# Patient Record
Sex: Female | Born: 1950 | Race: White | Hispanic: No
Health system: Midwestern US, Community
[De-identification: ages and names within clinical notes are randomized; demographics above are authoritative.]

## PROBLEM LIST (undated history)

## (undated) DIAGNOSIS — M479 Spondylosis, unspecified: Secondary | ICD-10-CM

## (undated) DIAGNOSIS — E7800 Pure hypercholesterolemia, unspecified: Secondary | ICD-10-CM

## (undated) DIAGNOSIS — I6529 Occlusion and stenosis of unspecified carotid artery: Secondary | ICD-10-CM

## (undated) DIAGNOSIS — F329 Major depressive disorder, single episode, unspecified: Secondary | ICD-10-CM

## (undated) DIAGNOSIS — I10 Essential (primary) hypertension: Secondary | ICD-10-CM

## (undated) DIAGNOSIS — R011 Cardiac murmur, unspecified: Secondary | ICD-10-CM

## (undated) DIAGNOSIS — F32A Depression, unspecified: Secondary | ICD-10-CM

## (undated) DIAGNOSIS — E079 Disorder of thyroid, unspecified: Secondary | ICD-10-CM

## (undated) DIAGNOSIS — K219 Gastro-esophageal reflux disease without esophagitis: Secondary | ICD-10-CM

## (undated) DIAGNOSIS — E785 Hyperlipidemia, unspecified: Secondary | ICD-10-CM

## (undated) HISTORY — DX: Occlusion and stenosis of unspecified carotid artery: I65.29

## (undated) HISTORY — DX: Hyperlipidemia, unspecified: E78.5

## (undated) HISTORY — DX: Pure hypercholesterolemia, unspecified: E78.00

## (undated) HISTORY — DX: Depression, unspecified: F32.A

## (undated) HISTORY — PX: OTHER SURGICAL HISTORY: SHX169

## (undated) HISTORY — PX: CHOLECYSTECTOMY: SHX55

## (undated) HISTORY — DX: Cardiac murmur, unspecified: R01.1

## (undated) HISTORY — DX: Major depressive disorder, single episode, unspecified: F32.9

## (undated) HISTORY — DX: Spondylosis, unspecified: M47.9

## (undated) HISTORY — PX: FRACTURE SURGERY: SHX138

## (undated) HISTORY — DX: Essential (primary) hypertension: I10

## (undated) HISTORY — PX: TONSILLECTOMY: SUR1361

## (undated) HISTORY — DX: Gastro-esophageal reflux disease without esophagitis: K21.9

## (undated) HISTORY — PX: APPENDECTOMY: SHX54

## (undated) HISTORY — DX: Disorder of thyroid, unspecified: E07.9

## (undated) HISTORY — PX: ADENOIDECTOMY: SUR15

---

## 1983-03-24 HISTORY — PX: ABDOMINAL HYSTERECTOMY: SHX81

## 2011-10-23 ENCOUNTER — Ambulatory Visit: Payer: Self-pay

## 2012-06-10 ENCOUNTER — Emergency Department: Payer: Self-pay | Admitting: Emergency Medicine

## 2012-06-10 LAB — BASIC METABOLIC PANEL
Chloride: 100 mmol/L (ref 98–107)
Co2: 29 mmol/L (ref 21–32)
EGFR (African American): >60
EGFR (Non-African Amer.): >60
Osmolality: 279 (ref 275–301)
Sodium: 137 mmol/L (ref 136–145)

## 2012-10-31 ENCOUNTER — Encounter: Payer: Self-pay | Admitting: Internal Medicine

## 2012-10-31 ENCOUNTER — Ambulatory Visit (INDEPENDENT_AMBULATORY_CARE_PROVIDER_SITE_OTHER): Payer: Self-pay | Admitting: Internal Medicine

## 2012-10-31 VITALS — BP 146/106 | HR 81 | Temp 98.3°F | Ht 59.0 in | Wt 161.0 lb

## 2012-10-31 DIAGNOSIS — M79659 Pain in unspecified thigh: Secondary | ICD-10-CM | POA: Insufficient documentation

## 2012-10-31 DIAGNOSIS — I10 Essential (primary) hypertension: Secondary | ICD-10-CM | POA: Insufficient documentation

## 2012-10-31 DIAGNOSIS — M79609 Pain in unspecified limb: Secondary | ICD-10-CM

## 2012-10-31 DIAGNOSIS — M542 Cervicalgia: Secondary | ICD-10-CM

## 2012-10-31 MED ORDER — AMLODIPINE BESYLATE 5 MG PO TABS: 5.0000 mg | ORAL_TABLET | Freq: Every day | ORAL | Status: DC

## 2012-10-31 MED ORDER — GABAPENTIN 100 MG PO CAPS: 100.0000 mg | ORAL_CAPSULE | Freq: Three times a day (TID) | ORAL | Status: DC

## 2012-10-31 NOTE — Assessment & Plan Note (Signed)
BP elevated. Pt has been off medications, unsure what she took in the past. Will start Amlodipine 5mg  daily. Will have her monitor BP at home. Discussed that it would be best to check renal function and urine microalbumin, however pt unable to afford labs. She will apply for [facility] for coverage for labs.

## 2012-10-31 NOTE — Assessment & Plan Note (Signed)
Bilateral thigh pain and paresthesia. History of degenerative disease of the spine. Will request previous records. Will start neurontin 100mg  po tid. Discussed additional testing including labs with B12, TSH, urine for heavy metals, however will hold off until pt has insurance coverage, as she cannot afford labs at this time.

## 2012-10-31 NOTE — Assessment & Plan Note (Signed)
Right sided neck pain and fullness over the last 3 years. Will try to obtain previous imaging. Recommended repeat CT neck to evaluate fullness and pain. Pt would prefer to hold off because of cost of imaging.

## 2012-10-31 NOTE — Progress Notes (Signed)
Subjective:    Patient ID: Hannah [name], female    DOB: September 29, 1950, 62 y.o.   MRN: [id number]  HPI 62 year old female with history of hypertension, chronic pain in her thighs presents to establish care. She has not seen a physician in several years. She has been without health insurance. She was previously on both blood pressure and cholesterol medication. She has not recently been taken any medicine. She denies any headache, chest pain, palpitations.  She reports a chronic history of back pain secondary to degenerative arthritis with bulging disc. She was previously followed by neurosurgery. She has not been having burning pain and numbness across her anterior thighs bilaterally. She has some weakness in her upper legs. She has been taking Naprosyn without improvement.  She also notes a history of fullness and swelling in her right neck over the last 3 years. This was previously evaluated with imaging however it has been several years since her evaluation. She is unsure what the cause of the swelling was. The pain sometimes radiates up into her right ear. She does not have difficulty swallowing. Swelling has not changed over last 3 years.  Outpatient Encounter Prescriptions as of 10/31/2012  Medication Sig Dispense Refill  . naproxen sodium (ALEVE) 220 MG tablet Take 220 mg by mouth 2 (two) times daily with a meal.      . omeprazole (PRILOSEC OTC) 20 MG tablet Take 20 mg by mouth daily.       No facility-administered encounter medications on file as of 10/31/2012.   BP 146/106  Pulse 81  Temp(Src) 98.3 F (36.8 C) (Oral)  Ht 4\' 11"  (1.499 m)  Wt 161 lb (73.029 kg)  BMI 32.5 kg/m2  SpO2 95%  Review of Systems  Constitutional: Negative for fever, chills, appetite change, fatigue and unexpected weight change.  HENT: Negative for ear pain, congestion, sore throat, trouble swallowing, neck pain, voice change and sinus pressure.   Eyes: Negative for visual disturbance.  Respiratory: Negative  for cough, shortness of breath, wheezing and stridor.   Cardiovascular: Negative for chest pain, palpitations and leg swelling.  Gastrointestinal: Negative for nausea, vomiting, abdominal pain, diarrhea, constipation, blood in stool, abdominal distention and anal bleeding.  Genitourinary: Negative for dysuria and flank pain.  Musculoskeletal: Positive for myalgias and arthralgias. Negative for gait problem.  Skin: Negative for color change and rash.  Neurological: Negative for dizziness and headaches.  Hematological: Negative for adenopathy. Does not bruise/bleed easily.  Psychiatric/Behavioral: Negative for suicidal ideas, sleep disturbance and dysphoric mood. The patient is not nervous/anxious.        Objective:   Physical Exam  Constitutional: [name], place, and time. She appears well-developed and well-nourished. No distress.  HENT:  Head: Normocephalic and atraumatic.  Right Ear: External ear normal.  Left Ear: External ear normal.  Nose: Nose normal.  Mouth/Throat: Oropharynx is clear and moist. No oropharyngeal exudate.  Eyes: Conjunctivae are normal. Pupils are equal, round, and reactive to light. Right eye exhibits no discharge. Left eye exhibits no discharge. No scleral icterus.  Neck: Normal range of motion. Neck supple. No tracheal tenderness present. Carotid bruit is not present. No tracheal deviation present. No mass and no thyromegaly present.    Cardiovascular: Normal rate, regular rhythm, normal heart sounds and intact distal pulses.  Exam reveals no gallop and no friction rub.   No murmur heard. Pulmonary/Chest: Effort normal and breath sounds normal. No accessory muscle usage. Not tachypneic. No respiratory distress. She has no decreased breath  sounds. She has no wheezes. She has no rhonchi. She has no rales. She exhibits no tenderness.  Abdominal: Soft. Bowel sounds are normal. She exhibits no distension. There is no tenderness.  Musculoskeletal:  Normal range of motion. She exhibits no edema and no tenderness.       Right hip: She exhibits normal range of motion and normal strength.       Left hip: She exhibits normal range of motion and normal strength.  Lymphadenopathy:    She has no cervical adenopathy.  Neurological: She is alert and oriented to person, place, and time. She has normal strength. She displays no atrophy and no tremor. No cranial nerve deficit. She exhibits normal muscle tone. Coordination and gait normal.  Skin: Skin is warm and dry. No rash noted. She is not diaphoretic. No erythema. No pallor.  Psychiatric: She has a normal mood and affect. Her behavior is normal. Judgment and thought content normal.          Assessment & Plan:

## 2012-12-05 ENCOUNTER — Ambulatory Visit: Payer: Self-pay | Admitting: Internal Medicine

## 2013-04-05 ENCOUNTER — Other Ambulatory Visit: Payer: Self-pay

## 2013-04-10 ENCOUNTER — Other Ambulatory Visit (INDEPENDENT_AMBULATORY_CARE_PROVIDER_SITE_OTHER): Payer: BC Managed Care – PPO

## 2013-04-10 ENCOUNTER — Ambulatory Visit: Payer: Self-pay | Admitting: Internal Medicine

## 2013-04-10 DIAGNOSIS — M79609 Pain in unspecified limb: Secondary | ICD-10-CM

## 2013-04-10 DIAGNOSIS — I10 Essential (primary) hypertension: Secondary | ICD-10-CM

## 2013-04-10 LAB — COMPREHENSIVE METABOLIC PANEL
ALT: 28 U/L (ref 0–35)
AST: 21 U/L (ref 0–37)
Albumin: 4.4 g/dL (ref 3.5–5.2)
Alkaline Phosphatase: 105 U/L (ref 39–117)
BUN: 20 mg/dL (ref 6–23)
CALCIUM: 9.5 mg/dL (ref 8.4–10.5)
CHLORIDE: 102 meq/L (ref 96–112)
CO2: 30 meq/L (ref 19–32)
CREATININE: 1.1 mg/dL (ref 0.4–1.2)
GFR: 51.99 mL/min — AB (ref >60.00)
GLUCOSE: 89 mg/dL (ref 70–99)
Potassium: 4.6 mEq/L (ref 3.5–5.1)
Sodium: 141 mEq/L (ref 135–145)
Total Bilirubin: 0.7 mg/dL (ref 0.3–1.2)
Total Protein: 7.1 g/dL (ref 6.0–8.3)

## 2013-04-10 LAB — LIPID PANEL
Cholesterol: 218 mg/dL — ABNORMAL HIGH (ref 0–200)
HDL: 62.3 mg/dL (ref >39.00)
Total CHOL/HDL Ratio: 3
Triglycerides: 75 mg/dL (ref 0.0–149.0)
VLDL: 15 mg/dL (ref 0.0–40.0)

## 2013-04-10 LAB — CBC WITH DIFFERENTIAL/PLATELET
Basophils Absolute: 0 10*3/uL (ref 0.0–0.1)
Basophils Relative: 0.6 % (ref 0.0–3.0)
EOS PCT: 3.2 % (ref 0.0–5.0)
Eosinophils Absolute: 0.2 10*3/uL (ref 0.0–0.7)
HEMATOCRIT: 39.2 % (ref 36.0–46.0)
Hemoglobin: 13.3 g/dL (ref 12.0–15.0)
Lymphocytes Relative: 32 % (ref 12.0–46.0)
Lymphs Abs: 2.3 10*3/uL (ref 0.7–4.0)
MCHC: 34 g/dL (ref 30.0–36.0)
MCV: 89.8 fl (ref 78.0–100.0)
MONOS PCT: 7.1 % (ref 3.0–12.0)
Monocytes Absolute: 0.5 10*3/uL (ref 0.1–1.0)
NEUTROS PCT: 57.1 % (ref 43.0–77.0)
Neutro Abs: 4.1 10*3/uL (ref 1.4–7.7)
Platelets: 321 10*3/uL (ref 150.0–400.0)
RBC: 4.36 Mil/uL (ref 3.87–5.11)
RDW: 14 % (ref 11.5–14.6)
WBC: 7.1 10*3/uL (ref 4.5–10.5)

## 2013-04-10 LAB — LDL CHOLESTEROL, DIRECT: LDL DIRECT: 137.9 mg/dL

## 2013-04-10 LAB — MICROALBUMIN / CREATININE URINE RATIO
Microalb Creat Ratio: 0.5 mg/g (ref 0.0–30.0)
Microalb, Ur: 3.2 mg/dL — ABNORMAL HIGH (ref 0.0–1.9)

## 2013-04-10 LAB — VITAMIN B12: VITAMIN B 12: 358 pg/mL (ref 211–911)

## 2013-04-10 LAB — C-REACTIVE PROTEIN: CRP: <0.5 mg/dL (ref 0.5–20.0)

## 2013-04-10 LAB — SEDIMENTATION RATE: SED RATE: 19 mm/h (ref 0–22)

## 2013-04-10 LAB — TSH: TSH: 1.16 u[IU]/mL (ref 0.35–5.50)

## 2013-04-11 LAB — ANTI-NUCLEAR AB-TITER (ANA TITER): ANA TITER 1: NEGATIVE (ref <1:40)

## 2013-04-11 LAB — RHEUMATOID FACTOR

## 2013-04-11 LAB — ANA: ANA: POSITIVE — AB

## 2013-04-14 ENCOUNTER — Encounter: Payer: Self-pay | Admitting: Internal Medicine

## 2013-04-14 ENCOUNTER — Encounter (INDEPENDENT_AMBULATORY_CARE_PROVIDER_SITE_OTHER): Payer: Self-pay

## 2013-04-14 ENCOUNTER — Ambulatory Visit (INDEPENDENT_AMBULATORY_CARE_PROVIDER_SITE_OTHER): Payer: BC Managed Care – PPO | Admitting: Internal Medicine

## 2013-04-14 VITALS — BP 120/78 | HR 75 | Temp 98.1°F | Wt 160.0 lb

## 2013-04-14 DIAGNOSIS — F411 Generalized anxiety disorder: Secondary | ICD-10-CM

## 2013-04-14 DIAGNOSIS — M79609 Pain in unspecified limb: Secondary | ICD-10-CM

## 2013-04-14 DIAGNOSIS — R41840 Attention and concentration deficit: Secondary | ICD-10-CM

## 2013-04-14 DIAGNOSIS — I10 Essential (primary) hypertension: Secondary | ICD-10-CM

## 2013-04-14 DIAGNOSIS — M79659 Pain in unspecified thigh: Secondary | ICD-10-CM

## 2013-04-14 MED ORDER — ZOSTER VACCINE LIVE 19400 UNT/0.65ML ~~LOC~~ SOLR: 0.6500 mL | Freq: Once | SUBCUTANEOUS | Status: DC

## 2013-04-14 MED ORDER — AMLODIPINE BESYLATE 5 MG PO TABS: 5.0000 mg | ORAL_TABLET | Freq: Every day | ORAL | Status: DC

## 2013-04-14 MED ORDER — GABAPENTIN 100 MG PO CAPS: 100.0000 mg | ORAL_CAPSULE | Freq: Three times a day (TID) | ORAL | Status: DC

## 2013-04-14 NOTE — Progress Notes (Signed)
Pre-visit discussion using our clinic review tool. No additional management support is needed unless otherwise documented below in the visit note.  

## 2013-04-15 DIAGNOSIS — F411 Generalized anxiety disorder: Secondary | ICD-10-CM | POA: Insufficient documentation

## 2013-04-15 DIAGNOSIS — R41840 Attention and concentration deficit: Secondary | ICD-10-CM | POA: Insufficient documentation

## 2013-04-15 NOTE — Assessment & Plan Note (Signed)
Chronic pain and paresthesias of right thigh. Lab evaluation including CBC, CMP, B12, TSH were normal. Will again request the report on MRI of lumbar spine that was performed by her previous physician. She has not been taking Neurontin. Encouraged her to restart Neurontin 100 mg 3 times daily. Will set up urology evaluation. Question if the EMG testing might be helpful to determine etiology of pain symptoms.

## 2013-04-15 NOTE — Assessment & Plan Note (Signed)
Patient with history of anxiety, previously treated with Valium. Given her age we discussed potential risk of benzodiazepines. She reports no improvement with SSRIs in the past. Will set up psychiatry evaluation to consider alternative medication which might be helpful in treating anxiety symptoms.

## 2013-04-15 NOTE — Assessment & Plan Note (Signed)
Patient notes symptoms of difficulty concentrating. Suspect this is related to severe anxiety. Will set up a psychiatric evaluation with possible testing for ADD.

## 2013-04-15 NOTE — Progress Notes (Signed)
Subjective:    Patient ID: Hannah [name], female    DOB: [birth year], 62 y.o.   MRN: [id number]  HPI 63 year old female with history of hypertension, anxiety, and chronic right thigh pain presents for followup. She recently had labs performed which showed normal CBC, CMP, B12, thyroid function. She did have MRI of lumbar spine in the past to evaluate cause of right eye pain however results of that study are not available. She reports continued aching pain down her right thigh with occasional tingling or paresthesias over her right thigh. She denies weakness in her legs, loss of control of bowel or bladder. These symptoms of right thigh pain have been ongoing for a couple of years. She was evaluated by sports medicine in the past but did not find this helpful.  She is also concerned about symptoms of anxiety. She reports in the past she was taking Valium several times per day with some improvement. She has not recently been on any medication. She also used SSRIs in the past however had no improvement with this. She reports symptoms of anxious mood and difficulty concentrating or focusing.  Outpatient Encounter Prescriptions as of 04/14/2013  Medication Sig  . amLODipine (NORVASC) 5 MG tablet Take 1 tablet (5 mg total) by mouth daily.  . naproxen sodium (ALEVE) 220 MG tablet Take 220 mg by mouth 2 (two) times daily with a meal.  . ranitidine (ZANTAC) 150 MG capsule Take 150 mg by mouth 4 (four) times daily.  . [DISCONTINUED] amLODipine (NORVASC) 5 MG tablet Take 1 tablet (5 mg total) by mouth daily.  Marland Kitchen gabapentin (NEURONTIN) 100 MG capsule Take 1 capsule (100 mg total) by mouth 3 (three) times daily.  Marland Kitchen omeprazole (PRILOSEC OTC) 20 MG tablet Take 20 mg by mouth daily.  Marland Kitchen zoster vaccine live, PF, (ZOSTAVAX) 18299 UNT/0.65ML injection Inject 19,400 Units into the skin once.  . [DISCONTINUED] gabapentin (NEURONTIN) 100 MG capsule Take 1 capsule (100 mg total) by mouth 3 (three) times daily.   BP  120/78  Pulse 75  Temp(Src) 98.1 F (36.7 C) (Oral)  Wt 160 lb (72.576 kg)  SpO2 96%  Review of Systems  Constitutional: Negative for fever, chills, appetite change, fatigue and unexpected weight change.  HENT: Negative for congestion, ear pain, sinus pressure, sore throat, trouble swallowing and voice change.   Eyes: Negative for visual disturbance.  Respiratory: Negative for cough, shortness of breath, wheezing and stridor.   Cardiovascular: Negative for chest pain, palpitations and leg swelling.  Gastrointestinal: Negative for nausea, vomiting, abdominal pain, diarrhea, constipation, blood in stool, abdominal distention and anal bleeding.  Genitourinary: Negative for dysuria and flank pain.  Musculoskeletal: Positive for myalgias. Negative for arthralgias, gait problem and neck pain.  Skin: Negative for color change and rash.  Neurological: Negative for dizziness, tremors, weakness and headaches.  Hematological: Negative for adenopathy. Does not bruise/bleed easily.  Psychiatric/Behavioral: Positive for behavioral problems and decreased concentration. Negative for suicidal ideas, sleep disturbance and dysphoric mood. The patient is nervous/anxious.        Objective:   Physical Exam  Constitutional: [name], place, and time. She appears well-developed and well-nourished. No distress.  HENT:  Head: Normocephalic and atraumatic.  Right Ear: External ear normal.  Left Ear: External ear normal.  Nose: Nose normal.  Mouth/Throat: Oropharynx is clear and moist. No oropharyngeal exudate.  Eyes: Conjunctivae are normal. Pupils are equal, round, and reactive to light. Right eye exhibits no discharge. Left eye exhibits no discharge.  No scleral icterus.  Neck: Normal range of motion. Neck supple. No tracheal deviation present. No thyromegaly present.  Cardiovascular: Normal rate, regular rhythm, normal heart sounds and intact distal pulses.  Exam reveals no gallop and no  friction rub.   No murmur heard. Pulmonary/Chest: Effort normal and breath sounds normal. No accessory muscle usage. Not tachypneic. No respiratory distress. She has no decreased breath sounds. She has no wheezes. She has no rhonchi. She has no rales. She exhibits no tenderness.  Musculoskeletal: Normal range of motion. She exhibits no edema and no tenderness.       Right upper leg: She exhibits no tenderness, no bony tenderness, no swelling and no deformity.       Legs: Lymphadenopathy:    She has no cervical adenopathy.  Neurological: She is alert and oriented to person, place, and time. No cranial nerve deficit. She exhibits normal muscle tone. Coordination normal.  Skin: Skin is warm and dry. No rash noted. She is not diaphoretic. No erythema. No pallor.  Psychiatric: Her speech is normal and behavior is normal. Judgment and thought content normal. Her mood appears anxious. Cognition and memory are normal.          Assessment & Plan:

## 2013-04-15 NOTE — Assessment & Plan Note (Signed)
BP Readings from Last 3 Encounters:  04/14/13 120/78  10/31/12 146/106   BP well controlled on amlodipine. Will continue.

## 2013-04-26 ENCOUNTER — Telehealth: Payer: Self-pay | Admitting: Internal Medicine

## 2013-04-26 NOTE — Telephone Encounter (Signed)
Relevant patient education assigned to patient using Emmi. ° °

## 2013-05-12 ENCOUNTER — Ambulatory Visit: Payer: BC Managed Care – PPO | Admitting: Internal Medicine

## 2013-05-15 ENCOUNTER — Telehealth: Payer: Self-pay | Admitting: Internal Medicine

## 2013-05-15 NOTE — Telephone Encounter (Signed)
Spoke with patient informed to call Dr. Lannie Fields office since he originally prescribed it to her. Patient verbalized understanding. While on the phone patient inquired about seeing another provider in the office for her follow up since her sister is coming in on 3/10 and Dr. Gilford Rile is not in the office that day. Informed patient other providers only does that for acute visits not follow ups.

## 2013-05-15 NOTE — Telephone Encounter (Signed)
Pt called  She was referred to dr Melrose Nakayama.  Dr Melrose Nakayama prescribe her diclofen pat  50mg   It states Do not take aleive or ibprofen  . Pt stopped taken diclofen she stated it was doing no good she started taken aleive again. Pt stated she has not called dr Weber Cooks about this  She wanted to know if she needed to call dr Weber Cooks office

## 2013-06-15 ENCOUNTER — Telehealth: Payer: Self-pay | Admitting: *Deleted

## 2013-06-15 NOTE — Telephone Encounter (Signed)
I reviewed notes from neurology. Did she follow up again with them after testing? I would recommend a follow up with them. If no improvement, we can also consider referral to pain management.

## 2013-06-15 NOTE — Telephone Encounter (Signed)
Patient left voicemail message stating she needs a follow up appointment because she is still in a lot of pain. The meds is not working and the doctor you referred her to did not find anything, so what is the next step?

## 2013-06-16 NOTE — Telephone Encounter (Signed)
Left message to call back  

## 2013-06-19 NOTE — Telephone Encounter (Signed)
Called and spoke with patient, she stated she just spoken to the neurologist office. They put her on Gabapentin 300 mg she takes 1 in the morning, 1 in the afternoon and 2 at night. She is going to see how well this works for her.

## 2013-06-21 ENCOUNTER — Telehealth: Payer: Self-pay | Admitting: Internal Medicine

## 2013-06-21 NOTE — Telephone Encounter (Signed)
Patient left voicemail requesting something called in for ringing in the ears.

## 2013-06-22 NOTE — Telephone Encounter (Signed)
Called and spoken with patient, she state she has a ringing in her ears and this is something she has always had. But over the last few weeks it has gotten worse and hard to ignore. She will come in for an appointment if nothing can be called in.

## 2013-06-22 NOTE — Telephone Encounter (Signed)
Spoke with pt, does not feel this is urgent or overly bothersome currently. Has had this for years and is tolerable until she can get an appt with Dr. Gilford Rile. Appt scheduled 07/14/13. Advised pt to call office with worsening or change in symptoms prior to this visit, verbalized understanding.

## 2013-06-22 NOTE — Telephone Encounter (Signed)
Yes, she needs to be evaluated in a visit. May need to come to Saturday clinic.

## 2013-06-30 ENCOUNTER — Telehealth: Payer: Self-pay | Admitting: Internal Medicine

## 2013-06-30 NOTE — Telephone Encounter (Signed)
FYI

## 2013-06-30 NOTE — Telephone Encounter (Signed)
Patient Information:  Caller Name: Glory  Phone: [phone]  Patient: Hannah [name], Hannah [name]  Gender: Female  DOB: 03/27/1950  Age: 63 Years  PCP: Ronette Deter (Adults only)  Office Follow Up:  Does the office need to follow up with this patient?: No  Instructions For The Office: N/A   Symptoms  Reason For Call & Symptoms: Patient calling, she had diarrhea starting on 4/5 and then flu like sx 4/9 and now today has diarrhea again.  She has had 3 or 4 diarrhea stools today.  No vomiting.  Temp 99.3.  She also has body aches, headaches and a sore throat.  States that the cough is worse and she had chest pain earlier with the coughing to the point "that I thought that I was going to pass out".  The pain is now gone.  The cough is nonproductive.  Reviewed Health History In EMR: Yes  Reviewed Medications In EMR: Yes  Reviewed Allergies In EMR: Yes  Reviewed Surgeries / Procedures: Yes  Date of Onset of Symptoms: 06/29/2013  Guideline(s) Used:  Influenza - Seasonal  Disposition Per Guideline:   Go to ED Now (or to Office with PCP Approval)  Reason For Disposition Reached:   Patient sounds very sick or weak to the triager  Advice Given:  N/A  Patient Will Follow Care Advice:  YES  Going to ED

## 2013-07-01 NOTE — Telephone Encounter (Signed)
Per note, pt going to ED.  Just FYI.

## 2013-07-14 ENCOUNTER — Encounter: Payer: Self-pay | Admitting: Internal Medicine

## 2013-07-14 ENCOUNTER — Ambulatory Visit (INDEPENDENT_AMBULATORY_CARE_PROVIDER_SITE_OTHER): Payer: BC Managed Care – PPO | Admitting: Internal Medicine

## 2013-07-14 VITALS — BP 110/74 | HR 76 | Temp 98.4°F | Wt 153.0 lb

## 2013-07-14 DIAGNOSIS — M79659 Pain in unspecified thigh: Secondary | ICD-10-CM

## 2013-07-14 DIAGNOSIS — H9319 Tinnitus, unspecified ear: Secondary | ICD-10-CM

## 2013-07-14 DIAGNOSIS — R269 Unspecified abnormalities of gait and mobility: Secondary | ICD-10-CM

## 2013-07-14 DIAGNOSIS — I10 Essential (primary) hypertension: Secondary | ICD-10-CM

## 2013-07-14 DIAGNOSIS — M79609 Pain in unspecified limb: Secondary | ICD-10-CM

## 2013-07-14 DIAGNOSIS — R2681 Unsteadiness on feet: Secondary | ICD-10-CM

## 2013-07-14 DIAGNOSIS — H9313 Tinnitus, bilateral: Secondary | ICD-10-CM

## 2013-07-14 LAB — COMPREHENSIVE METABOLIC PANEL
ALK PHOS: 85 U/L (ref 39–117)
ALT: 16 U/L (ref 0–35)
AST: 15 U/L (ref 0–37)
Albumin: 4.1 g/dL (ref 3.5–5.2)
BUN: 19 mg/dL (ref 6–23)
CO2: 27 mEq/L (ref 19–32)
Calcium: 9.8 mg/dL (ref 8.4–10.5)
Chloride: 100 mEq/L (ref 96–112)
Creatinine, Ser: 1.1 mg/dL (ref 0.4–1.2)
GFR: 53.59 mL/min — ABNORMAL LOW (ref >60.00)
GLUCOSE: 78 mg/dL (ref 70–99)
Potassium: 5.1 mEq/L (ref 3.5–5.1)
SODIUM: 136 meq/L (ref 135–145)
TOTAL PROTEIN: 7.2 g/dL (ref 6.0–8.3)
Total Bilirubin: 0.7 mg/dL (ref 0.3–1.2)

## 2013-07-14 LAB — CBC WITH DIFFERENTIAL/PLATELET
BASOS ABS: 0.1 10*3/uL (ref 0.0–0.1)
Basophils Relative: 0.7 % (ref 0.0–3.0)
Eosinophils Absolute: 0.2 10*3/uL (ref 0.0–0.7)
Eosinophils Relative: 2.7 % (ref 0.0–5.0)
HEMATOCRIT: 38 % (ref 36.0–46.0)
Hemoglobin: 12.6 g/dL (ref 12.0–15.0)
LYMPHS ABS: 2.5 10*3/uL (ref 0.7–4.0)
LYMPHS PCT: 27.9 % (ref 12.0–46.0)
MCHC: 33.2 g/dL (ref 30.0–36.0)
MCV: 91.8 fl (ref 78.0–100.0)
Monocytes Absolute: 0.9 10*3/uL (ref 0.1–1.0)
Monocytes Relative: 9.7 % (ref 3.0–12.0)
NEUTROS PCT: 59 % (ref 43.0–77.0)
Neutro Abs: 5.2 10*3/uL (ref 1.4–7.7)
PLATELETS: 433 10*3/uL — AB (ref 150.0–400.0)
RBC: 4.14 Mil/uL (ref 3.87–5.11)
RDW: 14 % (ref 11.5–14.6)
WBC: 8.8 10*3/uL (ref 4.5–10.5)

## 2013-07-14 LAB — VITAMIN B12: VITAMIN B 12: 572 pg/mL (ref 211–911)

## 2013-07-14 MED ORDER — AMLODIPINE BESYLATE 5 MG PO TABS: 5.0000 mg | ORAL_TABLET | Freq: Every day | ORAL | Status: DC

## 2013-07-14 MED ORDER — ZOSTER VACCINE LIVE 19400 UNT/0.65ML ~~LOC~~ SOLR: 0.6500 mL | Freq: Once | SUBCUTANEOUS | Status: DC

## 2013-07-14 NOTE — Progress Notes (Signed)
Subjective:    Patient ID: Hannah [name] [name], female    DOB: May 27, 1950, 63 y.o.   MRN: [id number]  HPI 63YO female presents for acute visit.  Sick around [place] with diarrhea and malaise, weakness. Also had congestion, cough and left chest wall pain. Cough productive of colored sputum initially and then cleared. Had fever initially which improved.  Tinnitis - has always had this, but at times more severe. Sounds like static. High and low pitch. Years ago had hearing testing, more than 20 years ago. No hearing aids.  Having trouble walking because of thigh pain. Continues to have burning pain in right thigh. Taking Neurontin with no improvement. Reviewed notes from Dr. Melrose Nakayama who recommended EMG testing. This has not been scheduled.   Review of Systems  Constitutional: Positive for fatigue. Negative for fever, chills, appetite change and unexpected weight change.  HENT: Positive for tinnitus. Negative for congestion, ear pain, hearing loss, sinus pressure, sore throat, trouble swallowing and voice change.   Eyes: Negative for visual disturbance.  Respiratory: Negative for cough, shortness of breath, wheezing and stridor.   Cardiovascular: Negative for chest pain, palpitations and leg swelling.  Gastrointestinal: Negative for nausea, vomiting, abdominal pain, diarrhea, constipation, blood in stool, abdominal distention and anal bleeding.  Genitourinary: Negative for dysuria and flank pain.  Musculoskeletal: Positive for arthralgias and myalgias. Negative for gait problem and neck pain.  Skin: Negative for color change and rash.  Neurological: Positive for weakness. Negative for dizziness, numbness and headaches.  Hematological: Negative for adenopathy. Does not bruise/bleed easily.  Psychiatric/Behavioral: Negative for suicidal ideas, sleep disturbance and dysphoric mood. The patient is not nervous/anxious.        Objective:    BP 110/74  Pulse 76  Temp(Src) 98.4 F (36.9 C) (Oral)   Wt 153 lb (69.4 kg)  SpO2 94% Physical Exam  Constitutional: [name], place, and time. She appears well-developed and well-nourished. No distress.  HENT:  Head: Normocephalic and atraumatic.  Right Ear: Tympanic membrane, external ear and ear canal normal.  Left Ear: Tympanic membrane, external ear and ear canal normal.  Nose: Nose normal.  Mouth/Throat: Oropharynx is clear and moist. No oropharyngeal exudate.  Eyes: Conjunctivae are normal. Pupils are equal, round, and reactive to light. Right eye exhibits no discharge. Left eye exhibits no discharge. No scleral icterus.  Neck: Normal range of motion. Neck supple. No tracheal deviation present. No thyromegaly present.  Cardiovascular: Normal rate, regular rhythm, normal heart sounds and intact distal pulses.  Exam reveals no gallop and no friction rub.   No murmur heard. Pulmonary/Chest: Effort normal and breath sounds normal. No accessory muscle usage. Not tachypneic. No respiratory distress. She has no decreased breath sounds. She has no wheezes. She has no rhonchi. She has no rales. She exhibits no tenderness.  Musculoskeletal: Normal range of motion. She exhibits no edema.       Right hip: She exhibits tenderness. She exhibits normal range of motion and normal strength.       Legs: Lymphadenopathy:    She has no cervical adenopathy.  Neurological: She is alert and oriented to person, place, and time. No cranial nerve deficit. She exhibits normal muscle tone. Coordination normal.  Skin: Skin is warm and dry. No rash noted. She is not diaphoretic. No erythema. No pallor.  Psychiatric: She has a normal mood and affect. Her behavior is normal. Judgment and thought content normal.          Assessment & Plan:  Problem List Items Addressed This Visit   Essential hypertension, benign      BP Readings from Last 3 Encounters:  07/14/13 110/74  04/14/13 120/78  10/31/12 146/106   BP well controlled on Amlodipine.  Will continue.    Relevant Medications      amLODIpine (NORVASC) tablet   Gait instability     Gait instability noted with severe pain in right thigh. Rx written for [place] Maiorino and cane for falls prevention. Follow up with neurology as scheduled.    Thigh pain     Persistent right>left thigh pain described as burning. Reviewed notes from Neurology, who recommended EMG testing. Will have her schedule follow up with them for this. Continue Neurontin.    Relevant Orders      Heavy metals screen, urine      CBC with Differential      Methylmalonic Acid      B12      Comp Met (CMET)   Tinnitus of both ears - Primary     Unclear etiology ringing in both ears. Exam normal. Will set up ENT/audiology evaluation.    Relevant Orders      Ambulatory referral to ENT       Return in about 4 weeks (around 08/11/2013).

## 2013-07-14 NOTE — Patient Instructions (Signed)
Call Dr. Melrose Nakayama to schedule follow up for possible EMG testing to determine cause of right leg pain.  We will set up evaluation with ENT.  Follow up in 4 weeks.

## 2013-07-14 NOTE — Assessment & Plan Note (Signed)
Unclear etiology ringing in both ears. Exam normal. Will set up ENT/audiology evaluation.

## 2013-07-14 NOTE — Progress Notes (Signed)
Pre visit review using our clinic review tool, if applicable. No additional management support is needed unless otherwise documented below in the visit note. 

## 2013-07-14 NOTE — Assessment & Plan Note (Signed)
Gait instability noted with severe pain in right thigh. Rx written for walker and cane for falls prevention. Follow up with neurology as scheduled.

## 2013-07-14 NOTE — Assessment & Plan Note (Signed)
Persistent right>left thigh pain described as burning. Reviewed notes from Neurology, who recommended EMG testing. Will have her schedule follow up with them for this. Continue Neurontin.

## 2013-07-14 NOTE — Assessment & Plan Note (Signed)
BP Readings from Last 3 Encounters:  07/14/13 110/74  04/14/13 120/78  10/31/12 146/106   BP well controlled on Amlodipine. Will continue.

## 2013-07-17 ENCOUNTER — Encounter: Payer: Self-pay | Admitting: *Deleted

## 2013-07-18 ENCOUNTER — Encounter: Payer: Self-pay | Admitting: *Deleted

## 2013-07-18 LAB — METHYLMALONIC ACID, SERUM: Methylmalonic Acid, Quant: 263 nmol/L (ref 87–318)

## 2013-08-04 ENCOUNTER — Telehealth: Payer: Self-pay | Admitting: *Deleted

## 2013-08-04 NOTE — Telephone Encounter (Signed)
No. She should have 24hr urine.

## 2013-08-04 NOTE — Telephone Encounter (Signed)
Pt brought in a two urine samples, they only order i see if for the 24 hours urine? Are these samples for something different?

## 2013-08-08 DIAGNOSIS — R200 Anesthesia of skin: Secondary | ICD-10-CM | POA: Insufficient documentation

## 2013-08-08 DIAGNOSIS — R202 Paresthesia of skin: Secondary | ICD-10-CM | POA: Insufficient documentation

## 2013-08-08 DIAGNOSIS — G723 Periodic paralysis: Secondary | ICD-10-CM | POA: Insufficient documentation

## 2013-08-08 DIAGNOSIS — R531 Weakness: Secondary | ICD-10-CM | POA: Insufficient documentation

## 2013-08-08 DIAGNOSIS — M79609 Pain in unspecified limb: Secondary | ICD-10-CM | POA: Insufficient documentation

## 2013-08-08 DIAGNOSIS — R262 Difficulty in walking, not elsewhere classified: Secondary | ICD-10-CM | POA: Insufficient documentation

## 2013-08-11 DIAGNOSIS — M549 Dorsalgia, unspecified: Secondary | ICD-10-CM | POA: Insufficient documentation

## 2013-08-30 ENCOUNTER — Telehealth: Payer: Self-pay | Admitting: *Deleted

## 2013-08-30 NOTE — Telephone Encounter (Signed)
Pt in office today, asking about results from heavy metal urine test. Results not in system, spoke to [place], wrong urine container was given to patient so she needs to return to the office to pick up a 24 hour urine container so that the test can be performed. Left message, notifying pt to return to pick up correct container.

## 2013-09-08 ENCOUNTER — Encounter: Payer: BC Managed Care – PPO | Admitting: Internal Medicine

## 2013-12-05 ENCOUNTER — Ambulatory Visit (INDEPENDENT_AMBULATORY_CARE_PROVIDER_SITE_OTHER): Payer: Self-pay

## 2013-12-05 DIAGNOSIS — Z23 Encounter for immunization: Secondary | ICD-10-CM

## 2013-12-07 ENCOUNTER — Ambulatory Visit: Payer: Self-pay

## 2014-02-08 ENCOUNTER — Encounter: Payer: Self-pay | Admitting: Internal Medicine

## 2014-02-08 ENCOUNTER — Ambulatory Visit (INDEPENDENT_AMBULATORY_CARE_PROVIDER_SITE_OTHER): Payer: Self-pay | Admitting: Internal Medicine

## 2014-02-08 VITALS — BP 146/83 | Temp 98.6°F | Resp 83 | Ht 59.0 in | Wt 157.2 lb

## 2014-02-08 DIAGNOSIS — M79604 Pain in right leg: Secondary | ICD-10-CM

## 2014-02-08 DIAGNOSIS — Z1239 Encounter for other screening for malignant neoplasm of breast: Secondary | ICD-10-CM

## 2014-02-08 MED ORDER — GABAPENTIN 300 MG PO CAPS: 300.0000 mg | ORAL_CAPSULE | Freq: Three times a day (TID) | ORAL | Status: DC

## 2014-02-08 NOTE — Progress Notes (Signed)
Pre visit review using our clinic review tool, if applicable. No additional management support is needed unless otherwise documented below in the visit note. 

## 2014-02-08 NOTE — Progress Notes (Signed)
Subjective:    Patient ID: Hannah [name], female    DOB: 10/04/1950, 63 y.o.   MRN: [id number]  HPI 63YO female presents for follow up.  Having severe right leg pain. Described as burning pain. Sometimes so bad it makes her feel sick.Feels weak at times. Also having pain in right upper arm at times. Taking Aleve and Neurontin with no improvement. Tried to take Tramadol, but could not tolerate it. No loss of control of bowel or bladder.  Recently had biopsy of thyroid nodule which was benign.  Followed by Dr. Nicolasa Ducking. Depression symptoms well controlled.  Review of Systems  Constitutional: Negative for fever, chills, appetite change, fatigue and unexpected weight change.  Eyes: Negative for visual disturbance.  Respiratory: Negative for shortness of breath.   Cardiovascular: Negative for chest pain and leg swelling.  Gastrointestinal: Negative for abdominal pain.  Musculoskeletal: Positive for myalgias and arthralgias. Negative for back pain.  Skin: Negative for color change and rash.  Neurological: Positive for weakness and numbness.  Hematological: Negative for adenopathy. Does not bruise/bleed easily.  Psychiatric/Behavioral: Negative for dysphoric mood. The patient is not nervous/anxious.        Objective:    BP 146/83 mmHg  Temp(Src) 98.6 F (37 C) (Oral)  Resp 83  Ht 4\' 11"  (1.499 m)  Wt 157 lb 4 oz (71.328 kg)  BMI 31.74 kg/m2  SpO2 96% Physical Exam  Constitutional: [name], place, and time. She appears well-developed and well-nourished. No distress.  HENT:  Head: Normocephalic and atraumatic.  Right Ear: External ear normal.  Left Ear: External ear normal.  Nose: Nose normal.  Mouth/Throat: Oropharynx is clear and moist. No oropharyngeal exudate.  Eyes: Conjunctivae are normal. Pupils are equal, round, and reactive to light. Right eye exhibits no discharge. Left eye exhibits no discharge. No scleral icterus.  Neck: Normal range of motion.  Neck supple. No tracheal deviation present. No thyromegaly present.  Cardiovascular: Normal rate, regular rhythm, normal heart sounds and intact distal pulses.  Exam reveals no gallop and no friction rub.   No murmur heard. Pulmonary/Chest: Effort normal and breath sounds normal. No accessory muscle usage. No tachypnea. No respiratory distress. She has no decreased breath sounds. She has no wheezes. She has no rhonchi. She has no rales. She exhibits no tenderness.  Musculoskeletal: Normal range of motion. She exhibits no edema or tenderness.       Right hip: She exhibits normal range of motion, normal strength and no tenderness.  Knee flex/ext 4/5  Lymphadenopathy:    She has no cervical adenopathy.  Neurological: She is alert and oriented to person, place, and time. No cranial nerve deficit. She exhibits normal muscle tone. Coordination normal.  Skin: Skin is warm and dry. No rash noted. She is not diaphoretic. No erythema. No pallor.  Psychiatric: She has a normal mood and affect. Her behavior is normal. Judgment and thought content normal.          Assessment & Plan:   Problem List Items Addressed This Visit      Unprioritized   Right leg pain - Primary    Severe right leg pain likely secondary to bulging disc L5 (reported by Dr. Melrose Nakayama review of MRI lumbar spine). Will request copy of MRI. Will increase Neurontin to 300mg  tid. Will set up evaluation with pain management, as pt does not want to consider surgical intervention. Follow up here in 3 months and prn.    Relevant Orders  Ambulatory referral to Pain Clinic    Other Visit Diagnoses    Screening for breast cancer        Relevant Orders       MM Digital Screening        Return in about 3 months (around 05/11/2014).

## 2014-02-08 NOTE — Assessment & Plan Note (Signed)
Severe right leg pain likely secondary to bulging disc L5 (reported by Dr. Melrose Nakayama review of MRI lumbar spine). Will request copy of MRI. Will increase Neurontin to 300mg  tid. Will set up evaluation with pain management, as pt does not want to consider surgical intervention. Follow up here in 3 months and prn.

## 2014-02-08 NOTE — Patient Instructions (Addendum)
We will set up an evaluation with pain management.  Increase Neurontin to 300mg  three times daily.  Follow up in 3 months.

## 2014-03-27 ENCOUNTER — Ambulatory Visit: Payer: Self-pay | Admitting: Pain Medicine

## 2014-03-28 ENCOUNTER — Encounter: Payer: Self-pay | Admitting: *Deleted

## 2014-03-28 ENCOUNTER — Ambulatory Visit: Payer: Self-pay | Admitting: Internal Medicine

## 2014-03-28 LAB — HM MAMMOGRAPHY: HM Mammogram: NEGATIVE

## 2014-04-04 ENCOUNTER — Ambulatory Visit: Payer: Self-pay | Admitting: Pain Medicine

## 2014-04-25 ENCOUNTER — Encounter: Payer: Self-pay | Admitting: Internal Medicine

## 2014-05-03 ENCOUNTER — Ambulatory Visit: Payer: Self-pay | Admitting: Pain Medicine

## 2014-05-14 ENCOUNTER — Encounter (INDEPENDENT_AMBULATORY_CARE_PROVIDER_SITE_OTHER): Payer: Self-pay

## 2014-05-14 ENCOUNTER — Encounter: Payer: Self-pay | Admitting: Internal Medicine

## 2014-05-14 ENCOUNTER — Ambulatory Visit (INDEPENDENT_AMBULATORY_CARE_PROVIDER_SITE_OTHER): Payer: 59 | Admitting: Internal Medicine

## 2014-05-14 VITALS — BP 107/68 | HR 82 | Temp 98.4°F | Ht 59.0 in | Wt 159.1 lb

## 2014-05-14 DIAGNOSIS — M79604 Pain in right leg: Secondary | ICD-10-CM

## 2014-05-14 DIAGNOSIS — I10 Essential (primary) hypertension: Secondary | ICD-10-CM

## 2014-05-14 LAB — COMPREHENSIVE METABOLIC PANEL
ALT: 12 U/L (ref 0–35)
AST: 15 U/L (ref 0–37)
Albumin: 4.1 g/dL (ref 3.5–5.2)
Alkaline Phosphatase: 90 U/L (ref 39–117)
BUN: 20 mg/dL (ref 6–23)
CO2: 31 mEq/L (ref 19–32)
CREATININE: 1.19 mg/dL (ref 0.40–1.20)
Calcium: 9.5 mg/dL (ref 8.4–10.5)
Chloride: 104 mEq/L (ref 96–112)
GFR: 48.81 mL/min — ABNORMAL LOW (ref >60.00)
Glucose, Bld: 85 mg/dL (ref 70–99)
POTASSIUM: 4.9 meq/L (ref 3.5–5.1)
SODIUM: 140 meq/L (ref 135–145)
Total Bilirubin: 0.4 mg/dL (ref 0.2–1.2)
Total Protein: 6.5 g/dL (ref 6.0–8.3)

## 2014-05-14 LAB — HM PAP SMEAR

## 2014-05-14 MED ORDER — GABAPENTIN 300 MG PO CAPS: 600.0000 mg | ORAL_CAPSULE | Freq: Three times a day (TID) | ORAL | Status: DC

## 2014-05-14 MED ORDER — AMLODIPINE BESYLATE 5 MG PO TABS: 5.0000 mg | ORAL_TABLET | Freq: Every day | ORAL | Status: DC

## 2014-05-14 NOTE — Patient Instructions (Addendum)
Labs today.  Increase Neurontin to 600mg  three times daily. As you increase, please note that you may feel more drowsy.

## 2014-05-14 NOTE — Assessment & Plan Note (Signed)
BP Readings from Last 3 Encounters:  05/14/14 107/68  02/08/14 146/83  07/14/13 110/74   BP well controlled. Renal function with labs. Continue Amlodipine.

## 2014-05-14 NOTE — Progress Notes (Signed)
Subjective:    Patient ID: Hannah [name], female    DOB: 24-May-1950, 64 y.o.   MRN: [id number]  HPI  64YO female presents for follow up.  Last seen 01/2014 for right leg pain. We increased Neurontin to 300mg .  Seen by pain management. Had ESI with minimal improvement. Planning to have second injection this week with Dr. Primus Bravo. Continues to take Gabapentin, but no improvement in pain. Pain described as burning. Hurts in right groin and in knees. Leg feels like it will give out on her at times. No recent falls, however. Considering additional medication with Dr. Primus Bravo.  Past medical, surgical, family and social history per today's encounter.  Review of Systems  Constitutional: Negative for fever, chills, appetite change, fatigue and unexpected weight change.  Eyes: Negative for visual disturbance.  Respiratory: Negative for shortness of breath.   Cardiovascular: Negative for chest pain and leg swelling.  Gastrointestinal: Negative for abdominal pain.  Musculoskeletal: Positive for myalgias, back pain and arthralgias.  Skin: Negative for color change and rash.  Neurological: Positive for weakness. Negative for numbness.  Hematological: Negative for adenopathy. Does not bruise/bleed easily.  Psychiatric/Behavioral: Negative for dysphoric mood. The patient is not nervous/anxious.        Objective:    BP 107/68 mmHg  Pulse 82  Temp(Src) 98.4 F (36.9 C) (Oral)  Ht 4\' 11"  (1.499 m)  Wt 159 lb 2 oz (72.179 kg)  BMI 32.12 kg/m2  SpO2 95% Physical Exam  Constitutional: [name], place, and time. She appears well-developed and well-nourished. No distress.  HENT:  Head: Normocephalic and atraumatic.  Right Ear: External ear normal.  Left Ear: External ear normal.  Nose: Nose normal.  Mouth/Throat: Oropharynx is clear and moist. No oropharyngeal exudate.  Eyes: Conjunctivae are normal. Pupils are equal, round, and reactive to light. Right eye exhibits no  discharge. Left eye exhibits no discharge. No scleral icterus.  Neck: Normal range of motion. Neck supple. No tracheal deviation present. No thyromegaly present.  Cardiovascular: Normal rate, regular rhythm, normal heart sounds and intact distal pulses.  Exam reveals no gallop and no friction rub.   No murmur heard. Pulmonary/Chest: Effort normal and breath sounds normal. No respiratory distress. She has no wheezes. She has no rales. She exhibits no tenderness.  Musculoskeletal: Normal range of motion. She exhibits no edema or tenderness.       Right hip: She exhibits decreased strength (4/5). She exhibits normal range of motion.  Unsteady gait and weakness noted right leg, however exam limited by pain  Lymphadenopathy:    She has no cervical adenopathy.  Neurological: She is alert and oriented to person, place, and time. No cranial nerve deficit. She exhibits normal muscle tone. Coordination normal.  Skin: Skin is warm and dry. No rash noted. She is not diaphoretic. No erythema. No pallor.  Psychiatric: She has a normal mood and affect. Her behavior is normal. Judgment and thought content normal.          Assessment & Plan:   Problem List Items Addressed This Visit      Unprioritized   Essential hypertension, benign    BP Readings from Last 3 Encounters:  05/14/14 107/68  02/08/14 146/83  07/14/13 110/74   BP well controlled. Renal function with labs. Continue Amlodipine.      Relevant Medications   amLODIpine (NORVASC) tablet   Other Relevant Orders   Comprehensive metabolic panel   Right leg pain - Primary  Persistent right leg pain secondary to radiculopathy L5, based on previous MRI. S/p ESI x 1 with no improvement. Scheduled for second ESI this week. Will increase Neurontin to 600mg  po tid. Discussed risk of Neurontin including drowsiness. Follow up with Dr. Primus Bravo as scheduled.      Relevant Medications   gabapentin (NEURONTIN) capsule       Return in about 3  months (around 08/12/2014) for Physical.

## 2014-05-14 NOTE — Assessment & Plan Note (Addendum)
Persistent right leg pain secondary to radiculopathy L5, based on previous MRI. S/p ESI x 1 with no improvement. Scheduled for second ESI this week. Will increase Neurontin to 600mg  po tid. Discussed risk of Neurontin including drowsiness. Follow up with Dr. Primus Bravo as scheduled.

## 2014-05-14 NOTE — Progress Notes (Signed)
Pre visit review using our clinic review tool, if applicable. No additional management support is needed unless otherwise documented below in the visit note. 

## 2014-05-16 ENCOUNTER — Ambulatory Visit: Payer: Self-pay | Admitting: Pain Medicine

## 2014-06-14 ENCOUNTER — Ambulatory Visit: Payer: Self-pay | Admitting: Pain Medicine

## 2014-06-27 ENCOUNTER — Ambulatory Visit
Admit: 2014-06-27 | Disposition: A | Discharge status: Home / Self Care | Payer: Self-pay | Attending: Pain Medicine | Admitting: Pain Medicine

## 2014-07-05 ENCOUNTER — Ambulatory Visit (INDEPENDENT_AMBULATORY_CARE_PROVIDER_SITE_OTHER): Payer: 59 | Admitting: Nurse Practitioner

## 2014-07-05 ENCOUNTER — Encounter: Payer: Self-pay | Admitting: Nurse Practitioner

## 2014-07-05 VITALS — BP 140/84 | HR 69 | Temp 98.6°F | Resp 12 | Ht 59.0 in | Wt 154.8 lb

## 2014-07-05 DIAGNOSIS — R252 Cramp and spasm: Secondary | ICD-10-CM | POA: Diagnosis not present

## 2014-07-05 MED ORDER — OMEPRAZOLE 40 MG PO CPDR: 40.0000 mg | DELAYED_RELEASE_CAPSULE | Freq: Every day | ORAL | Status: DC

## 2014-07-05 NOTE — Progress Notes (Signed)
Pre visit review using our clinic review tool, if applicable. No additional management support is needed unless otherwise documented below in the visit note. 

## 2014-07-05 NOTE — Progress Notes (Signed)
Subjective:    Patient ID: Hannah [name], female    DOB: 1950/05/19, 64 y.o.   MRN: [id number]  HPI  Hannah [name] is a 64 yo female with a CC of cramps- all over.   1) Patient reports cramps in her waist, hips, calf, lateral lower leg Stopped risperdal over a month ago and stopped seeing Dr. Nicolasa Ducking Drinks only a cup of water a day, coke the rest of the day Zantac taking 3 pills multiple times a day for help with acid reflux Patient could not tell me how long this has been going on.    Review of Systems  Constitutional: Positive for chills and diaphoresis. Negative for fever and fatigue.  Eyes: Negative for visual disturbance.  Gastrointestinal: Positive for nausea and diarrhea. Negative for vomiting and blood in stool.  Endocrine: Positive for cold intolerance and heat intolerance.  Genitourinary: Negative for difficulty urinating.  Musculoskeletal: Positive for back pain and neck pain.  Skin: Negative for color change and rash.  Neurological: Positive for numbness. Negative for tremors and seizures.  Hematological: Bruises/bleeds easily.  Psychiatric/Behavioral: Positive for hallucinations and sleep disturbance. Negative for suicidal ideas. The patient is nervous/anxious and is hyperactive.    Past Medical History  Diagnosis Date  . GERD (gastroesophageal reflux disease)   . Degenerative joint disease of spine   . Hypertension   . Hyperlipidemia   . Depression     Followed in past by Dr. Randel Books    History   Social History  . Marital Status: Widowed    Spouse Name: N/A  . Number of Children: N/A  . Years of Education: N/A   Occupational History  . Not on file.   Social History Main Topics  . Smoking status: Never Smoker   . Smokeless tobacco: Never Used  . Alcohol Use: No  . Drug Use: No  . Sexual Activity: Not on file   Other Topics Concern  . Not on file   Social History Narrative   Lives with nephew in [place]. Helps with care of 2  grand-nephews.   Husband deceased.      Work - previously as Geophysicist/field seismologist    Past Surgical History  Procedure Laterality Date  . Abdominal hysterectomy    . Cholecystectomy    . Appendectomy    . Tonsilectomy/adenoidectomy with myringotomy    . Vaginal delivery      Family History  Problem Relation Age of Onset  . Hypertension Mother   . Hyperlipidemia Mother   . Heart disease Mother   . Cancer Sister     breast    No Known Allergies  Current Outpatient Prescriptions on File Prior to Visit  Medication Sig Dispense Refill  . amLODipine (NORVASC) 5 MG tablet Take 1 tablet (5 mg total) by mouth daily. 90 tablet 3  . clonazePAM (KLONOPIN) 0.5 MG tablet Take 0.5 mg by mouth daily. Take once a day as needed    . gabapentin (NEURONTIN) 300 MG capsule Take 2 capsules (600 mg total) by mouth 3 (three) times daily. 180 capsule 3  . naproxen sodium (ALEVE) 220 MG tablet Take 220 mg by mouth 2 (two) times daily with a meal.     No current facility-administered medications on file prior to visit.      Objective:   Physical Exam  Constitutional: [name], place, and time. She appears well-developed and well-nourished. No distress.  BP 140/84 mmHg  Pulse 69  Temp(Src) 98.6  F (37 C) (Oral)  Resp 12  Ht 4\' 11"  (1.499 m)  Wt 154 lb 12.8 oz (70.217 kg)  BMI 31.25 kg/m2  SpO2 99%   HENT:  Head: Normocephalic and atraumatic.  Right Ear: External ear normal.  Left Ear: External ear normal.  Eyes: Right eye exhibits no discharge. Left eye exhibits no discharge. No scleral icterus.  Cardiovascular: Normal rate, regular rhythm, normal heart sounds and intact distal pulses.  Exam reveals no gallop and no friction rub.   No murmur heard. Pulmonary/Chest: Effort normal and breath sounds normal. No respiratory distress. She has no wheezes. She has no rales. She exhibits no tenderness.  Musculoskeletal: She exhibits no edema or tenderness.  Neurological: She is  alert and oriented to person, place, and time. No cranial nerve deficit. She exhibits normal muscle tone. Coordination normal.  Romberg negative, heel/toe/sequential walking intact, DTR's upper and lower extremities 2+, tone 5/5 upper and lower extremities (two muscle groups of each tested), sensation intact in multiple dermatomes  Skin: Skin is warm and dry. No rash noted. She is not diaphoretic.  Psychiatric: She has a normal mood and affect. Her behavior is normal. Judgment and thought content normal.      Assessment & Plan:

## 2014-07-05 NOTE — Patient Instructions (Addendum)
Try drinking more water throughout the day.  Watch caffeine (soda, coffee, chocolate, tea) since this dehydrates  Try the prilosec instead of the Zantac.   Stretch daily.   Try unisom for sleep. This is over the counter.   Follow up in 1 month.

## 2014-07-15 DIAGNOSIS — R252 Cramp and spasm: Secondary | ICD-10-CM | POA: Insufficient documentation

## 2014-07-15 NOTE — Assessment & Plan Note (Signed)
Uncontrolled. Pt does not want Neuro eval at this time. She is currently on gabapentin. Recently stopped her risperdal. Asked pt to try prilosec instead of Zantac and stay hydrated with non-caffeinated beverages. Will follow up next month.

## 2014-07-31 ENCOUNTER — Ambulatory Visit: Payer: 59 | Admitting: Pain Medicine

## 2014-08-09 ENCOUNTER — Ambulatory Visit: Payer: 59 | Admitting: Internal Medicine

## 2014-09-04 ENCOUNTER — Encounter: Payer: Self-pay | Admitting: Pain Medicine

## 2014-09-04 ENCOUNTER — Ambulatory Visit: Payer: 59 | Attending: Pain Medicine | Admitting: Pain Medicine

## 2014-09-04 VITALS — BP 147/77 | HR 92 | Temp 98.0°F | Resp 16 | Ht 60.0 in | Wt 162.0 lb

## 2014-09-04 DIAGNOSIS — M545 Low back pain: Secondary | ICD-10-CM | POA: Insufficient documentation

## 2014-09-04 DIAGNOSIS — M546 Pain in thoracic spine: Secondary | ICD-10-CM | POA: Diagnosis present

## 2014-09-04 DIAGNOSIS — M79659 Pain in unspecified thigh: Secondary | ICD-10-CM

## 2014-09-04 DIAGNOSIS — M5126 Other intervertebral disc displacement, lumbar region: Secondary | ICD-10-CM | POA: Insufficient documentation

## 2014-09-04 DIAGNOSIS — M4806 Spinal stenosis, lumbar region: Secondary | ICD-10-CM | POA: Diagnosis not present

## 2014-09-04 DIAGNOSIS — M79604 Pain in right leg: Secondary | ICD-10-CM

## 2014-09-04 DIAGNOSIS — M5136 Other intervertebral disc degeneration, lumbar region: Secondary | ICD-10-CM | POA: Diagnosis not present

## 2014-09-04 DIAGNOSIS — M533 Sacrococcygeal disorders, not elsewhere classified: Secondary | ICD-10-CM | POA: Diagnosis not present

## 2014-09-04 DIAGNOSIS — M4804 Spinal stenosis, thoracic region: Secondary | ICD-10-CM | POA: Insufficient documentation

## 2014-09-04 DIAGNOSIS — M48062 Spinal stenosis, lumbar region with neurogenic claudication: Secondary | ICD-10-CM

## 2014-09-04 DIAGNOSIS — R252 Cramp and spasm: Secondary | ICD-10-CM

## 2014-09-04 DIAGNOSIS — M47814 Spondylosis without myelopathy or radiculopathy, thoracic region: Secondary | ICD-10-CM | POA: Insufficient documentation

## 2014-09-04 DIAGNOSIS — M51369 Other intervertebral disc degeneration, lumbar region without mention of lumbar back pain or lower extremity pain: Secondary | ICD-10-CM

## 2014-09-04 DIAGNOSIS — M47816 Spondylosis without myelopathy or radiculopathy, lumbar region: Secondary | ICD-10-CM

## 2014-09-04 NOTE — Progress Notes (Signed)
   Subjective:    Patient ID: Hannah [name], female    DOB: March 16, 1951, 64 y.o.   MRN: [id number]  HPI  Patient is 64 year old female returns to [name] for further evaluation and treatment of pain involving the region of the mid and lower back and lower extremity regions. Patientreturn of her pain at this time which patient states is aggravated by standing walking twisting turning maneuvers. Patient denies trauma change in events of daily living the call significant change in symptomatology. Patient states the pain is aggravated by prolonged standing walking and the pain interferes with ability to obtain restful sleep as well. We discussed patient's condition and will consider patient for lumbar facet, medial branch nerve, blocks at time return appointment. Patient is understanding and agreed suggested treatment plan.     Review of Systems     Objective:   Physical Exam There was tenderness to palpation of the splenius capitis and occipitalis musculature region of mild degree and there was mild tenderness of the cervical facet cervical paraspinal musculature region. Palpation over the acromioclavicular glenohumeral joint region was with mild tends palpation and patient appeared to be with bilaterally equal grip strength with Tinel and Phalen's maneuver without increase of pain of significant degree. Palpation over the thoracic facet thoracic paraspinal musculature region was a tends to palpation of moderate degree in the lower thoracic region with no crepitus of the thoracic region haven't been noted. Palpation over the lumbar paraspinal musculature region lumbar facet region associated tends to palpation moderate moderately severe degree. Lateral bending and rotation and extension and palpation of the lumbar facets reproduce moderate to moderately severe discomfort. Straight leg raising was tolerates approximately 30 without increased pain with dorsiflexion noted. There was  moderate tends of the PSIS and PII S region as well as the gluteal and piriformis muscles regions. There was tenderness along the greater trochanteric region iliotibial band region of mild degree. No sensory deficit of dermatomal distribution detected. Abdomen nontender and no costovertebral angle tenderness was noted.      Assessment & Plan:  Degenerative disc disease lumbar spine Kyphotic curvature at the 1112 disc space, spondylosis and shallow disc bulge at this level resulting in mild canal and moderate bilateral foraminal stenosis and mild anterior and superior T12 upper endplate depression, lack of marrow edema indicative of chronic change with mild osseous and disc changes at the remaining levels with adrenal nodule. Disc desiccation central right with the central disc bulge resulting and right foraminal narrowing L3-4 level with moderate left and right facet arthrosis. L4-L5 disc desiccation. Shallow broad-based disc bulging and narrowing of the central canal and left foramen at L4-L5 level  Lumbar facet syndrome  Lumbar stenosis  Sacroiliac joint dysfunction  Impression   Plan  Continue present medications.  Lumbar facet, medial branch nerve, blocks to be performed at time return appointment  F/U PCP for evaliation of  BP and general medical  Condition.  Psych evaluation scheduled for evaluation of elements of depression  F/U surgical evaluation. Neurosurgical reevaluation as scheduled  F/U neurological evaluation.  May consider radiofrequency rhizolysis or intraspinal procedures pending response to present treatment and F/U evaluation.  Patient to call [facility] should patient have concerns prior to scheduled return appointment.

## 2014-09-04 NOTE — Progress Notes (Signed)
   Subjective:    Patient ID: Hannah [name], female    DOB: February 06, 1951, 64 y.o.   MRN: [id number]  HPI    Review of Systems     Objective:   Physical Exam        Assessment & Plan:

## 2014-09-04 NOTE — Progress Notes (Signed)
Safety precautions to be maintained throughout the outpatient stay will include: orient to surroundings, keep bed in low position, maintain call bell within reach at all times, provide assistance with transfer out of bed and ambulation.  Discharge patient home amublatory 1042 hrs Procedure instructions given ; teach back 3 done

## 2014-09-04 NOTE — Patient Instructions (Addendum)
Continue present medications.  Lumbar facet blocks 09/19/2014  F/U PCP for evaliation of  BP and general medical  condition.  F/U surgical evaluation please ask Caryl Pina the date of your neurosurgical evaluation  Please ask Caryl Pina the date of your psych evaluation  F/U neurological evaluation.  May consider radiofrequency rhizolysis or intraspinal procedures pending response to present treatment and F/U evaluation.  Patient to call [facility] should patient have concerns prior to scheduled return appointment. GENERAL RISKS AND COMPLICATIONS  What are the risk, side effects and possible complications? Generally speaking, most procedures are safe.  However, with any procedure there are risks, side effects, and the possibility of complications.  The risks and complications are dependent upon the sites that are lesioned, or the type of nerve block to be performed.  The closer the procedure is to the spine, the more serious the risks are.  Great care is taken when placing the radio frequency needles, block needles or lesioning probes, but sometimes complications can occur. 1. Infection: Any time there is an injection through the skin, there is a risk of infection.  This is why sterile conditions are used for these blocks.  There are four possible types of infection. 1. Localized skin infection. 2. Central Nervous System Infection-This can be in the form of Meningitis, which can be deadly. 3. Epidural Infections-This can be in the form of an epidural abscess, which can cause pressure inside of the spine, causing compression of the spinal cord with subsequent paralysis. This would require an emergency surgery to decompress, and there are no guarantees that the patient would recover from the paralysis. 4. Discitis-This is an infection of the intervertebral discs.  It occurs in about 1% of discography procedures.  It is difficult to treat and it may lead to surgery.        2. Pain: the  needles have to go through skin and soft tissues, will cause soreness.       3. Damage to internal structures:  The nerves to be lesioned may be near blood vessels or    other nerves which can be potentially damaged.       4. Bleeding: Bleeding is more common if the patient is taking blood thinners such as  aspirin, Coumadin, Ticiid, Plavix, etc., or if he/she have some genetic predisposition  such as hemophilia. Bleeding into the spinal canal can cause compression of the spinal  cord with subsequent paralysis.  This would require an emergency surgery to  decompress and there are no guarantees that the patient would recover from the  paralysis.       5. Pneumothorax:  Puncturing of a lung is a possibility, every time a needle is introduced in  the area of the chest or upper back.  Pneumothorax refers to free air around the  collapsed lung(s), inside of the thoracic cavity (chest cavity).  Another two possible  complications related to a similar event would include: Hemothorax and Chylothorax.   These are variations of the Pneumothorax, where instead of air around the collapsed  lung(s), you may have blood or chyle, respectively.       6. Spinal headaches: They may occur with any procedures in the area of the spine.       7. Persistent CSF (Cerebro-Spinal Fluid) leakage: This is a rare problem, but may occur  with prolonged intrathecal or epidural catheters either due to the formation of a fistulous  track or a dural tear.  8. Nerve damage: By working so close to the spinal cord, there is always a possibility of  nerve damage, which could be as serious as a permanent spinal cord injury with  paralysis.       9. Death:  Although rare, severe deadly allergic reactions known as "Anaphylactic  reaction" can occur to any of the medications used.      10. Worsening of the symptoms:  We can always make thing worse.  What are the chances of something like this happening? Chances of any of this occuring are  extremely low.  By statistics, you have more of a chance of getting killed in a motor vehicle accident: while driving to the hospital than any of the above occurring .  Nevertheless, you should be aware that they are possibilities.  In general, it is similar to taking a shower.  Everybody knows that you can slip, hit your head and get killed.  Does that mean that you should not shower again?  Nevertheless always keep in mind that statistics do not mean anything if you happen to be on the wrong side of them.  Even if a procedure has a 1 (one) in a 1,000,000 (million) chance of going wrong, it you happen to be that one..Also, keep in mind that by statistics, you have more of a chance of having something go wrong when taking medications.  Who should not have this procedure? If you are on a blood thinning medication (e.g. Coumadin, Plavix, see list of "Blood Thinners"), or if you have an active infection going on, you should not have the procedure.  If you are taking any blood thinners, please inform your physician.  How should I prepare for this procedure?  Do not eat or drink anything at least six hours prior to the procedure.  Bring a driver with you .  It cannot be a taxi.  Come accompanied by an adult that can drive you back, and that is strong enough to help you if your legs get weak or numb from the local anesthetic.  Take all of your medicines the morning of the procedure with just enough water to swallow them.  If you have diabetes, make sure that you are scheduled to have your procedure done first thing in the morning, whenever possible.  If you have diabetes, take only half of your insulin dose and notify our nurse that you have done so as soon as you arrive at the clinic.  If you are diabetic, but only take blood sugar pills (oral hypoglycemic), then do not take them on the morning of your procedure.  You may take them after you have had the procedure.  Do not take aspirin or any  aspirin-containing medications, at least eleven (11) days prior to the procedure.  They may prolong bleeding.  Wear loose fitting clothing that may be easy to take off and that you would not mind if it got stained with Betadine or blood.  Do not wear any jewelry or perfume  Remove any nail coloring.  It will interfere with some of our monitoring equipment.  NOTE: Remember that this is not meant to be interpreted as a complete list of all possible complications.  Unforeseen problems may occur.  BLOOD THINNERS The following drugs contain aspirin or other products, which can cause increased bleeding during surgery and should not be taken for 2 weeks prior to and 1 week after surgery.  If you should need take something for relief of minor  pain, you may take acetaminophen which is found in Tylenol,m Datril, Anacin-3 and Panadol. It is not blood thinner. The products listed below are.  Do not take any of the products listed below in addition to any listed on your instruction sheet.  A.P.C or A.P.C with Codeine Codeine Phosphate Capsules #3 Ibuprofen Ridaura  ABC compound Congesprin Imuran rimadil  Advil Cope Indocin Robaxisal  Alka-Seltzer Effervescent Pain Reliever and Antacid Coricidin or Coricidin-D  Indomethacin Rufen  Alka-Seltzer plus Cold Medicine Cosprin Ketoprofen S-A-C Tablets  Anacin Analgesic Tablets or Capsules Coumadin Korlgesic Salflex  Anacin Extra Strength Analgesic tablets or capsules CP-2 Tablets Lanoril Salicylate  Anaprox Cuprimine Capsules Levenox Salocol  Anexsia-D Dalteparin Magan Salsalate  Anodynos Darvon compound Magnesium Salicylate Sine-off  Ansaid Dasin Capsules Magsal Sodium Salicylate  Anturane Depen Capsules Marnal Soma  APF Arthritis pain formula Dewitt's Pills Measurin Stanback  Argesic Dia-Gesic Meclofenamic Sulfinpyrazone  Arthritis Bayer Timed Release Aspirin Diclofenac Meclomen Sulindac  Arthritis pain formula Anacin Dicumarol Medipren Supac  Analgesic  (Safety coated) Arthralgen Diffunasal Mefanamic Suprofen  Arthritis Strength Bufferin Dihydrocodeine Mepro Compound Suprol  Arthropan liquid Dopirydamole Methcarbomol with Aspirin Synalgos  ASA tablets/Enseals Disalcid Micrainin Tagament  Ascriptin Doan's Midol Talwin  Ascriptin A/D Dolene Mobidin Tanderil  Ascriptin Extra Strength Dolobid Moblgesic Ticlid  Ascriptin with Codeine Doloprin or Doloprin with Codeine Momentum Tolectin  Asperbuf Duoprin Mono-gesic Trendar  Aspergum Duradyne Motrin or Motrin IB Triminicin  Aspirin plain, buffered or enteric coated Durasal Myochrisine Trigesic  Aspirin Suppositories Easprin Nalfon Trillsate  Aspirin with Codeine Ecotrin Regular or Extra Strength Naprosyn Uracel  Atromid-S Efficin Naproxen Ursinus  Auranofin Capsules Elmiron Neocylate Vanquish  Axotal Emagrin Norgesic Verin  Azathioprine Empirin or Empirin with Codeine Normiflo Vitamin E  Azolid Emprazil Nuprin Voltaren  Bayer Aspirin plain, buffered or children's or timed BC Tablets or powders Encaprin Orgaran Warfarin Sodium  Buff-a-Comp Enoxaparin Orudis Zorpin  Buff-a-Comp with Codeine Equegesic Os-Cal-Gesic   Buffaprin Excedrin plain, buffered or Extra Strength Oxalid   Bufferin Arthritis Strength Feldene Oxphenbutazone   Bufferin plain or Extra Strength Feldene Capsules Oxycodone with Aspirin   Bufferin with Codeine Fenoprofen Fenoprofen Pabalate or Pabalate-SF   Buffets II Flogesic Panagesic   Buffinol plain or Extra Strength Florinal or Florinal with Codeine Panwarfarin   Buf-Tabs Flurbiprofen Penicillamine   Butalbital Compound Four-way cold tablets Penicillin   Butazolidin Fragmin Pepto-Bismol   Carbenicillin Geminisyn Percodan   Carna Arthritis Reliever Geopen Persantine   Carprofen Gold's salt Persistin   Chloramphenicol Goody's Phenylbutazone   Chloromycetin Haltrain Piroxlcam   Clmetidine heparin Plaquenil   Cllnoril Hyco-pap Ponstel   Clofibrate Hydroxy chloroquine  Propoxyphen         Before stopping any of these medications, be sure to consult the physician who ordered them.  Some, such as Coumadin (Warfarin) are ordered to prevent or treat serious conditions such as "deep thrombosis", "pumonary embolisms", and other heart problems.  The amount of time that you may need off of the medication may also vary with the medication and the reason for which you were taking it.  If you are taking any of these medications, please make sure you notify your pain physician before you undergo any procedures.         Facet Joint Block The facet joints connect the bones of the spine (vertebrae). They make it possible for you to bend, twist, and make other movements with your spine. They also prevent you from overbending, overtwisting, and making other excessive movements.  A facet  joint block is a procedure where a numbing medicine (anesthetic) is injected into a facet joint. Often, a type of anti-inflammatory medicine called a steroid is also injected. A facet joint block may be done for two reasons:  2. Diagnosis. A facet joint block may be done as a test to see whether neck or back pain is caused by a worn-down or infected facet joint. If the pain gets better after a facet joint block, it means the pain is probably coming from the facet joint. If the pain does not get better, it means the pain is probably not coming from the facet joint.  3. Therapy. A facet joint block may be done to relieve neck or back pain caused by a facet joint. A facet joint block is only done as a therapy if the pain does not improve with medicine, exercise programs, physical therapy, and other forms of pain management. LET [facility] CARE PROVIDER KNOW ABOUT:   Any allergies you have.   All medicines you are taking, including vitamins, herbs, eyedrops, and over-the-counter medicines and creams.   Previous problems you or members of your family have had with the use of anesthetics.    Any blood disorders you have had.   Other health problems you have. RISKS AND COMPLICATIONS Generally, having a facet joint block is safe. However, as with any procedure, complications can occur. Possible complications associated with having a facet joint block include:   Bleeding.   Injury to a nerve near the injection site.   Pain at the injection site.   Weakness or numbness in areas controlled by nerves near the injection site.   Infection.   Temporary fluid retention.   Allergic reaction to anesthetics or medicines used during the procedure. BEFORE THE PROCEDURE   Follow your health care provider's instructions if you are taking dietary supplements or medicines. You may need to stop taking them or reduce your dosage.   Do not take any new dietary supplements or medicines without asking your health care provider first.   Follow your health care provider's instructions about eating and drinking before the procedure. You may need to stop eating and drinking several hours before the procedure.   Arrange to have an adult drive you home after the procedure. PROCEDURE 12. You may need to remove your clothing and dress in an open-back gown so that your health care provider can access your spine.  13. The procedure will be done while you are lying on an X-ray table. Most of the time you will be asked to lie on your stomach, but you may be asked to lie in a different position if an injection will be made in your neck.  14. Special machines will be used to monitor your oxygen levels, heart rate, and blood pressure.  15. If an injection will be made in your neck, an intravenous (IV) tube will be inserted into one of your veins. Fluids and medicine will flow directly into your body through the IV tube.  16. The area over the facet joint where the injection will be made will be cleaned with an antiseptic soap. The surrounding skin will be covered with sterile drapes.   17. An anesthetic will be applied to your skin to make the injection area numb. You may feel a temporary stinging or burning sensation.  18. A video X-ray machine will be used to locate the joint. A contrast dye may be injected into the facet joint area to help with locating  the joint.  19. When the joint is located, an anesthetic medicine will be injected into the joint through the needle.  42. Your health care provider will ask you whether you feel pain relief. If you do feel relief, a steroid may be injected to provide pain relief for a longer period of time. If you do not feel relief or feel only partial relief, additional injections of an anesthetic may be made in other facet joints.  21. The needle will be removed, the skin will be cleansed, and bandages will be applied.  AFTER THE PROCEDURE   You will be observed for 15-30 minutes before being allowed to go home. Do not drive. Have an adult drive you or take a taxi or public transportation instead.   If you feel pain relief, the pain will return in several hours or days when the anesthetic wears off.   You may feel pain relief 2-14 days after the procedure. The amount of time this relief lasts varies from person to person.   It is normal to feel some tenderness over the injected area(s) for 2 days following the procedure.   If you have diabetes, you may have a temporary increase in blood sugar. Document Released: 07/29/2006 Document Revised: 07/24/2013 Document Reviewed: 12/28/2011 [facility] Patient Information 2015 [place], [place]. This information is not intended to replace advice given to you by your health care provider. Make sure you discuss any questions you have with your health care provider.

## 2014-09-18 ENCOUNTER — Other Ambulatory Visit: Payer: Self-pay | Admitting: Pain Medicine

## 2014-09-19 ENCOUNTER — Encounter: Payer: Self-pay | Admitting: Pain Medicine

## 2014-09-19 ENCOUNTER — Ambulatory Visit: Payer: 59 | Attending: Pain Medicine | Admitting: Pain Medicine

## 2014-09-19 VITALS — BP 114/50 | HR 71 | Temp 98.5°F | Resp 16 | Ht 60.0 in | Wt 162.0 lb

## 2014-09-19 DIAGNOSIS — M79605 Pain in left leg: Secondary | ICD-10-CM | POA: Diagnosis present

## 2014-09-19 DIAGNOSIS — M47816 Spondylosis without myelopathy or radiculopathy, lumbar region: Secondary | ICD-10-CM

## 2014-09-19 DIAGNOSIS — M545 Low back pain: Secondary | ICD-10-CM | POA: Diagnosis present

## 2014-09-19 DIAGNOSIS — M5126 Other intervertebral disc displacement, lumbar region: Secondary | ICD-10-CM | POA: Insufficient documentation

## 2014-09-19 DIAGNOSIS — M79604 Pain in right leg: Secondary | ICD-10-CM | POA: Diagnosis present

## 2014-09-19 DIAGNOSIS — M5136 Other intervertebral disc degeneration, lumbar region: Secondary | ICD-10-CM

## 2014-09-19 MED ORDER — MIDAZOLAM HCL 5 MG/5ML IJ SOLN
INTRAMUSCULAR | Status: AC
  Administered 2014-09-19: 3 mg via INTRAVENOUS
  Filled 2014-09-19: qty 5

## 2014-09-19 MED ORDER — BUPIVACAINE HCL (PF) 0.25 % IJ SOLN
INTRAMUSCULAR | Status: AC
  Administered 2014-09-19: 30 mL
  Filled 2014-09-19: qty 30

## 2014-09-19 MED ORDER — ORPHENADRINE CITRATE 30 MG/ML IJ SOLN
INTRAMUSCULAR | Status: AC
  Administered 2014-09-19: 60 mg
  Filled 2014-09-19: qty 2

## 2014-09-19 MED ORDER — TRIAMCINOLONE ACETONIDE 40 MG/ML IJ SUSP
INTRAMUSCULAR | Status: AC
  Administered 2014-09-19: 40 mg
  Filled 2014-09-19: qty 1

## 2014-09-19 MED ORDER — FENTANYL CITRATE (PF) 100 MCG/2ML IJ SOLN
INTRAMUSCULAR | Status: AC
  Administered 2014-09-19: 100 ug via INTRAVENOUS
  Filled 2014-09-19: qty 2

## 2014-09-19 NOTE — Progress Notes (Signed)
Subjective:    Patient ID: Hannah [name], female    DOB: [birth year], 64 y.o.   MRN: [id number]  HPI  PROCEDURE PERFORMED: Lumbar facet (medial branch block)   NOTE: The patient is a 64 y.o. female who returns to [place] for further evaluation and treatment of pain involving the lumbar and lower extremity region. 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 The risks, benefits, and expectations of the procedure have been discussed and explained to the patient who was understanding and in agreement with suggested treatment plan. We will proceed with interventional treatment as discussed and as explained to the patient who was understanding and wished to proceed with procedure as planned.   DESCRIPTION OF PROCEDURE: Lumbar facet (medial branch block) with IV Versed, IV fentanyl conscious sedation, EKG, blood pressure, pulse, and pulse oximetry monitoring. The procedure was performed with the patient in the prone position. Betadine prep of proposed entry site performed.   NEEDLE PLACEMENT AT: Left L 3 lumbar facet (medial branch block). Under fluoroscopic guidance with oblique orientation of 15 degrees, a 22-gauge needle was inserted at the L 3 vertebral body level with needle placed at the targeted area of Burton's Eye or Eye of the Scotty Dog with documentation of needle placement in the superior and lateral border of targeted area of Burton's Eye or Eye of the Scotty Dog with oblique orientation of 15 degrees. Following documentation of needle placement at the L 3 vertebral body level, needle  placement was then accomplished at the L 4 vertebral body level.   NEEDLE PLACEMENT AT L4 and L5 VERTEBRAL BODY LEVELS ON THE LEFT SIDE The procedure was performed at the L4 and L5 vertebral body levels exactly as was performed at the L 3 vertebral body level utilizing the same technique and under fluoroscopic guidance.  NEEDLE PLACEMENT AT THE SACRAL ALA with AP view of the lumbosacral spine. With the patient in the prone position, Betadine prep of proposed entry site accomplished, a 22 gauge needle was inserted in the region of the sacral ala (groove formed by the superior articulating process of S1 and the sacral wing). Following documentation of needle placement at the sacral ala,  needle placement was then accomplished at the S1 foramen level.   NEEDLE PLACEMENT AT THE S1 FORAMEN LEVEL under fluoroscopic guidance with AP view of the lumbosacral spine and cephalad orientation of the fluoroscope, a 22-gauge needle was placed at the superior and lateral border of the S1 foramen under fluoroscopic guidance. Following documentation of needle placement at the S1 foramen.   Needle placement was then verified at all levels on lateral view. Following documentation of needle placement at all levels on lateral view and following negative aspiration for heme and CSF, each level was injected with 1 mL of 0.25% bupivacaine with Kenalog.     LUMBAR FACET, MEDIAL BRANCH NERVE, BLOCKS PERFORMED ON THE RIGHT SIDE   The procedure was performed on the right side exactly as was performed on the left side at the same levels and utilizing the same technique under fluoroscopic guidance.     The patient tolerated the procedure well. A  total of 40 mg of Kenalog was utilized for the procedure.   PLAN:  1. Medications: The patient will continue presently prescribed medications. 2. May consider modification of treatment regimen at time of return appointment pending response to treatment rendered on today's  visit. 3. The patient is to follow-up with primary care physician for further evaluation of blood pressure and general medical condition status post steroid injection performed on today's visit. 4. Surgical follow-up evaluation. 5. Neurological follow-up evaluation. 6. Psych evaluation as discussed for evaluation of elements of depression 7. The patient may be candidate for radiofrequency procedures, implantation type procedures, and other treatment pending response to treatment and follow-up evaluation. 8. The patient has been advised to call the [facility] prior to scheduled return appointment should there be significant change in condition or should patient have other concerns regarding condition prior to scheduled return appointment.  The patient is understanding and in agreement with suggested treatment plan.     Review of Systems     Objective:   Physical Exam        Assessment & Plan:

## 2014-09-19 NOTE — Patient Instructions (Addendum)
Continue present medications  F/U PCP for evaliation of  BP and general medical  condition.  F/U surgical evaluation  F/U neurological evaluation  May consider radiofrequency rhizolysis or intraspinal procedures pending response to present treatment and F/U evaluation.  Patient to call [facility] should patient have concerns prior to scheduled return appointment. GENERAL RISKS AND COMPLICATIONS  What are the risk, side effects and possible complications? Generally speaking, most procedures are safe.  However, with any procedure there are risks, side effects, and the possibility of complications.  The risks and complications are dependent upon the sites that are lesioned, or the type of nerve block to be performed.  The closer the procedure is to the spine, the more serious the risks are.  Great care is taken when placing the radio frequency needles, block needles or lesioning probes, but sometimes complications can occur. 1. Infection: Any time there is an injection through the skin, there is a risk of infection.  This is why sterile conditions are used for these blocks.  There are four possible types of infection. 1. Localized skin infection. 2. Central Nervous System Infection-This can be in the form of Meningitis, which can be deadly. 3. Epidural Infections-This can be in the form of an epidural abscess, which can cause pressure inside of the spine, causing compression of the spinal cord with subsequent paralysis. This would require an emergency surgery to decompress, and there are no guarantees that the patient would recover from the paralysis. 4. Discitis-This is an infection of the intervertebral discs.  It occurs in about 1% of discography procedures.  It is difficult to treat and it may lead to surgery.        2. Pain: the needles have to go through skin and soft tissues, will cause soreness.       3. Damage to internal structures:  The nerves to be lesioned may be near blood  vessels or    other nerves which can be potentially damaged.       4. Bleeding: Bleeding is more common if the patient is taking blood thinners such as  aspirin, Coumadin, Ticiid, Plavix, etc., or if he/she have some genetic predisposition  such as hemophilia. Bleeding into the spinal canal can cause compression of the spinal  cord with subsequent paralysis.  This would require an emergency surgery to  decompress and there are no guarantees that the patient would recover from the  paralysis.       5. Pneumothorax:  Puncturing of a lung is a possibility, every time a needle is introduced in  the area of the chest or upper back.  Pneumothorax refers to free air around the  collapsed lung(s), inside of the thoracic cavity (chest cavity).  Another two possible  complications related to a similar event would include: Hemothorax and Chylothorax.   These are variations of the Pneumothorax, where instead of air around the collapsed  lung(s), you may have blood or chyle, respectively.       6. Spinal headaches: They may occur with any procedures in the area of the spine.       7. Persistent CSF (Cerebro-Spinal Fluid) leakage: This is a rare problem, but may occur  with prolonged intrathecal or epidural catheters either due to the formation of a fistulous  track or a dural tear.       8. Nerve damage: By working so close to the spinal cord, there is always a possibility of  nerve damage, which could be  as serious as a permanent spinal cord injury with  paralysis.       9. Death:  Although rare, severe deadly allergic reactions known as "Anaphylactic  reaction" can occur to any of the medications used.      10. Worsening of the symptoms:  We can always make thing worse.  What are the chances of something like this happening? Chances of any of this occuring are extremely low.  By statistics, you have more of a chance of getting killed in a motor vehicle accident: while driving to the hospital than any of the above  occurring .  Nevertheless, you should be aware that they are possibilities.  In general, it is similar to taking a shower.  Everybody knows that you can slip, hit your head and get killed.  Does that mean that you should not shower again?  Nevertheless always keep in mind that statistics do not mean anything if you happen to be on the wrong side of them.  Even if a procedure has a 1 (one) in a 1,000,000 (million) chance of going wrong, it you happen to be that one..Also, keep in mind that by statistics, you have more of a chance of having something go wrong when taking medications.  Who should not have this procedure? If you are on a blood thinning medication (e.g. Coumadin, Plavix, see list of "Blood Thinners"), or if you have an active infection going on, you should not have the procedure.  If you are taking any blood thinners, please inform your physician.  How should I prepare for this procedure?  Do not eat or drink anything at least six hours prior to the procedure.  Bring a driver with you .  It cannot be a taxi.  Come accompanied by an adult that can drive you back, and that is strong enough to help you if your legs get weak or numb from the local anesthetic.  Take all of your medicines the morning of the procedure with just enough water to swallow them.  If you have diabetes, make sure that you are scheduled to have your procedure done first thing in the morning, whenever possible.  If you have diabetes, take only half of your insulin dose and notify our nurse that you have done so as soon as you arrive at the clinic.  If you are diabetic, but only take blood sugar pills (oral hypoglycemic), then do not take them on the morning of your procedure.  You may take them after you have had the procedure.  Do not take aspirin or any aspirin-containing medications, at least eleven (11) days prior to the procedure.  They may prolong bleeding.  Wear loose fitting clothing that may be easy to  take off and that you would not mind if it got stained with Betadine or blood.  Do not wear any jewelry or perfume  Remove any nail coloring.  It will interfere with some of our monitoring equipment.  NOTE: Remember that this is not meant to be interpreted as a complete list of all possible complications.  Unforeseen problems may occur.  BLOOD THINNERS The following drugs contain aspirin or other products, which can cause increased bleeding during surgery and should not be taken for 2 weeks prior to and 1 week after surgery.  If you should need take something for relief of minor pain, you may take acetaminophen which is found in Tylenol,m Datril, Anacin-3 and Panadol. It is not blood thinner. The products listed below  are.  Do not take any of the products listed below in addition to any listed on your instruction sheet.  A.P.C or A.P.C with Codeine Codeine Phosphate Capsules #3 Ibuprofen Ridaura  ABC compound Congesprin Imuran rimadil  Advil Cope Indocin Robaxisal  Alka-Seltzer Effervescent Pain Reliever and Antacid Coricidin or Coricidin-D  Indomethacin Rufen  Alka-Seltzer plus Cold Medicine Cosprin Ketoprofen S-A-C Tablets  Anacin Analgesic Tablets or Capsules Coumadin Korlgesic Salflex  Anacin Extra Strength Analgesic tablets or capsules CP-2 Tablets Lanoril Salicylate  Anaprox Cuprimine Capsules Levenox Salocol  Anexsia-D Dalteparin Magan Salsalate  Anodynos Darvon compound Magnesium Salicylate Sine-off  Ansaid Dasin Capsules Magsal Sodium Salicylate  Anturane Depen Capsules Marnal Soma  APF Arthritis pain formula Dewitt's Pills Measurin Stanback  Argesic Dia-Gesic Meclofenamic Sulfinpyrazone  Arthritis Bayer Timed Release Aspirin Diclofenac Meclomen Sulindac  Arthritis pain formula Anacin Dicumarol Medipren Supac  Analgesic (Safety coated) Arthralgen Diffunasal Mefanamic Suprofen  Arthritis Strength Bufferin Dihydrocodeine Mepro Compound Suprol  Arthropan liquid Dopirydamole  Methcarbomol with Aspirin Synalgos  ASA tablets/Enseals Disalcid Micrainin Tagament  Ascriptin Doan's Midol Talwin  Ascriptin A/D Dolene Mobidin Tanderil  Ascriptin Extra Strength Dolobid Moblgesic Ticlid  Ascriptin with Codeine Doloprin or Doloprin with Codeine Momentum Tolectin  Asperbuf Duoprin Mono-gesic Trendar  Aspergum Duradyne Motrin or Motrin IB Triminicin  Aspirin plain, buffered or enteric coated Durasal Myochrisine Trigesic  Aspirin Suppositories Easprin Nalfon Trillsate  Aspirin with Codeine Ecotrin Regular or Extra Strength Naprosyn Uracel  Atromid-S Efficin Naproxen Ursinus  Auranofin Capsules Elmiron Neocylate Vanquish  Axotal Emagrin Norgesic Verin  Azathioprine Empirin or Empirin with Codeine Normiflo Vitamin E  Azolid Emprazil Nuprin Voltaren  Bayer Aspirin plain, buffered or children's or timed BC Tablets or powders Encaprin Orgaran Warfarin Sodium  Buff-a-Comp Enoxaparin Orudis Zorpin  Buff-a-Comp with Codeine Equegesic Os-Cal-Gesic   Buffaprin Excedrin plain, buffered or Extra Strength Oxalid   Bufferin Arthritis Strength Feldene Oxphenbutazone   Bufferin plain or Extra Strength Feldene Capsules Oxycodone with Aspirin   Bufferin with Codeine Fenoprofen Fenoprofen Pabalate or Pabalate-SF   Buffets II Flogesic Panagesic   Buffinol plain or Extra Strength Florinal or Florinal with Codeine Panwarfarin   Buf-Tabs Flurbiprofen Penicillamine   Butalbital Compound Four-way cold tablets Penicillin   Butazolidin Fragmin Pepto-Bismol   Carbenicillin Geminisyn Percodan   Carna Arthritis Reliever Geopen Persantine   Carprofen Gold's salt Persistin   Chloramphenicol Goody's Phenylbutazone   Chloromycetin Haltrain Piroxlcam   Clmetidine heparin Plaquenil   Cllnoril Hyco-pap Ponstel   Clofibrate Hydroxy chloroquine Propoxyphen         Before stopping any of these medications, be sure to consult the physician who ordered them.  Some, such as Coumadin (Warfarin) are ordered  to prevent or treat serious conditions such as "deep thrombosis", "pumonary embolisms", and other heart problems.  The amount of time that you may need off of the medication may also vary with the medication and the reason for which you were taking it.  If you are taking any of these medications, please make sure you notify your pain physician before you undergo any procedures.         Pain Management Discharge Instructions  General Discharge Instructions :  If you need to reach your doctor call: Monday-Friday 8:00 am - 4:00 pm at [phone] or toll free [phone].  After clinic hours [phone] to have operator reach doctor.  Bring all of your medication bottles to all your appointments in the pain clinic.  To cancel or reschedule your appointment with Pain Management please remember  to call 24 hours in advance to avoid a fee.  Refer to the educational materials which you have been given on: General Risks, I had my Procedure. Discharge Instructions, Post Sedation.  Post Procedure Instructions:  The drugs you were given will stay in your system until tomorrow, so for the next 24 hours you should not drive, make any legal decisions or drink any alcoholic beverages.  You may eat anything you prefer, but it is better to start with liquids then soups and crackers, and gradually work up to solid foods.  Please notify your doctor immediately if you have any unusual bleeding, trouble breathing or pain that is not related to your normal pain.  Depending on the type of procedure that was done, some parts of your body may feel week and/or numb.  This usually clears up by tonight or the next day.  Walk with the use of an assistive device or accompanied by an adult for the 24 hours.  You may use ice on the affected area for the first 24 hours.  Put ice in a Ziploc bag and cover with a towel and place against area 15 minutes on 15 minutes off.  You may switch to heat after 24 hours. 

## 2014-09-19 NOTE — Progress Notes (Signed)
Safety precautions to be maintained throughout the outpatient stay will include: orient to surroundings, keep bed in low position, maintain call bell within reach at all times, provide assistance with transfer out of bed and ambulation.  

## 2014-09-20 ENCOUNTER — Telehealth: Payer: Self-pay | Admitting: *Deleted

## 2014-09-20 NOTE — Telephone Encounter (Signed)
Patient denies complications post procedure. 

## 2014-10-16 ENCOUNTER — Encounter: Payer: Self-pay | Admitting: Pain Medicine

## 2014-10-16 ENCOUNTER — Ambulatory Visit: Payer: 59 | Attending: Pain Medicine | Admitting: Pain Medicine

## 2014-10-16 VITALS — BP 161/64 | HR 95 | Temp 98.6°F | Resp 16 | Ht 60.0 in | Wt 162.0 lb

## 2014-10-16 DIAGNOSIS — M48062 Spinal stenosis, lumbar region with neurogenic claudication: Secondary | ICD-10-CM

## 2014-10-16 DIAGNOSIS — M47816 Spondylosis without myelopathy or radiculopathy, lumbar region: Secondary | ICD-10-CM | POA: Diagnosis not present

## 2014-10-16 DIAGNOSIS — I10 Essential (primary) hypertension: Secondary | ICD-10-CM

## 2014-10-16 DIAGNOSIS — M79604 Pain in right leg: Secondary | ICD-10-CM

## 2014-10-16 DIAGNOSIS — M545 Low back pain: Secondary | ICD-10-CM | POA: Diagnosis present

## 2014-10-16 DIAGNOSIS — M5136 Other intervertebral disc degeneration, lumbar region: Secondary | ICD-10-CM

## 2014-10-16 DIAGNOSIS — M533 Sacrococcygeal disorders, not elsewhere classified: Secondary | ICD-10-CM | POA: Diagnosis not present

## 2014-10-16 DIAGNOSIS — R41840 Attention and concentration deficit: Secondary | ICD-10-CM

## 2014-10-16 DIAGNOSIS — M79659 Pain in unspecified thigh: Secondary | ICD-10-CM

## 2014-10-16 DIAGNOSIS — M51369 Other intervertebral disc degeneration, lumbar region without mention of lumbar back pain or lower extremity pain: Secondary | ICD-10-CM

## 2014-10-16 DIAGNOSIS — M4806 Spinal stenosis, lumbar region: Secondary | ICD-10-CM | POA: Diagnosis not present

## 2014-10-16 NOTE — Progress Notes (Signed)
Safety precautions to be maintained throughout the outpatient stay will include: orient to surroundings, keep bed in low position, maintain call bell within reach at all times, provide assistance with transfer out of bed and ambulation.  

## 2014-10-16 NOTE — Progress Notes (Signed)
Subjective:    Patient ID: Hannah [name], female    DOB: 12-07-1950, 64 y.o.   MRN: [id number]  HPI  Patient is 64 year old female returns to [place] for further evaluation and treatment of pain involving the lower back lower extremity region. Patient admits to pain associated with some weakness of the lower extremities with prolonged standing and walking. Twisting turning maneuvers is also aggravated patient's condition to significant degree. Patient denies any trauma change in events of daily living the call significant change in symptomatology. We will proceed with lumbar epidural steroid injection at time return appointment and will also proceed with scheduling patient for neurosurgical evaluation of lower back lower extremity pain and weakness. The patient was understanding and in agreement status treatment plan.    Review of Systems     Objective:   Physical Exam  There was mild tenderness of the spinous And occipitalis musculature regions. Mild tenderness of thoracic facet thoracic paraspinal musculature region as well as the cervical facet cervical paraspinal musculature region. There appeared to be unremarkable Spurling's maneuver. Patient was with bilaterally equal grip strength. Tinel and Phalen's maneuver were without increase of pain significant degree. Palpation over the lower thoracic paraspinal muscles region thoracic facet region was with evidence of muscle spasms of moderate degree with extension and palpation of the lumbar facets reproducing moderate moderately severe discomfort. There was tenderness over the PSIS and PII S region of mild degree to moderately mild degree. Mild tenderness of the greater trochanteric region iliotibial band region was noted. Straight leg raising was tolerates approximately 20 without a definite increased pain with dorsiflexion noted. There was negative clonus negative Homans. Patient had mild difficulty attempted to stand on  tiptoes and heels. There was negative clonus negative Homans abdomen was nontender and there was no costovertebral angle tenderness noted.      Assessment & Plan:   Progress Notes   RAYLENE CARMICKLE (MR# [id number])      Progress Notes Info    Author Note Status Last Update User Last Update Date/Time   Mohammed Kindle, MD Signed Mohammed Kindle, MD 09/04/2014 3:36 PM    Progress Notes    Expand All Collapse All     Subjective:    Patient ID: Hannah [name], female DOB: 05-12-1950, 64 y.o. MRN: [id number]  HPI  Patient is 64 year old female returns to [place] for further evaluation and treatment of pain involving the region of the mid and lower back and lower extremity regions. Patientreturn of her pain at this time which patient states is aggravated by standing walking twisting turning maneuvers. Patient denies trauma change in events of daily living the call significant change in symptomatology. Patient states the pain is aggravated by prolonged standing walking and the pain interferes with ability to obtain restful sleep as well. We discussed patient's condition and will consider patient for lumbar facet, medial branch nerve, blocks at time return appointment. Patient is understanding and agreed suggested treatment plan.     Review of Systems     Objective:   Physical Exam There was tenderness to palpation of the splenius capitis and occipitalis musculature region of mild degree and there was mild tenderness of the cervical facet cervical paraspinal musculature region. Palpation over the acromioclavicular glenohumeral joint region was with mild tends palpation and patient appeared to be with bilaterally equal grip strength with Tinel and Phalen's maneuver without increase of pain of significant degree. Palpation over the thoracic facet thoracic paraspinal musculature region  was a tends to palpation of moderate degree in the lower thoracic region with no  crepitus of the thoracic region haven't been noted. Palpation over the lumbar paraspinal musculature region lumbar facet region associated tends to palpation moderate moderately severe degree. Lateral bending and rotation and extension and palpation of the lumbar facets reproduce moderate to moderately severe discomfort. Straight leg raising was tolerates approximately 30 without increased pain with dorsiflexion noted. There was moderate tends of the PSIS and PII S region as well as the gluteal and piriformis muscles regions. There was tenderness along the greater trochanteric region iliotibial band region of mild degree. No sensory deficit of dermatomal distribution detected. Abdomen nontender and no costovertebral angle tenderness was noted.      Assessment & Plan:  Degenerative disc disease lumbar spine Kyphotic curvature at the 1112 disc space, spondylosis and shallow disc bulge at this level resulting in mild canal and moderate bilateral foraminal stenosis and mild anterior and superior T12 upper endplate depression, lack of marrow edema indicative of chronic change with mild osseous and disc changes at the remaining levels with adrenal nodule. Disc desiccation central right with the central disc bulge resulting and right foraminal narrowing L3-4 level with moderate left and right facet arthrosis. L4-L5 disc desiccation. Shallow broad-based disc bulging and narrowing of the central canal and left foramen at L4-L5 level  Lumbar facet syndrome  Lumbar stenosis with neurogenic claudication   Sacroiliac joint dysfunction     Plan  Continue present medication Neurontin  Lumbar epidural steroid injection to be performed at time return appointment  F/U PCP Dr. Lisette Grinder III for evaliation of BP and general medical Condition.  Psych evaluation scheduled for evaluation of elements of depression  F/U surgical evaluation. Neurosurgical reevaluation as scheduled to evaluate lower back  lower extremity pain and weakness  Psych evaluation as discussed  F/U neurological evaluation.  May consider radiofrequency rhizolysis or intraspinal procedures pending response to present treatment and F/U evaluation.  Patient to call Pain Management Center should patient have concerns prior to scheduled return appointment.

## 2014-10-16 NOTE — Patient Instructions (Addendum)
Continue present medication Neurontin  Lumbar epidural steroid injection to be performed Wednesday, 10/24/2014  F/U PCP Dr. Lisette Grinder III for evaliation of  BP and general medical  condition.  F/U surgical evaluation. We scheduled neurosurgical evaluation of your lower back and lower extremity pain and weakness. Ask Caryl Pina date of your appointment please  F/U neurological evaluation.  May consider radiofrequency rhizolysis or intraspinal procedures pending response to present treatment and F/U evaluation.  Patient to call [facility] should patient have concerns prior to scheduled return appointment. GENERAL RISKS AND COMPLICATIONS  What are the risk, side effects and possible complications? Generally speaking, most procedures are safe.  However, with any procedure there are risks, side effects, and the possibility of complications.  The risks and complications are dependent upon the sites that are lesioned, or the type of nerve block to be performed.  The closer the procedure is to the spine, the more serious the risks are.  Great care is taken when placing the radio frequency needles, block needles or lesioning probes, but sometimes complications can occur. 1. Infection: Any time there is an injection through the skin, there is a risk of infection.  This is why sterile conditions are used for these blocks.  There are four possible types of infection. 1. Localized skin infection. 2. Central Nervous System Infection-This can be in the form of Meningitis, which can be deadly. 3. Epidural Infections-This can be in the form of an epidural abscess, which can cause pressure inside of the spine, causing compression of the spinal cord with subsequent paralysis. This would require an emergency surgery to decompress, and there are no guarantees that the patient would recover from the paralysis. 4. Discitis-This is an infection of the intervertebral discs.  It occurs in about 1% of discography  procedures.  It is difficult to treat and it may lead to surgery.        2. Pain: the needles have to go through skin and soft tissues, will cause soreness.       3. Damage to internal structures:  The nerves to be lesioned may be near blood vessels or    other nerves which can be potentially damaged.       4. Bleeding: Bleeding is more common if the patient is taking blood thinners such as  aspirin, Coumadin, Ticiid, Plavix, etc., or if he/she have some genetic predisposition  such as hemophilia. Bleeding into the spinal canal can cause compression of the spinal  cord with subsequent paralysis.  This would require an emergency surgery to  decompress and there are no guarantees that the patient would recover from the  paralysis.       5. Pneumothorax:  Puncturing of a lung is a possibility, every time a needle is introduced in  the area of the chest or upper back.  Pneumothorax refers to free air around the  collapsed lung(s), inside of the thoracic cavity (chest cavity).  Another two possible  complications related to a similar event would include: Hemothorax and Chylothorax.   These are variations of the Pneumothorax, where instead of air around the collapsed  lung(s), you may have blood or chyle, respectively.       6. Spinal headaches: They may occur with any procedures in the area of the spine.       7. Persistent CSF (Cerebro-Spinal Fluid) leakage: This is a rare problem, but may occur  with prolonged intrathecal or epidural catheters either due to the formation of a  fistulous  track or a dural tear.       8. Nerve damage: By working so close to the spinal cord, there is always a possibility of  nerve damage, which could be as serious as a permanent spinal cord injury with  paralysis.       9. Death:  Although rare, severe deadly allergic reactions known as "Anaphylactic  reaction" can occur to any of the medications used.      10. Worsening of the symptoms:  We can always make thing worse.  What  are the chances of something like this happening? Chances of any of this occuring are extremely low.  By statistics, you have more of a chance of getting killed in a motor vehicle accident: while driving to the hospital than any of the above occurring .  Nevertheless, you should be aware that they are possibilities.  In general, it is similar to taking a shower.  Everybody knows that you can slip, hit your head and get killed.  Does that mean that you should not shower again?  Nevertheless always keep in mind that statistics do not mean anything if you happen to be on the wrong side of them.  Even if a procedure has a 1 (one) in a 1,000,000 (million) chance of going wrong, it you happen to be that one..Also, keep in mind that by statistics, you have more of a chance of having something go wrong when taking medications.  Who should not have this procedure? If you are on a blood thinning medication (e.g. Coumadin, Plavix, see list of "Blood Thinners"), or if you have an active infection going on, you should not have the procedure.  If you are taking any blood thinners, please inform your physician.  How should I prepare for this procedure?  Do not eat or drink anything at least six hours prior to the procedure.  Bring a driver with you .  It cannot be a taxi.  Come accompanied by an adult that can drive you back, and that is strong enough to help you if your legs get weak or numb from the local anesthetic.  Take all of your medicines the morning of the procedure with just enough water to swallow them.  If you have diabetes, make sure that you are scheduled to have your procedure done first thing in the morning, whenever possible.  If you have diabetes, take only half of your insulin dose and notify our nurse that you have done so as soon as you arrive at the clinic.  If you are diabetic, but only take blood sugar pills (oral hypoglycemic), then do not take them on the morning of your procedure.   You may take them after you have had the procedure.  Do not take aspirin or any aspirin-containing medications, at least eleven (11) days prior to the procedure.  They may prolong bleeding.  Wear loose fitting clothing that may be easy to take off and that you would not mind if it got stained with Betadine or blood.  Do not wear any jewelry or perfume  Remove any nail coloring.  It will interfere with some of our monitoring equipment.  NOTE: Remember that this is not meant to be interpreted as a complete list of all possible complications.  Unforeseen problems may occur.  BLOOD THINNERS The following drugs contain aspirin or other products, which can cause increased bleeding during surgery and should not be taken for 2 weeks prior to and 1 week  after surgery.  If you should need take something for relief of minor pain, you may take acetaminophen which is found in Tylenol,m Datril, Anacin-3 and Panadol. It is not blood thinner. The products listed below are.  Do not take any of the products listed below in addition to any listed on your instruction sheet.  A.P.C or A.P.C with Codeine Codeine Phosphate Capsules #3 Ibuprofen Ridaura  ABC compound Congesprin Imuran rimadil  Advil Cope Indocin Robaxisal  Alka-Seltzer Effervescent Pain Reliever and Antacid Coricidin or Coricidin-D  Indomethacin Rufen  Alka-Seltzer plus Cold Medicine Cosprin Ketoprofen S-A-C Tablets  Anacin Analgesic Tablets or Capsules Coumadin Korlgesic Salflex  Anacin Extra Strength Analgesic tablets or capsules CP-2 Tablets Lanoril Salicylate  Anaprox Cuprimine Capsules Levenox Salocol  Anexsia-D Dalteparin Magan Salsalate  Anodynos Darvon compound Magnesium Salicylate Sine-off  Ansaid Dasin Capsules Magsal Sodium Salicylate  Anturane Depen Capsules Marnal Soma  APF Arthritis pain formula Dewitt's Pills Measurin Stanback  Argesic Dia-Gesic Meclofenamic Sulfinpyrazone  Arthritis Bayer Timed Release Aspirin Diclofenac  Meclomen Sulindac  Arthritis pain formula Anacin Dicumarol Medipren Supac  Analgesic (Safety coated) Arthralgen Diffunasal Mefanamic Suprofen  Arthritis Strength Bufferin Dihydrocodeine Mepro Compound Suprol  Arthropan liquid Dopirydamole Methcarbomol with Aspirin Synalgos  ASA tablets/Enseals Disalcid Micrainin Tagament  Ascriptin Doan's Midol Talwin  Ascriptin A/D Dolene Mobidin Tanderil  Ascriptin Extra Strength Dolobid Moblgesic Ticlid  Ascriptin with Codeine Doloprin or Doloprin with Codeine Momentum Tolectin  Asperbuf Duoprin Mono-gesic Trendar  Aspergum Duradyne Motrin or Motrin IB Triminicin  Aspirin plain, buffered or enteric coated Durasal Myochrisine Trigesic  Aspirin Suppositories Easprin Nalfon Trillsate  Aspirin with Codeine Ecotrin Regular or Extra Strength Naprosyn Uracel  Atromid-S Efficin Naproxen Ursinus  Auranofin Capsules Elmiron Neocylate Vanquish  Axotal Emagrin Norgesic Verin  Azathioprine Empirin or Empirin with Codeine Normiflo Vitamin E  Azolid Emprazil Nuprin Voltaren  Bayer Aspirin plain, buffered or children's or timed BC Tablets or powders Encaprin Orgaran Warfarin Sodium  Buff-a-Comp Enoxaparin Orudis Zorpin  Buff-a-Comp with Codeine Equegesic Os-Cal-Gesic   Buffaprin Excedrin plain, buffered or Extra Strength Oxalid   Bufferin Arthritis Strength Feldene Oxphenbutazone   Bufferin plain or Extra Strength Feldene Capsules Oxycodone with Aspirin   Bufferin with Codeine Fenoprofen Fenoprofen Pabalate or Pabalate-SF   Buffets II Flogesic Panagesic   Buffinol plain or Extra Strength Florinal or Florinal with Codeine Panwarfarin   Buf-Tabs Flurbiprofen Penicillamine   Butalbital Compound Four-way cold tablets Penicillin   Butazolidin Fragmin Pepto-Bismol   Carbenicillin Geminisyn Percodan   Carna Arthritis Reliever Geopen Persantine   Carprofen Gold's salt Persistin   Chloramphenicol Goody's Phenylbutazone   Chloromycetin Haltrain Piroxlcam   Clmetidine  heparin Plaquenil   Cllnoril Hyco-pap Ponstel   Clofibrate Hydroxy chloroquine Propoxyphen         Before stopping any of these medications, be sure to consult the physician who ordered them.  Some, such as Coumadin (Warfarin) are ordered to prevent or treat serious conditions such as "deep thrombosis", "pumonary embolisms", and other heart problems.  The amount of time that you may need off of the medication may also vary with the medication and the reason for which you were taking it.  If you are taking any of these medications, please make sure you notify your pain physician before you undergo any procedures.         Epidural Steroid Injection Patient Information  Description: The epidural space surrounds the nerves as they exit the spinal cord.  In some patients, the nerves can be compressed and inflamed by  a bulging disc or a tight spinal canal (spinal stenosis).  By injecting steroids into the epidural space, we can bring irritated nerves into direct contact with a potentially helpful medication.  These steroids act directly on the irritated nerves and can reduce swelling and inflammation which often leads to decreased pain.  Epidural steroids may be injected anywhere along the spine and from the neck to the low back depending upon the location of your pain.   After numbing the skin with local anesthetic (like Novocaine), a small needle is passed into the epidural space slowly.  You may experience a sensation of pressure while this is being done.  The entire block usually last less than 10 minutes.  Conditions which may be treated by epidural steroids:   Low back and leg pain  Neck and arm pain  Spinal stenosis  Post-laminectomy syndrome  Herpes zoster (shingles) pain  Pain from compression fractures  Preparation for the injection:  1. Do not eat any solid food or dairy products within 6 hours of your appointment.  2. You may drink clear liquids up to 2 hours before  appointment.  Clear liquids include water, black coffee, juice or soda.  No milk or cream please. 3. You may take your regular medication, including pain medications, with a sip of water before your appointment  Diabetics should hold regular insulin (if taken separately) and take 1/2 normal NPH dos the morning of the procedure.  Carry some sugar containing items with you to your appointment. 4. A driver must accompany you and be prepared to drive you home after your procedure.  5. Bring all your current medications with your. 6. An IV may be inserted and sedation may be given at the discretion of the physician.   7. A blood pressure cuff, EKG and other monitors will often be applied during the procedure.  Some patients may need to have extra oxygen administered for a short period. 8. You will be asked to provide medical information, including your allergies, prior to the procedure.  We must know immediately if you are taking blood thinners (like Coumadin/Warfarin)  Or if you are allergic to IV iodine contrast (dye). We must know if you could possible be pregnant.  Possible side-effects:  Bleeding from needle site  Infection (rare, may require surgery)  Nerve injury (rare)  Numbness & tingling (temporary)  Difficulty urinating (rare, temporary)  Spinal headache ( a headache worse with upright posture)  Light -headedness (temporary)  Pain at injection site (several days)  Decreased blood pressure (temporary)  Weakness in arm/leg (temporary)  Pressure sensation in back/neck (temporary)  Call if you experience:  Fever/chills associated with headache or increased back/neck pain.  Headache worsened by an upright position.  New onset weakness or numbness of an extremity below the injection site  Hives or difficulty breathing (go to the emergency room)  Inflammation or drainage at the infection site  Severe back/neck pain  Any new symptoms which are concerning to you  Please  note:  Although the local anesthetic injected can often make your back or neck feel good for several hours after the injection, the pain will likely return.  It takes 3-7 days for steroids to work in the epidural space.  You may not notice any pain relief for at least that one week.  If effective, we will often do a series of three injections spaced 3-6 weeks apart to maximally decrease your pain.  After the initial series, we generally will wait several  months before considering a repeat injection of the same type.  If you have any questions, please call [phone] [facility]

## 2014-10-22 ENCOUNTER — Ambulatory Visit: Payer: 59 | Attending: Pain Medicine | Admitting: Pain Medicine

## 2014-10-22 ENCOUNTER — Encounter: Payer: Self-pay | Admitting: Pain Medicine

## 2014-10-22 VITALS — BP 131/69 | HR 73 | Temp 98.7°F | Resp 14 | Ht 60.0 in | Wt 162.0 lb

## 2014-10-22 DIAGNOSIS — M5136 Other intervertebral disc degeneration, lumbar region: Secondary | ICD-10-CM

## 2014-10-22 DIAGNOSIS — M48062 Spinal stenosis, lumbar region with neurogenic claudication: Secondary | ICD-10-CM

## 2014-10-22 DIAGNOSIS — M47816 Spondylosis without myelopathy or radiculopathy, lumbar region: Secondary | ICD-10-CM

## 2014-10-22 DIAGNOSIS — M5126 Other intervertebral disc displacement, lumbar region: Secondary | ICD-10-CM | POA: Insufficient documentation

## 2014-10-22 DIAGNOSIS — M4806 Spinal stenosis, lumbar region: Secondary | ICD-10-CM | POA: Insufficient documentation

## 2014-10-22 DIAGNOSIS — M545 Low back pain: Secondary | ICD-10-CM | POA: Diagnosis present

## 2014-10-22 MED ORDER — MIDAZOLAM HCL 5 MG/5ML IJ SOLN
INTRAMUSCULAR | Status: AC
  Administered 2014-10-22: 1 mg via INTRAVENOUS
  Filled 2014-10-22: qty 5

## 2014-10-22 MED ORDER — ORPHENADRINE CITRATE 30 MG/ML IJ SOLN
INTRAMUSCULAR | Status: AC
  Filled 2014-10-22: qty 2

## 2014-10-22 MED ORDER — SODIUM CHLORIDE 0.9 % IJ SOLN
INTRAMUSCULAR | Status: AC
  Administered 2014-10-22: 20 mL
  Filled 2014-10-22: qty 20

## 2014-10-22 MED ORDER — CEFAZOLIN SODIUM 1 G IJ SOLR
INTRAMUSCULAR | Status: AC
  Administered 2014-10-22: 1 g via INTRAVENOUS
  Filled 2014-10-22: qty 10

## 2014-10-22 MED ORDER — CEFUROXIME AXETIL 250 MG PO TABS: 250.0000 mg | ORAL_TABLET | Freq: Two times a day (BID) | ORAL | Status: DC

## 2014-10-22 MED ORDER — TRIAMCINOLONE ACETONIDE 40 MG/ML IJ SUSP
INTRAMUSCULAR | Status: AC
  Administered 2014-10-22: 40 mg
  Filled 2014-10-22: qty 1

## 2014-10-22 MED ORDER — BUPIVACAINE HCL (PF) 0.25 % IJ SOLN
INTRAMUSCULAR | Status: AC
  Filled 2014-10-22: qty 30

## 2014-10-22 MED ORDER — FENTANYL CITRATE (PF) 100 MCG/2ML IJ SOLN
INTRAMUSCULAR | Status: AC
  Administered 2014-10-22: 50 ug via INTRAVENOUS
  Filled 2014-10-22: qty 2

## 2014-10-22 MED ORDER — LIDOCAINE HCL (PF) 1 % IJ SOLN
INTRAMUSCULAR | Status: AC
  Administered 2014-10-22: 3 mL
  Filled 2014-10-22: qty 5

## 2014-10-22 NOTE — Progress Notes (Signed)
.  oms

## 2014-10-22 NOTE — Progress Notes (Signed)
   Subjective:    Patient ID: Hannah [name], female    DOB: 1950-05-30, 63 y.o.   MRN: [id number]  HPI  PROCEDURE PERFORMED: Lumbar epidural steroid injection   NOTE: The patient is a 64 y.o. female who returns to [facility] for further evaluation and treatment of pain involving the lumbar and lower extremity region. 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 Disc desiccation central right with central disc bulge resulting and right foraminal narrowing L3-4 level with moderate left and right facet arthrosis. L4 L4-5 disc desiccation. Shallow broad-based disc bulging and narrowing of the central canal and left foramen at L4-L5 level. The risks, benefits, and expectations of the procedure have been discussed and explained to the patient who was understanding and in agreement with suggested treatment plan. We will proceed with lumbar epidural steroid injection as discussed and as explained to the patient who is willing to proceed with procedure as planned.   DESCRIPTION OF PROCEDURE: Lumbar epidural steroid injection with IV Versed, IV fentanyl conscious sedation, EKG, blood pressure, pulse, and pulse oximetry monitoring. The procedure was performed with the patient in the prone position under fluoroscopic guidance. A local anesthetic skin wheal of 1.5% plain lidocaine was accomplished at proposed entry site. An 18-gauge Tuohy epidural needle was inserted at the L 4 vertebral body level right of the midline via loss-of-resistance technique with negative heme and negative CSF return. A total of 4 mL of Preservative-Free normal saline with  40 mg of Kenalog injected incrementally via epidurally placed needle. Needle was removed.    A total of 40 mg of Kenalog was utilized for the procedure.   The patient tolerated the injection well.    PLAN:   1. Medications: We will continue presently prescribed medications for now. 2. Will consider modification of treatment regimen pending response to treatment rendered on today's visit and follow-up evaluation. 3. The patient is to follow-up with primary care physician Dr. Gilford Rile regarding blood pressure and general medical condition status post lumbar epidural steroid injection performed on today's visit. 4. Surgical evaluation.. We have scheduled neurosurgical reevaluation at this time 5. Neurological evaluation and Psych evaluation for elements of depression as discussed 6. The patient may be a candidate for radiofrequency procedures, implantation device, and other treatment pending response to treatment and follow-up evaluation. 7. The patient has been advised to adhere to proper body mechanics and avoid activities which appear to aggravate condition. 8. The patient has been advised to call the Pain Management [facility] prior to scheduled return appointment should there be significant change in condition or should there be sign  The patient is understanding and agrees with the suggested  treatment plan   Review of Systems     Objective:   Physical Exam        Assessment & Plan:

## 2014-10-22 NOTE — Progress Notes (Signed)
Safety precautions to be maintained throughout the outpatient stay will include: orient to surroundings, keep bed in low position, maintain call bell within reach at all times, provide assistance with transfer out of bed and ambulation.  

## 2014-10-22 NOTE — Patient Instructions (Addendum)
Continue present medications and obtain your antibiotic Ceftin and begin taking Ceftin today  F/U PCP Dr. Gilford Rile for evaliation of  BP and general medical  condition.  F/U surgical evaluation  F/U neurological evaluation  Psych evaluation as discussed  May consider radiofrequency rhizolysis or intraspinal procedures pending response to present treatment and F/U evaluation.  Patient to call Pain Management Center should patient have concerns prior to scheduled return appointment.

## 2014-10-23 ENCOUNTER — Telehealth: Payer: Self-pay | Admitting: *Deleted

## 2014-10-23 NOTE — Telephone Encounter (Signed)
Denies complications post procedure. 

## 2014-11-03 ENCOUNTER — Other Ambulatory Visit: Payer: Self-pay | Admitting: Nurse Practitioner

## 2014-11-03 ENCOUNTER — Other Ambulatory Visit: Payer: Self-pay | Admitting: Internal Medicine

## 2014-11-22 ENCOUNTER — Encounter: Payer: Self-pay | Admitting: Pain Medicine

## 2014-11-22 ENCOUNTER — Ambulatory Visit: Payer: 59 | Attending: Pain Medicine | Admitting: Pain Medicine

## 2014-11-22 VITALS — BP 124/56 | HR 82 | Temp 98.0°F | Resp 18 | Ht 60.0 in | Wt 162.0 lb

## 2014-11-22 DIAGNOSIS — M5126 Other intervertebral disc displacement, lumbar region: Secondary | ICD-10-CM | POA: Insufficient documentation

## 2014-11-22 DIAGNOSIS — M5416 Radiculopathy, lumbar region: Secondary | ICD-10-CM | POA: Insufficient documentation

## 2014-11-22 DIAGNOSIS — M5136 Other intervertebral disc degeneration, lumbar region: Secondary | ICD-10-CM | POA: Insufficient documentation

## 2014-11-22 DIAGNOSIS — M533 Sacrococcygeal disorders, not elsewhere classified: Secondary | ICD-10-CM | POA: Insufficient documentation

## 2014-11-22 DIAGNOSIS — M4804 Spinal stenosis, thoracic region: Secondary | ICD-10-CM | POA: Diagnosis not present

## 2014-11-22 DIAGNOSIS — M47816 Spondylosis without myelopathy or radiculopathy, lumbar region: Secondary | ICD-10-CM

## 2014-11-22 DIAGNOSIS — M79605 Pain in left leg: Secondary | ICD-10-CM | POA: Diagnosis present

## 2014-11-22 DIAGNOSIS — G5710 Meralgia paresthetica, unspecified lower limb: Secondary | ICD-10-CM | POA: Diagnosis not present

## 2014-11-22 DIAGNOSIS — M79604 Pain in right leg: Secondary | ICD-10-CM | POA: Diagnosis present

## 2014-11-22 DIAGNOSIS — M4806 Spinal stenosis, lumbar region: Secondary | ICD-10-CM | POA: Insufficient documentation

## 2014-11-22 DIAGNOSIS — M48062 Spinal stenosis, lumbar region with neurogenic claudication: Secondary | ICD-10-CM | POA: Insufficient documentation

## 2014-11-22 DIAGNOSIS — M545 Low back pain: Secondary | ICD-10-CM | POA: Diagnosis present

## 2014-11-22 DIAGNOSIS — G5790 Unspecified mononeuropathy of unspecified lower limb: Secondary | ICD-10-CM

## 2014-11-22 MED ORDER — HYDROCODONE-ACETAMINOPHEN 5-325 MG PO TABS: ORAL_TABLET | ORAL | Status: DC

## 2014-11-22 NOTE — Patient Instructions (Addendum)
Continue present medications We  will begin hydrocodone acetaminophen today. This medication can cause respiratory depression confusion excessive sedation and other side effects. Please exercise extreme caution when taken hydrocodone acetaminophen and other medications  Lumbosacral selective nerve Pain Management Discharge Instructions  General Discharge Instructions :  If you need to reach your doctor call: Monday-Friday 8:00 am - 4:00 pm at [phone] or toll free [phone].  After clinic hours [phone] to have operator reach doctor.  Bring all of your medication bottles to all your appointments in the pain clinic.  To cancel or reschedule your appointment with Pain Management please remember to call 24 hours in advance to avoid a fee.  Refer to the educational materials which you have been given on: General Risks, I had my Procedure. Discharge Instructions, Post Sedation.  Post Procedure Instructions:  The drugs you were given will stay in your system until tomorrow, so for the next 24 hours you should not drive, make any legal decisions or drink any alcoholic beverages.  You may eat anything you prefer, but it is better to start with liquids then soups and crackers, and gradually work up to solid foods.  Please notify your doctor immediately if you have any unusual bleeding, trouble breathing or pain that is not related to your normal pain.  Depending on the type of procedure that was done, some parts of your body may feel week and/or numb.  This usually clears up by tonight or the next day.  Walk with the use of an assistive device or accompanied by an adult for the 24 hours.  You may use ice on the affected area for the first 24 hours.  Put ice in a Ziploc bag and cover with a towel and place against area 15 minutes on 15 minutes off.  You may switch to heat after 24 hours.GENERAL RISKS AND COMPLICATIONS  What are the risk, side effects and possible  complications? Generally speaking, most procedures are safe.  However, with any procedure there are risks, side effects, and the possibility of complications.  The risks and complications are dependent upon the sites that are lesioned, or the type of nerve block to be performed.  The closer the procedure is to the spine, the more serious the risks are.  Great care is taken when placing the radio frequency needles, block needles or lesioning probes, but sometimes complications can occur. 1. Infection: Any time there is an injection through the skin, there is a risk of infection.  This is why sterile conditions are used for these blocks.  There are four possible types of infection. 1. Localized skin infection. 2. Central Nervous System Infection-This can be in the form of Meningitis, which can be deadly. 3. Epidural Infections-This can be in the form of an epidural abscess, which can cause pressure inside of the spine, causing compression of the spinal cord with subsequent paralysis. This would require an emergency surgery to decompress, and there are no guarantees that the patient would recover from the paralysis. 4. Discitis-This is an infection of the intervertebral discs.  It occurs in about 1% of discography procedures.  It is difficult to treat and it may lead to surgery.        2. Pain: the needles have to go through skin and soft tissues, will cause soreness.       3. Damage to internal structures:  The nerves to be lesioned may be near blood vessels or    other nerves which can be potentially  damaged.       4. Bleeding: Bleeding is more common if the patient is taking blood thinners such as  aspirin, Coumadin, Ticiid, Plavix, etc., or if he/she have some genetic predisposition  such as hemophilia. Bleeding into the spinal canal can cause compression of the spinal  cord with subsequent paralysis.  This would require an emergency surgery to  decompress and there are no guarantees that the patient  would recover from the  paralysis.       5. Pneumothorax:  Puncturing of a lung is a possibility, every time a needle is introduced in  the area of the chest or upper back.  Pneumothorax refers to free air around the  collapsed lung(s), inside of the thoracic cavity (chest cavity).  Another two possible  complications related to a similar event would include: Hemothorax and Chylothorax.   These are variations of the Pneumothorax, where instead of air around the collapsed  lung(s), you may have blood or chyle, respectively.       6. Spinal headaches: They may occur with any procedures in the area of the spine.       7. Persistent CSF (Cerebro-Spinal Fluid) leakage: This is a rare problem, but may occur  with prolonged intrathecal or epidural catheters either due to the formation of a fistulous  track or a dural tear.       8. Nerve damage: By working so close to the spinal cord, there is always a possibility of  nerve damage, which could be as serious as a permanent spinal cord injury with  paralysis.       9. Death:  Although rare, severe deadly allergic reactions known as "Anaphylactic  reaction" can occur to any of the medications used.      10. Worsening of the symptoms:  We can always make thing worse.  What are the chances of something like this happening? Chances of any of this occuring are extremely low.  By statistics, you have more of a chance of getting killed in a motor vehicle accident: while driving to the hospital than any of the above occurring .  Nevertheless, you should be aware that they are possibilities.  In general, it is similar to taking a shower.  Everybody knows that you can slip, hit your head and get killed.  Does that mean that you should not shower again?  Nevertheless always keep in mind that statistics do not mean anything if you happen to be on the wrong side of them.  Even if a procedure has a 1 (one) in a 1,000,000 (million) chance of going wrong, it you happen to be that  one..Also, keep in mind that by statistics, you have more of a chance of having something go wrong when taking medications.  Who should not have this procedure? If you are on a blood thinning medication (e.g. Coumadin, Plavix, see list of "Blood Thinners"), or if you have an active infection going on, you should not have the procedure.  If you are taking any blood thinners, please inform your physician.  How should I prepare for this procedure?  Do not eat or drink anything at least six hours prior to the procedure.  Bring a driver with you .  It cannot be a taxi.  Come accompanied by an adult that can drive you back, and that is strong enough to help you if your legs get weak or numb from the local anesthetic.  Take all of your medicines the morning  of the procedure with just enough water to swallow them.  If you have diabetes, make sure that you are scheduled to have your procedure done first thing in the morning, whenever possible.  If you have diabetes, take only half of your insulin dose and notify our nurse that you have done so as soon as you arrive at the clinic.  If you are diabetic, but only take blood sugar pills (oral hypoglycemic), then do not take them on the morning of your procedure.  You may take them after you have had the procedure.  Do not take aspirin or any aspirin-containing medications, at least eleven (11) days prior to the procedure.  They may prolong bleeding.  Wear loose fitting clothing that may be easy to take off and that you would not mind if it got stained with Betadine or blood.  Do not wear any jewelry or perfume  Remove any nail coloring.  It will interfere with some of our monitoring equipment.  NOTE: Remember that this is not meant to be interpreted as a complete list of all possible complications.  Unforeseen problems may occur.  BLOOD THINNERS The following drugs contain aspirin or other products, which can cause increased bleeding during  surgery and should not be taken for 2 weeks prior to and 1 week after surgery.  If you should need take something for relief of minor pain, you may take acetaminophen which is found in Tylenol,m Datril, Anacin-3 and Panadol. It is not blood thinner. The products listed below are.  Do not take any of the products listed below in addition to any listed on your instruction sheet.  A.P.C or A.P.C with Codeine Codeine Phosphate Capsules #3 Ibuprofen Ridaura  ABC compound Congesprin Imuran rimadil  Advil Cope Indocin Robaxisal  Alka-Seltzer Effervescent Pain Reliever and Antacid Coricidin or Coricidin-D  Indomethacin Rufen  Alka-Seltzer plus Cold Medicine Cosprin Ketoprofen S-A-C Tablets  Anacin Analgesic Tablets or Capsules Coumadin Korlgesic Salflex  Anacin Extra Strength Analgesic tablets or capsules CP-2 Tablets Lanoril Salicylate  Anaprox Cuprimine Capsules Levenox Salocol  Anexsia-D Dalteparin Magan Salsalate  Anodynos Darvon compound Magnesium Salicylate Sine-off  Ansaid Dasin Capsules Magsal Sodium Salicylate  Anturane Depen Capsules Marnal Soma  APF Arthritis pain formula Dewitt's Pills Measurin Stanback  Argesic Dia-Gesic Meclofenamic Sulfinpyrazone  Arthritis Bayer Timed Release Aspirin Diclofenac Meclomen Sulindac  Arthritis pain formula Anacin Dicumarol Medipren Supac  Analgesic (Safety coated) Arthralgen Diffunasal Mefanamic Suprofen  Arthritis Strength Bufferin Dihydrocodeine Mepro Compound Suprol  Arthropan liquid Dopirydamole Methcarbomol with Aspirin Synalgos  ASA tablets/Enseals Disalcid Micrainin Tagament  Ascriptin Doan's Midol Talwin  Ascriptin A/D Dolene Mobidin Tanderil  Ascriptin Extra Strength Dolobid Moblgesic Ticlid  Ascriptin with Codeine Doloprin or Doloprin with Codeine Momentum Tolectin  Asperbuf Duoprin Mono-gesic Trendar  Aspergum Duradyne Motrin or Motrin IB Triminicin  Aspirin plain, buffered or enteric coated Durasal Myochrisine Trigesic  Aspirin  Suppositories Easprin Nalfon Trillsate  Aspirin with Codeine Ecotrin Regular or Extra Strength Naprosyn Uracel  Atromid-S Efficin Naproxen Ursinus  Auranofin Capsules Elmiron Neocylate Vanquish  Axotal Emagrin Norgesic Verin  Azathioprine Empirin or Empirin with Codeine Normiflo Vitamin E  Azolid Emprazil Nuprin Voltaren  Bayer Aspirin plain, buffered or children's or timed BC Tablets or powders Encaprin Orgaran Warfarin Sodium  Buff-a-Comp Enoxaparin Orudis Zorpin  Buff-a-Comp with Codeine Equegesic Os-Cal-Gesic   Buffaprin Excedrin plain, buffered or Extra Strength Oxalid   Bufferin Arthritis Strength Feldene Oxphenbutazone   Bufferin plain or Extra Strength Feldene Capsules Oxycodone with Aspirin   Bufferin  with Codeine Fenoprofen Fenoprofen Pabalate or Pabalate-SF   Buffets II Flogesic Panagesic   Buffinol plain or Extra Strength Florinal or Florinal with Codeine Panwarfarin   Buf-Tabs Flurbiprofen Penicillamine   Butalbital Compound Four-way cold tablets Penicillin   Butazolidin Fragmin Pepto-Bismol   Carbenicillin Geminisyn Percodan   Carna Arthritis Reliever Geopen Persantine   Carprofen Gold's salt Persistin   Chloramphenicol Goody's Phenylbutazone   Chloromycetin Haltrain Piroxlcam   Clmetidine heparin Plaquenil   Cllnoril Hyco-pap Ponstel   Clofibrate Hydroxy chloroquine Propoxyphen         Before stopping any of these medications, be sure to consult the physician who ordered them.  Some, such as Coumadin (Warfarin) are ordered to prevent or treat serious conditions such as "deep thrombosis", "pumonary embolisms", and other heart problems.  The amount of time that you may need off of the medication may also vary with the medication and the reason for which you were taking it.  If you are taking any of these medications, please make sure you notify your pain physician before you undergo any procedures.         Selective Nerve Root Block Patient  Information  Description: Specific nerve roots exit the spinal canal and these nerves can be compressed and inflamed by a bulging disc and bone spurs.  By injecting steroids on the nerve root, we can potentially decrease the inflammation surrounding these nerves, which often leads to decreased pain.  Also, by injecting local anesthesia on the nerve root, this can provide [place] helpful information to give to your referring doctor if it decreases your pain.  Selective nerve root blocks can be done along the spine from the neck to the low back depending on the location of your pain.   After numbing the skin with local anesthesia, a small needle is passed to the nerve root and the position of the needle is verified using x-ray pictures.  After the needle is in correct position, we then deposit the medication.  You may experience a pressure sensation while this is being done.  The entire block usually lasts less than 15 minutes.  Conditions that may be treated with selective nerve root blocks:  Low back and leg pain  Spinal stenosis  Diagnostic block prior to potential surgery  Neck and arm pain  Post laminectomy syndrome  Preparation for the injection:  1. Do not eat any solid food or dairy products within 6 hours of your appointment. 2. You may drink clear liquids up to 2 hours before an appointment.  Clear liquids include water, black coffee, juice or soda.  No milk or cream please. 3. You may take your regular medications, including pain medications, with a sip of water before your appointment.  Diabetics should hold regular insulin (if taken separately) and take 1/2 normal NPH dose the morning of the procedure.  Carry some sugar containing items with you to your appointment. 4. A driver must accompany you and be prepared to drive you home after your procedure. 5. Bring all your current medications with you. 6. An IV may be inserted and sedation may be given at the discretion of the  physician. 7. A blood pressure cuff, EKG, and other monitors will often be applied during the procedure.  Some patients may need to have extra oxygen administered for a short period. 8. You will be asked to provide medical information, including allergies, prior to the procedure.  We must know immediately if you are taking blood  Thinners (like Coumadin)  or if you are allergic to IV iodine contrast (dye).  Possible side-effects: All are usually temporary  Bleeding from needle site  Light headedness  Numbness and tingling  Decreased blood pressure  Weakness in arms/legs  Pressure sensation in back/neck  Pain at injection site (several days)  Possible complications: All are extremely rare  Infection  Nerve injury  Spinal headache (a headache wore with upright position)  Call if you experience:  Fever/chills associated with headache or increased back/neck pain  Headache worsened by an upright position  New onset weakness or numbness of an extremity below the injection site  Hives or difficulty breathing (go to the emergency room)  Inflammation or drainage at the injection site(s)  Severe back/neck pain greater than usual  New symptoms which are concerning to you  Please note:  Although the local anesthetic injected can often make your back or neck feel good for several hours after the injection the pain will likely return.  It takes 3-5 days for steroids to work on the nerve root. You may not notice any pain relief for at least one week.  If effective, we will often do a series of 3 injections spaced 3-6 weeks apart to maximally decrease your pain.    If you have any questions, please call [phone] [facility] to be performed at time of return appointment  F/U PCP Dr. Lisette Grinder III for evaliation of  BP and general medical  condition  F/U surgical evaluation. May consider pending follow-up evaluations  F/U  neurological evaluation. May consider pending follow-up evaluations  May consider radiofrequency rhizolysis or intraspinal procedures pending response to present treatment and F/U evaluation   Patient to call Pain Management Center should patient have concerns prior to scheduled return appointment.

## 2014-11-22 NOTE — Progress Notes (Signed)
Safety precautions to be maintained throughout the outpatient stay will include: orient to surroundings, keep bed in low position, maintain call bell within reach at all times, provide assistance with transfer out of bed and ambulation.  

## 2014-11-22 NOTE — Progress Notes (Signed)
Subjective:    Patient ID: Hannah [name], female    DOB: 07/21/1950, 64 y.o.   MRN: [id number]  HPI Patient is 64 year old female returns to [facility] for further evaluation and treatment of pain involving the lower back lower extremity region predominantly. Patient with pain involving the neck of lesser degree. On today's visit patient states that she has significant pain involving the right lower extremity times as well as the left lower extremity. Patient admits to pain involving the inguinal region on the right. Patient states that pain of the lower extremities can occur suddenly without known precipitating movements or events. We discussed patient's condition and will consider patient for lumbosacral selective nerve root block at time return appointment in attempt to decrease severity of patient's symptoms, minimize progression of symptoms, and avoid the need for more involved treatment. The patient was with understanding and in agreement status treatment plan. We also began hydrocodone acetaminophen 5/325 mg limit 1-2 tablets per day. We will consider additional modification of treatment regimen pending follow-up evaluation and will proceed with lumbosacral selective nerve root block to be performed at time return appointment. The patient was understanding and agreement status treatment plan.    Review of Systems     Objective:   Physical Exam  there was mild tenderness of the splenius capitis and occipitalis musculature region. Palpation of the acromioclavicular and glenohumeral joint region was without increased pain of significant degree. Palpation over the cervical facet cervical paraspinal musculature region reproduced mild discomfort. There was mild tinnitus of the thoracic facet thoracic paraspinal musculature region. Patient appeared to be with bilaterally equal grip strength. Tinel and Phalen's maneuver were without increase of pain of significant degree. Palpation over  the lumbar paraspinal muscles region lumbar facet region associated with moderate to moderately severe discomfort. There was mild to moderate tends to palpation of the right inguinal region. No palpable masses were noted. Straight leg raising was tolerated to approximately 20 without increased pain dorsiflexion noted there was negative clonus negative Homans. Lateral bending and rotation and extension to palpation lumbar facets reproduce moderate to moderately severe discomfort. There was moderate tenderness of the PSIS and PII S regions. Palpation of the gluteal and piriformis musculature region was with mild to moderate discomfort. No definite sensory deficit of dermatomal distribution was detected. Abdomen soft nontender and no costovertebral maintenance noted.       Assessment & Plan:    Degenerative disc disease lumbar spine Kyphotic curvature at the 1112 disc space, spondylosis and shallow disc bulge at this level resulting in mild canal and moderate bilateral foraminal stenosis and mild anterior and superior T12 upper endplate depression, lack of marrow edema indicative of chronic change with mild osseous and disc changes at the remaining levels with adrenal nodule. Disc desiccation central right with the central disc bulge resulting and right foraminal narrowing L3-4 level with moderate left and right facet arthrosis. L4-L5 disc desiccation. Shallow broad-based disc bulging and narrowing of the central canal and left foramen at L4-L5 level  Lumbar facet syndrome  Lumbar stenosis with neurogenic claudication   Sacroiliac joint dysfunction  Meralgia paresthetica  Lumbar radiculopathy   PLAN   Continue present medications. Patient was given prescription for hydrocodone acetaminophen 5/325 limit 1-2 tablets per day  Lumbosacral selective nerve root block to be performed at time return appointment  F/U PCP Dr. Ronette Deter for evaliation of  BP and general medical   condition  F/U surgical evaluation. May consider pending follow-up evaluations  F/U neurological evaluation. May consider pending follow-up evaluations  May consider radiofrequency rhizolysis or intraspinal procedures pending response to present treatment and F/U evaluation   Patient to call Pain Management Center should patient have concerns prior to scheduled return appointment.

## 2014-11-23 ENCOUNTER — Other Ambulatory Visit: Payer: Self-pay | Admitting: Internal Medicine

## 2014-11-23 ENCOUNTER — Telehealth: Payer: Self-pay | Admitting: *Deleted

## 2014-11-23 ENCOUNTER — Other Ambulatory Visit: Payer: Self-pay | Admitting: Nurse Practitioner

## 2014-11-23 NOTE — Telephone Encounter (Signed)
Dr. Gilford Rile,  I have refilled the Gabapentin.  Please advise on Shingles Vaccine? Thanks

## 2014-11-23 NOTE — Telephone Encounter (Signed)
Patient has requested a shingles injection Rx to be sent to the pharmacy. She also requested a refill on her  Gavapenin 300 mg. -Thanks

## 2014-11-26 NOTE — Telephone Encounter (Signed)
Fine for shingles vaccine

## 2014-11-27 ENCOUNTER — Other Ambulatory Visit: Payer: Self-pay

## 2014-11-27 MED ORDER — ZOSTER VACCINE LIVE 19400 UNT/0.65ML ~~LOC~~ SOLR: 0.6500 mL | Freq: Once | SUBCUTANEOUS | Status: DC

## 2014-11-27 NOTE — Telephone Encounter (Signed)
Spoke with patient, she will get the vaccine once cleared due to her steroid injections from Dr. Primus Bravo.  I have scheduled her a follow up appointment with you.

## 2014-12-06 ENCOUNTER — Ambulatory Visit (INDEPENDENT_AMBULATORY_CARE_PROVIDER_SITE_OTHER): Payer: 59 | Admitting: Family Medicine

## 2014-12-06 ENCOUNTER — Encounter: Payer: Self-pay | Admitting: Family Medicine

## 2014-12-06 ENCOUNTER — Ambulatory Visit: Payer: 59 | Admitting: Internal Medicine

## 2014-12-06 VITALS — BP 130/82 | HR 120 | Temp 98.7°F | Ht 60.0 in | Wt 152.1 lb

## 2014-12-06 DIAGNOSIS — R197 Diarrhea, unspecified: Secondary | ICD-10-CM

## 2014-12-06 DIAGNOSIS — E860 Dehydration: Secondary | ICD-10-CM

## 2014-12-06 DIAGNOSIS — E042 Nontoxic multinodular goiter: Secondary | ICD-10-CM | POA: Insufficient documentation

## 2014-12-06 DIAGNOSIS — R252 Cramp and spasm: Secondary | ICD-10-CM | POA: Diagnosis not present

## 2014-12-06 LAB — COMPREHENSIVE METABOLIC PANEL
ALBUMIN: 4.6 g/dL (ref 3.5–5.2)
ALT: 19 U/L (ref 0–35)
AST: 21 U/L (ref 0–37)
Alkaline Phosphatase: 99 U/L (ref 39–117)
BUN: 18 mg/dL (ref 6–23)
CALCIUM: 10.3 mg/dL (ref 8.4–10.5)
CHLORIDE: 98 meq/L (ref 96–112)
CO2: 24 meq/L (ref 19–32)
CREATININE: 1.16 mg/dL (ref 0.40–1.20)
GFR: 50.17 mL/min — ABNORMAL LOW (ref >60.00)
Glucose, Bld: 90 mg/dL (ref 70–99)
POTASSIUM: 4.7 meq/L (ref 3.5–5.1)
Sodium: 137 mEq/L (ref 135–145)
Total Bilirubin: 0.8 mg/dL (ref 0.2–1.2)
Total Protein: 7.7 g/dL (ref 6.0–8.3)

## 2014-12-06 LAB — CBC
HEMATOCRIT: 47.3 % — AB (ref 36.0–46.0)
Hemoglobin: 15.9 g/dL — ABNORMAL HIGH (ref 12.0–15.0)
MCHC: 33.7 g/dL (ref 30.0–36.0)
MCV: 91.1 fl (ref 78.0–100.0)
PLATELETS: 356 10*3/uL (ref 150.0–400.0)
RBC: 5.19 Mil/uL — ABNORMAL HIGH (ref 3.87–5.11)
RDW: 13.7 % (ref 11.5–15.5)
WBC: 11.2 10*3/uL — ABNORMAL HIGH (ref 4.0–10.5)

## 2014-12-06 LAB — LIPASE: LIPASE: 75 U/L — AB (ref 11.0–59.0)

## 2014-12-06 LAB — TSH: TSH: 1.09 u[IU]/mL (ref 0.35–4.50)

## 2014-12-06 MED ORDER — CYCLOBENZAPRINE HCL 10 MG PO TABS: 10.0000 mg | ORAL_TABLET | Freq: Three times a day (TID) | ORAL | Status: DC | PRN

## 2014-12-06 NOTE — Assessment & Plan Note (Signed)
Worsening in the setting of dehydration. Advised IV fluids at ED or urgent care. PRN Flexeril. Rx given.

## 2014-12-06 NOTE — Progress Notes (Signed)
Subjective:  Patient ID: Hannah [name], female    DOB: Mar 01, 1951  Age: 64 y.o. MRN: [id number]  CC: Diarrhea, Thyroid nodules, Muscle cramps  HPI:  64 year old female with a past medical history of degenerative disc and chronic pain presents to clinic today with complaints of diarrhea, muscle cramps, and thyroid nodules.  1) Diarrhea  Patient reports that she has had diarrhea for approximately 2 weeks.  She stated that this started after she and her sister got a viral illness.  Her sister symptoms quickly resolved in a few days but hers persisted.  She continues to have loose stool. She reports associated decrease in appetite. She has had some abdominal pain as well.  No recent fevers or chills. She denies any recent antibiotic use, however there was some antibiotic documented in August per the EMR.  No exacerbating or relieving factors.  Patient states that she has been eating and drinking very little.  2) Muscle cramps  Patient reports a long-standing history of muscle cramps.  These have recently worsened.  No exacerbating or relieving factors.  No interventions tried other than over-the-counter Aleve.  3) Thyroid nodules  Patient has a history of thyroid nodules. She had them biopsied previously and they were benign.  Patient reports that she is currently experiencing pain related to her thyroid nodules. She would like to see the specialist that she saw previously.  Social Hx   Social History   Social History  . Marital Status: Widowed    Spouse Name: N/A  . Number of Children: N/A  . Years of Education: N/A   Social History Main Topics  . Smoking status: Never Smoker   . Smokeless tobacco: Never Used  . Alcohol Use: No  . Drug Use: No  . Sexual Activity: Not Asked   Other Topics Concern  . None   Social History Narrative   Lives with nephew in [place]. Helps with care of 2 grand-nephews.   Husband deceased.      Work - previously as  Geophysicist/field seismologist   Review of Systems  Constitutional: Positive for appetite change. Negative for fever and chills.  Gastrointestinal: Positive for abdominal pain and diarrhea.   Objective:  BP 130/82 mmHg  Pulse 120  Temp(Src) 98.7 F (37.1 C) (Oral)  Ht 5' (1.524 m)  Wt 152 lb 2 oz (69.003 kg)  BMI 29.71 kg/m2  SpO2 94%  BP/Weight 12/06/2014 09/20/2456 0/11/9831  Systolic BP 825 053 976  Diastolic BP 82 56 69  Wt. (Lbs) 152.13 162 162  BMI 29.71 31.64 31.64   Physical Exam  Constitutional:  Appears fatigued and mildly ill. In no acute distress.  HENT:  Dry mucous membranes.  Cardiovascular:  Tachycardic. Regular rhythm.   Pulmonary/Chest: Effort normal. No respiratory distress. She has no wheezes. She has no rales.  Abdominal: Soft.  Tender to palpation in the epigastrium.  Neurological: She is alert.  Psychiatric:  Flat affect.  Vitals reviewed.  Lab Results  Component Value Date   WBC 8.8 07/14/2013   HGB 12.6 07/14/2013   HCT 38.0 07/14/2013   PLT 433.0* 07/14/2013   GLUCOSE 85 05/14/2014   CHOL 218* 04/10/2013   TRIG 75.0 04/10/2013   HDL 62.30 04/10/2013   LDLDIRECT 137.9 04/10/2013   ALT 12 05/14/2014   AST 15 05/14/2014   NA 140 05/14/2014   K 4.9 05/14/2014   CL 104 05/14/2014   CREATININE 1.19 05/14/2014   BUN 20 05/14/2014   CO2 31 05/14/2014  TSH 1.16 04/10/2013   MICROALBUR 3.2* 04/10/2013    Assessment & Plan:   Problem List Items Addressed This Visit    Diarrhea - Primary    Will obtain C diff. Advised patient that she needed to go to Urgent care or ED for IV fluids given marked dehydration.      Relevant Orders   CBC   Comp Met (CMET)   TSH   Lipase   Multiple thyroid nodules    Encouraged follow up with prior endocrinologist @ [facility]. I gave her their info today.      Muscle cramps    Worsening in the setting of dehydration. Advised IV fluids at ED or urgent care. PRN Flexeril. Rx given.       Other Visit  Diagnoses    Dehydration        Relevant Orders    CBC    Comp Met (CMET)    TSH    Lipase       Meds ordered this encounter  Medications  . cyclobenzaprine (FLEXERIL) 10 MG tablet    Sig: Take 1 tablet (10 mg total) by mouth 3 (three) times daily as needed for muscle spasms.    Dispense:  30 tablet    Refill:  0   Follow-up: PRN with PCP.  Thersa Salt, DO

## 2014-12-06 NOTE — Progress Notes (Signed)
Pre visit review using our clinic review tool, if applicable. No additional management support is needed unless otherwise documented below in the visit note. 

## 2014-12-06 NOTE — Patient Instructions (Signed)
Please go to the hospital or urgent care (in [facility]) for IV fluids.  Below is the information from your Endocrinologist; you shouldn't need a referral. [place], [place] [address] [phone]  We will obtain a stool test regarding your diarrhea.  Follow up closely with Dr. Gilford Rile  Take care  Dr. Lacinda Axon

## 2014-12-06 NOTE — Assessment & Plan Note (Signed)
Encouraged follow up with prior endocrinologist @ [facility]. I gave her their info today.

## 2014-12-06 NOTE — Assessment & Plan Note (Signed)
Will obtain C diff. Advised patient that she needed to go to Urgent care or ED for IV fluids given marked dehydration.

## 2014-12-07 ENCOUNTER — Telehealth: Payer: Self-pay

## 2014-12-07 ENCOUNTER — Other Ambulatory Visit: Payer: Self-pay | Admitting: *Deleted

## 2014-12-07 ENCOUNTER — Other Ambulatory Visit: Payer: Self-pay | Admitting: Family Medicine

## 2014-12-07 DIAGNOSIS — R197 Diarrhea, unspecified: Secondary | ICD-10-CM

## 2014-12-07 NOTE — Telephone Encounter (Signed)
Patient should go to the ED for fluids and potential imaging.

## 2014-12-07 NOTE — Telephone Encounter (Signed)
Pt was called back and i left another vociemail.

## 2014-12-07 NOTE — Telephone Encounter (Signed)
Patient returned phone call regarding lab results. Patient states that she went to the Urgent Care in [place], and they refused to give her fluids.  Patient states that she is still having sharp pains in her stomach, even though she has been eating and drinking more. Patient wants to know what she should do from here. Please advise. Thanks!

## 2014-12-08 LAB — C. DIFFICILE GDH AND TOXIN A/B
C. DIFF TOXIN A/B: NOT DETECTED
C. difficile GDH: NOT DETECTED

## 2014-12-25 ENCOUNTER — Telehealth: Payer: Self-pay | Admitting: Internal Medicine

## 2014-12-25 ENCOUNTER — Other Ambulatory Visit: Payer: Self-pay

## 2014-12-25 ENCOUNTER — Ambulatory Visit: Payer: 59 | Attending: Pain Medicine | Admitting: Pain Medicine

## 2014-12-25 ENCOUNTER — Encounter: Payer: Self-pay | Admitting: Pain Medicine

## 2014-12-25 ENCOUNTER — Telehealth: Payer: Self-pay | Admitting: *Deleted

## 2014-12-25 VITALS — BP 114/47 | HR 87 | Temp 98.6°F | Resp 16 | Ht 60.0 in | Wt 152.0 lb

## 2014-12-25 DIAGNOSIS — M5126 Other intervertebral disc displacement, lumbar region: Secondary | ICD-10-CM | POA: Diagnosis not present

## 2014-12-25 DIAGNOSIS — M4806 Spinal stenosis, lumbar region: Secondary | ICD-10-CM | POA: Insufficient documentation

## 2014-12-25 DIAGNOSIS — G5710 Meralgia paresthetica, unspecified lower limb: Secondary | ICD-10-CM | POA: Insufficient documentation

## 2014-12-25 DIAGNOSIS — M5136 Other intervertebral disc degeneration, lumbar region: Secondary | ICD-10-CM | POA: Diagnosis not present

## 2014-12-25 DIAGNOSIS — G5790 Unspecified mononeuropathy of unspecified lower limb: Secondary | ICD-10-CM

## 2014-12-25 DIAGNOSIS — M47816 Spondylosis without myelopathy or radiculopathy, lumbar region: Secondary | ICD-10-CM | POA: Diagnosis not present

## 2014-12-25 DIAGNOSIS — M79605 Pain in left leg: Secondary | ICD-10-CM | POA: Diagnosis present

## 2014-12-25 DIAGNOSIS — M48062 Spinal stenosis, lumbar region with neurogenic claudication: Secondary | ICD-10-CM

## 2014-12-25 DIAGNOSIS — M533 Sacrococcygeal disorders, not elsewhere classified: Secondary | ICD-10-CM | POA: Diagnosis not present

## 2014-12-25 DIAGNOSIS — M79659 Pain in unspecified thigh: Secondary | ICD-10-CM

## 2014-12-25 DIAGNOSIS — M545 Low back pain: Secondary | ICD-10-CM | POA: Diagnosis present

## 2014-12-25 DIAGNOSIS — M79604 Pain in right leg: Secondary | ICD-10-CM

## 2014-12-25 DIAGNOSIS — R252 Cramp and spasm: Secondary | ICD-10-CM

## 2014-12-25 MED ORDER — GABAPENTIN 300 MG PO CAPS: 600.0000 mg | ORAL_CAPSULE | Freq: Two times a day (BID) | ORAL | Status: DC

## 2014-12-25 MED ORDER — HYDROCODONE-ACETAMINOPHEN 5-325 MG PO TABS: ORAL_TABLET | ORAL | Status: DC

## 2014-12-25 NOTE — Telephone Encounter (Signed)
Fine for Dr. Primus Bravo to write pain medication. Does she want a referral for counseling or psychiatric care? Should be seen in a 1min visit to discuss

## 2014-12-25 NOTE — Patient Instructions (Addendum)
PLAN   Continue present medication hydrocodone acetaminophen  Lumbosacral selective nerve root block to be performed at time of return appointment  F/U PCP Dr. Ronette Deter for evaliation of  BP and general medical  condition and discuss psych evaluation with Dr. Gilford Rile as well  F/U surgical evaluation. May consider pending follow-up evaluations  F/U neurological evaluation. May consider pending follow-up evaluations  Please ask Caryl Pina the date of your psych evaluation  May consider radiofrequency rhizolysis or intraspinal procedures pending response to present treatment and F/U evaluation   [facility] to [facility] should patient have concerns prior to scheduled return appointment. Selective Nerve Root Block Patient Information  Description: Specific nerve roots exit the spinal canal and these nerves can be compressed and inflamed by a bulging disc and bone spurs.  By injecting steroids on the nerve root, we can potentially decrease the inflammation surrounding these nerves, which often leads to decreased pain.  Also, by injecting local anesthesia on the nerve root, this can provide [place] helpful information to give to your referring doctor if it decreases your pain.  Selective nerve root blocks can be done along the spine from the neck to the low back depending on the location of your pain.   After numbing the skin with local anesthesia, a small needle is passed to the nerve root and the position of the needle is verified using x-ray pictures.  After the needle is in correct position, we then deposit the medication.  You may experience a pressure sensation while this is being done.  The entire block usually lasts less than 15 minutes.  Conditions that may be treated with selective nerve root blocks:  Low back and leg pain  Spinal stenosis  Diagnostic block prior to potential surgery  Neck and arm pain  Post laminectomy syndrome  Preparation for the  injection:  1. Do not eat any solid food or dairy products within 6 hours of your appointment. 2. You may drink clear liquids up to 2 hours before an appointment.  Clear liquids include water, black coffee, juice or soda.  No milk or cream please. 3. You may take your regular medications, including pain medications, with a sip of water before your appointment.  Diabetics should hold regular insulin (if taken separately) and take 1/2 normal NPH dose the morning of the procedure.  Carry some sugar containing items with you to your appointment. 4. A driver must accompany you and be prepared to drive you home after your procedure. 5. Bring all your current medications with you. 6. An IV may be inserted and sedation may be given at the discretion of the physician. 7. A blood pressure cuff, EKG, and other monitors will often be applied during the procedure.  Some patients may need to have extra oxygen administered for a short period. 8. You will be asked to provide medical information, including allergies, prior to the procedure.  We must know immediately if you are taking blood  Thinners (like Coumadin) or if you are allergic to IV iodine contrast (dye).  Possible side-effects: All are usually temporary  Bleeding from needle site  Light headedness  Numbness and tingling  Decreased blood pressure  Weakness in arms/legs  Pressure sensation in back/neck  Pain at injection site (several days)  Possible complications: All are extremely rare  Infection  Nerve injury  Spinal headache (a headache wore with upright position)  Call if you experience:  Fever/chills associated with headache or increased back/neck pain  Headache  worsened by an upright position  New onset weakness or numbness of an extremity below the injection site  Hives or difficulty breathing (go to the emergency room)  Inflammation or drainage at the injection site(s)  Severe back/neck pain greater than  usual  New symptoms which are concerning to you  Please note:  Although the local anesthetic injected can often make your back or neck feel good for several hours after the injection the pain will likely return.  It takes 3-5 days for steroids to work on the nerve root. You may not notice any pain relief for at least one week.  If effective, we will often do a series of 3 injections spaced 3-6 weeks apart to maximally decrease your pain.    If you have any questions, please call [phone] [facility]  What are the risk, side effects and possible complications? Generally speaking, most procedures are safe.  However, with any procedure there are risks, side effects, and the possibility of complications.  The risks and complications are dependent upon the sites that are lesioned, or the type of nerve block to be performed.  The closer the procedure is to the spine, the more serious the risks are.  Great care is taken when placing the radio frequency needles, block needles or lesioning probes, but sometimes complications can occur. 1. Infection: Any time there is an injection through the skin, there is a risk of infection.  This is why sterile conditions are used for these blocks.  There are four possible types of infection. 1. Localized skin infection. 2. Central Nervous System Infection-This can be in the form of Meningitis, which can be deadly. 3. Epidural Infections-This can be in the form of an epidural abscess, which can cause pressure inside of the spine, causing compression of the spinal cord with subsequent paralysis. This would require an emergency surgery to decompress, and there are no guarantees that the patient would recover from the paralysis. 4. Discitis-This is an infection of the intervertebral discs.  It occurs in about 1% of discography procedures.  It is difficult to treat and it may lead to surgery.         2. Pain: the needles have to go through skin and soft tissues, will cause soreness.       3. Damage to internal structures:  The nerves to be lesioned may be near blood vessels or    other nerves which can be potentially damaged.       4. Bleeding: Bleeding is more common if the patient is taking blood thinners such as  aspirin, Coumadin, Ticiid, Plavix, etc., or if he/she have some genetic predisposition  such as hemophilia. Bleeding into the spinal canal can cause compression of the spinal  cord with subsequent paralysis.  This would require an emergency surgery to  decompress and there are no guarantees that the patient would recover from the  paralysis.       5. Pneumothorax:  Puncturing of a lung is a possibility, every time a needle is introduced in  the area of the chest or upper back.  Pneumothorax refers to free air around the  collapsed lung(s), inside of the thoracic cavity (chest cavity).  Another two possible  complications related to a similar event would include: Hemothorax and Chylothorax.   These are variations of the Pneumothorax, where instead of air around the collapsed  lung(s), you may have blood or chyle, respectively.  6. Spinal headaches: They may occur with any procedures in the area of the spine.       7. Persistent CSF (Cerebro-Spinal Fluid) leakage: This is a rare problem, but may occur  with prolonged intrathecal or epidural catheters either due to the formation of a fistulous  track or a dural tear.       8. Nerve damage: By working so close to the spinal cord, there is always a possibility of  nerve damage, which could be as serious as a permanent spinal cord injury with  paralysis.       9. Death:  Although rare, severe deadly allergic reactions known as "Anaphylactic  reaction" can occur to any of the medications used.      10. Worsening of the symptoms:  We can always make thing worse.  What are the chances of something like this happening? Chances of any of this  occuring are extremely low.  By statistics, you have more of a chance of getting killed in a motor vehicle accident: while driving to the hospital than any of the above occurring .  Nevertheless, you should be aware that they are possibilities.  In general, it is similar to taking a shower.  Everybody knows that you can slip, hit your head and get killed.  Does that mean that you should not shower again?  Nevertheless always keep in mind that statistics do not mean anything if you happen to be on the wrong side of them.  Even if a procedure has a 1 (one) in a 1,000,000 (million) chance of going wrong, it you happen to be that one..Also, keep in mind that by statistics, you have more of a chance of having something go wrong when taking medications.  Who should not have this procedure? If you are on a blood thinning medication (e.g. Coumadin, Plavix, see list of "Blood Thinners"), or if you have an active infection going on, you should not have the procedure.  If you are taking any blood thinners, please inform your physician.  How should I prepare for this procedure?  Do not eat or drink anything at least six hours prior to the procedure.  Bring a driver with you .  It cannot be a taxi.  Come accompanied by an adult that can drive you back, and that is strong enough to help you if your legs get weak or numb from the local anesthetic.  Take all of your medicines the morning of the procedure with just enough water to swallow them.  If you have diabetes, make sure that you are scheduled to have your procedure done first thing in the morning, whenever possible.  If you have diabetes, take only half of your insulin dose and notify our nurse that you have done so as soon as you arrive at the clinic.  If you are diabetic, but only take blood sugar pills (oral hypoglycemic), then do not take them on the morning of your procedure.  You may take them after you have had the procedure.  Do not take aspirin  or any aspirin-containing medications, at least eleven (11) days prior to the procedure.  They may prolong bleeding.  Wear loose fitting clothing that may be easy to take off and that you would not mind if it got stained with Betadine or blood.  Do not wear any jewelry or perfume  Remove any nail coloring.  It will interfere with some of our monitoring equipment.  NOTE: Remember that this is  not meant to be interpreted as a complete list of all possible complications.  Unforeseen problems may occur.  BLOOD THINNERS The following drugs contain aspirin or other products, which can cause increased bleeding during surgery and should not be taken for 2 weeks prior to and 1 week after surgery.  If you should need take something for relief of minor pain, you may take acetaminophen which is found in Tylenol,m Datril, Anacin-3 and Panadol. It is not blood thinner. The products listed below are.  Do not take any of the products listed below in addition to any listed on your instruction sheet.  A.P.C or A.P.C with Codeine Codeine Phosphate Capsules #3 Ibuprofen Ridaura  ABC compound Congesprin Imuran rimadil  Advil Cope Indocin Robaxisal  Alka-Seltzer Effervescent Pain Reliever and Antacid Coricidin or Coricidin-D  Indomethacin Rufen  Alka-Seltzer plus Cold Medicine Cosprin Ketoprofen S-A-C Tablets  Anacin Analgesic Tablets or Capsules Coumadin Korlgesic Salflex  Anacin Extra Strength Analgesic tablets or capsules CP-2 Tablets Lanoril Salicylate  Anaprox Cuprimine Capsules Levenox Salocol  Anexsia-D Dalteparin Magan Salsalate  Anodynos Darvon compound Magnesium Salicylate Sine-off  Ansaid Dasin Capsules Magsal Sodium Salicylate  Anturane Depen Capsules Marnal Soma  APF Arthritis pain formula Dewitt's Pills Measurin Stanback  Argesic Dia-Gesic Meclofenamic Sulfinpyrazone  Arthritis Bayer Timed Release Aspirin Diclofenac Meclomen Sulindac  Arthritis pain formula Anacin Dicumarol Medipren Supac   Analgesic (Safety coated) Arthralgen Diffunasal Mefanamic Suprofen  Arthritis Strength Bufferin Dihydrocodeine Mepro Compound Suprol  Arthropan liquid Dopirydamole Methcarbomol with Aspirin Synalgos  ASA tablets/Enseals Disalcid Micrainin Tagament  Ascriptin Doan's Midol Talwin  Ascriptin A/D Dolene Mobidin Tanderil  Ascriptin Extra Strength Dolobid Moblgesic Ticlid  Ascriptin with Codeine Doloprin or Doloprin with Codeine Momentum Tolectin  Asperbuf Duoprin Mono-gesic Trendar  Aspergum Duradyne Motrin or Motrin IB Triminicin  Aspirin plain, buffered or enteric coated Durasal Myochrisine Trigesic  Aspirin Suppositories Easprin Nalfon Trillsate  Aspirin with Codeine Ecotrin Regular or Extra Strength Naprosyn Uracel  Atromid-S Efficin Naproxen Ursinus  Auranofin Capsules Elmiron Neocylate Vanquish  Axotal Emagrin Norgesic Verin  Azathioprine Empirin or Empirin with Codeine Normiflo Vitamin E  Azolid Emprazil Nuprin Voltaren  Bayer Aspirin plain, buffered or children's or timed BC Tablets or powders Encaprin Orgaran Warfarin Sodium  Buff-a-Comp Enoxaparin Orudis Zorpin  Buff-a-Comp with Codeine Equegesic Os-Cal-Gesic   Buffaprin Excedrin plain, buffered or Extra Strength Oxalid   Bufferin Arthritis Strength Feldene Oxphenbutazone   Bufferin plain or Extra Strength Feldene Capsules Oxycodone with Aspirin   Bufferin with Codeine Fenoprofen Fenoprofen Pabalate or Pabalate-SF   Buffets II Flogesic Panagesic   Buffinol plain or Extra Strength Florinal or Florinal with Codeine Panwarfarin   Buf-Tabs Flurbiprofen Penicillamine   Butalbital Compound Four-way cold tablets Penicillin   Butazolidin Fragmin Pepto-Bismol   Carbenicillin Geminisyn Percodan   Carna Arthritis Reliever Geopen Persantine   Carprofen Gold's salt Persistin   Chloramphenicol Goody's Phenylbutazone   Chloromycetin Haltrain Piroxlcam   Clmetidine heparin Plaquenil   Cllnoril Hyco-pap Ponstel   Clofibrate Hydroxy  chloroquine Propoxyphen         Before stopping any of these medications, be sure to consult the physician who ordered them.  Some, such as Coumadin (Warfarin) are ordered to prevent or treat serious conditions such as "deep thrombosis", "pumonary embolisms", and other heart problems.  The amount of time that you may need off of the medication may also vary with the medication and the reason for which you were taking it.  If you are taking any of these medications, please make sure  you notify your pain physician before you undergo any procedures.         Selective Nerve Root Block Patient Information  Description: Specific nerve roots exit the spinal canal and these nerves can be compressed and inflamed by a bulging disc and bone spurs.  By injecting steroids on the nerve root, we can potentially decrease the inflammation surrounding these nerves, which often leads to decreased pain.  Also, by injecting local anesthesia on the nerve root, this can provide [place] helpful information to give to your referring doctor if it decreases your pain.  Selective nerve root blocks can be done along the spine from the neck to the low back depending on the location of your pain.   After numbing the skin with local anesthesia, a small needle is passed to the nerve root and the position of the needle is verified using x-ray pictures.  After the needle is in correct position, we then deposit the medication.  You may experience a pressure sensation while this is being done.  The entire block usually lasts less than 15 minutes.  Conditions that may be treated with selective nerve root blocks:  Low back and leg pain  Spinal stenosis  Diagnostic block prior to potential surgery  Neck and arm pain  Post laminectomy syndrome  Preparation for the injection:  9. Do not eat any solid food or dairy products within 6 hours of your appointment. 10. You may drink clear liquids up to 2 hours before an appointment.   Clear liquids include water, black coffee, juice or soda.  No milk or cream please. 11. You may take your regular medications, including pain medications, with a sip of water before your appointment.  Diabetics should hold regular insulin (if taken separately) and take 1/2 normal NPH dose the morning of the procedure.  Carry some sugar containing items with you to your appointment. 12. A driver must accompany you and be prepared to drive you home after your procedure. 47. Bring all your current medications with you. 14. An IV may be inserted and sedation may be given at the discretion of the physician. 15. A blood pressure cuff, EKG, and other monitors will often be applied during the procedure.  Some patients may need to have extra oxygen administered for a short period. 36. You will be asked to provide medical information, including allergies, prior to the procedure.  We must know immediately if you are taking blood  Thinners (like Coumadin) or if you are allergic to IV iodine contrast (dye).  Possible side-effects: All are usually temporary  Bleeding from needle site  Light headedness  Numbness and tingling  Decreased blood pressure  Weakness in arms/legs  Pressure sensation in back/neck  Pain at injection site (several days)  Possible complications: All are extremely rare  Infection  Nerve injury  Spinal headache (a headache wore with upright position)  Call if you experience:  Fever/chills associated with headache or increased back/neck pain  Headache worsened by an upright position  New onset weakness or numbness of an extremity below the injection site  Hives or difficulty breathing (go to the emergency room)  Inflammation or drainage at the injection site(s)  Severe back/neck pain greater than usual  New symptoms which are concerning to you  Please note:  Although the local anesthetic injected can often make your back or neck feel good for several hours  after the injection the pain will likely return.  It takes 3-5 days for steroids to work  on the nerve root. You may not notice any pain relief for at least one week.  If effective, we will often do a series of 3 injections spaced 3-6 weeks apart to maximally decrease your pain.    If you have any questions, please call [phone] [facility] Clinic[facility]  What are the risk, side effects and possible complications? Generally speaking, most procedures are safe.  However, with any procedure there are risks, side effects, and the possibility of complications.  The risks and complications are dependent upon the sites that are lesioned, or the type of nerve block to be performed.  The closer the procedure is to the spine, the more serious the risks are.  Great care is taken when placing the radio frequency needles, block needles or lesioning probes, but sometimes complications can occur. 1. Infection: Any time there is an injection through the skin, there is a risk of infection.  This is why sterile conditions are used for these blocks.  There are four possible types of infection. 1. Localized skin infection. 2. Central Nervous System Infection-This can be in the form of Meningitis, which can be deadly. 3. Epidural Infections-This can be in the form of an epidural abscess, which can cause pressure inside of the spine, causing compression of the spinal cord with subsequent paralysis. This would require an emergency surgery to decompress, and there are no guarantees that the patient would recover from the paralysis. 4. Discitis-This is an infection of the intervertebral discs.  It occurs in about 1% of discography procedures.  It is difficult to treat and it may lead to surgery.        2. Pain: the needles have to go through skin and soft tissues, will cause soreness.       3. Damage to internal structures:  The nerves to be lesioned may be near blood  vessels or    other nerves which can be potentially damaged.       4. Bleeding: Bleeding is more common if the patient is taking blood thinners such as  aspirin, Coumadin, Ticiid, Plavix, etc., or if he/she have some genetic predisposition  such as hemophilia. Bleeding into the spinal canal can cause compression of the spinal  cord with subsequent paralysis.  This would require an emergency surgery to  decompress and there are no guarantees that the patient would recover from the  paralysis.       5. Pneumothorax:  Puncturing of a lung is a possibility, every time a needle is introduced in  the area of the chest or upper back.  Pneumothorax refers to free air around the  collapsed lung(s), inside of the thoracic cavity (chest cavity).  Another two possible  complications related to a similar event would include: Hemothorax and Chylothorax.   These are variations of the Pneumothorax, where instead of air around the collapsed  lung(s), you may have blood or chyle, respectively.       6. Spinal headaches: They may occur with any procedures in the area of the spine.       7. Persistent CSF (Cerebro-Spinal Fluid) leakage: This is a rare problem, but may occur  with prolonged intrathecal or epidural catheters either due to the formation of a fistulous  track or a dural tear.       8. Nerve damage: By working so close to the spinal cord, there is always a possibility of  nerve damage, which could be as serious as  a permanent spinal cord injury with  paralysis.       9. Death:  Although rare, severe deadly allergic reactions known as "Anaphylactic  reaction" can occur to any of the medications used.      10. Worsening of the symptoms:  We can always make thing worse.  What are the chances of something like this happening? Chances of any of this occuring are extremely low.  By statistics, you have more of a chance of getting killed in a motor vehicle accident: while driving to the hospital than any of the above  occurring .  Nevertheless, you should be aware that they are possibilities.  In general, it is similar to taking a shower.  Everybody knows that you can slip, hit your head and get killed.  Does that mean that you should not shower again?  Nevertheless always keep in mind that statistics do not mean anything if you happen to be on the wrong side of them.  Even if a procedure has a 1 (one) in a 1,000,000 (million) chance of going wrong, it you happen to be that one..Also, keep in mind that by statistics, you have more of a chance of having something go wrong when taking medications.  Who should not have this procedure? If you are on a blood thinning medication (e.g. Coumadin, Plavix, see list of "Blood Thinners"), or if you have an active infection going on, you should not have the procedure.  If you are taking any blood thinners, please inform your physician.  How should I prepare for this procedure?  Do not eat or drink anything at least six hours prior to the procedure.  Bring a driver with you .  It cannot be a taxi.  Come accompanied by an adult that can drive you back, and that is strong enough to help you if your legs get weak or numb from the local anesthetic.  Take all of your medicines the morning of the procedure with just enough water to swallow them.  If you have diabetes, make sure that you are scheduled to have your procedure done first thing in the morning, whenever possible.  If you have diabetes, take only half of your insulin dose and notify our nurse that you have done so as soon as you arrive at the clinic.  If you are diabetic, but only take blood sugar pills (oral hypoglycemic), then do not take them on the morning of your procedure.  You may take them after you have had the procedure.  Do not take aspirin or any aspirin-containing medications, at least eleven (11) days prior to the procedure.  They may prolong bleeding.  Wear loose fitting clothing that may be easy to  take off and that you would not mind if it got stained with Betadine or blood.  Do not wear any jewelry or perfume  Remove any nail coloring.  It will interfere with some of our monitoring equipment.  NOTE: Remember that this is not meant to be interpreted as a complete list of all possible complications.  Unforeseen problems may occur.  BLOOD THINNERS The following drugs contain aspirin or other products, which can cause increased bleeding during surgery and should not be taken for 2 weeks prior to and 1 week after surgery.  If you should need take something for relief of minor pain, you may take acetaminophen which is found in Tylenol,m Datril, Anacin-3 and Panadol. It is not blood thinner. The products listed below are.  Do  not take any of the products listed below in addition to any listed on your instruction sheet.  A.P.C or A.P.C with Codeine Codeine Phosphate Capsules #3 Ibuprofen Ridaura  ABC compound Congesprin Imuran rimadil  Advil Cope Indocin Robaxisal  Alka-Seltzer Effervescent Pain Reliever and Antacid Coricidin or Coricidin-D  Indomethacin Rufen  Alka-Seltzer plus Cold Medicine Cosprin Ketoprofen S-A-C Tablets  Anacin Analgesic Tablets or Capsules Coumadin Korlgesic Salflex  Anacin Extra Strength Analgesic tablets or capsules CP-2 Tablets Lanoril Salicylate  Anaprox Cuprimine Capsules Levenox Salocol  Anexsia-D Dalteparin Magan Salsalate  Anodynos Darvon compound Magnesium Salicylate Sine-off  Ansaid Dasin Capsules Magsal Sodium Salicylate  Anturane Depen Capsules Marnal Soma  APF Arthritis pain formula Dewitt's Pills Measurin Stanback  Argesic Dia-Gesic Meclofenamic Sulfinpyrazone  Arthritis Bayer Timed Release Aspirin Diclofenac Meclomen Sulindac  Arthritis pain formula Anacin Dicumarol Medipren Supac  Analgesic (Safety coated) Arthralgen Diffunasal Mefanamic Suprofen  Arthritis Strength Bufferin Dihydrocodeine Mepro Compound Suprol  Arthropan liquid Dopirydamole  Methcarbomol with Aspirin Synalgos  ASA tablets/Enseals Disalcid Micrainin Tagament  Ascriptin Doan's Midol Talwin  Ascriptin A/D Dolene Mobidin Tanderil  Ascriptin Extra Strength Dolobid Moblgesic Ticlid  Ascriptin with Codeine Doloprin or Doloprin with Codeine Momentum Tolectin  Asperbuf Duoprin Mono-gesic Trendar  Aspergum Duradyne Motrin or Motrin IB Triminicin  Aspirin plain, buffered or enteric coated Durasal Myochrisine Trigesic  Aspirin Suppositories Easprin Nalfon Trillsate  Aspirin with Codeine Ecotrin Regular or Extra Strength Naprosyn Uracel  Atromid-S Efficin Naproxen Ursinus  Auranofin Capsules Elmiron Neocylate Vanquish  Axotal Emagrin Norgesic Verin  Azathioprine Empirin or Empirin with Codeine Normiflo Vitamin E  Azolid Emprazil Nuprin Voltaren  Bayer Aspirin plain, buffered or children's or timed BC Tablets or powders Encaprin Orgaran Warfarin Sodium  Buff-a-Comp Enoxaparin Orudis Zorpin  Buff-a-Comp with Codeine Equegesic Os-Cal-Gesic   Buffaprin Excedrin plain, buffered or Extra Strength Oxalid   Bufferin Arthritis Strength Feldene Oxphenbutazone   Bufferin plain or Extra Strength Feldene Capsules Oxycodone with Aspirin   Bufferin with Codeine Fenoprofen Fenoprofen Pabalate or Pabalate-SF   Buffets II Flogesic Panagesic   Buffinol plain or Extra Strength Florinal or Florinal with Codeine Panwarfarin   Buf-Tabs Flurbiprofen Penicillamine   Butalbital Compound Four-way cold tablets Penicillin   Butazolidin Fragmin Pepto-Bismol   Carbenicillin Geminisyn Percodan   Carna Arthritis Reliever Geopen Persantine   Carprofen Gold's salt Persistin   Chloramphenicol Goody's Phenylbutazone   Chloromycetin Haltrain Piroxlcam   Clmetidine heparin Plaquenil   Cllnoril Hyco-pap Ponstel   Clofibrate Hydroxy chloroquine Propoxyphen         Before stopping any of these medications, be sure to consult the physician who ordered them.  Some, such as Coumadin (Warfarin) are ordered  to prevent or treat serious conditions such as "deep thrombosis", "pumonary embolisms", and other heart problems.  The amount of time that you may need off of the medication may also vary with the medication and the reason for which you were taking it.  If you are taking any of these medications, please make sure you notify your pain physician before you undergo any procedures.

## 2014-12-25 NOTE — Telephone Encounter (Signed)
Thanks. We will need longer to figure out why she is depressed and if medication and/or counseling warranted.

## 2014-12-25 NOTE — Progress Notes (Signed)
Safety precautions to be maintained throughout the outpatient stay will include: orient to surroundings, keep bed in low position, maintain call bell within reach at all times, provide assistance with transfer out of bed and ambulation.  

## 2014-12-25 NOTE — Telephone Encounter (Signed)
Requesting referral for depression and a sign off for Dr. Primus Bravo to prescribe her meds.

## 2014-12-25 NOTE — Telephone Encounter (Signed)
I scheduled her for tomorrow morning, at 7am.  I didn't see until after I scheduled that it was only a 15 min slot, sorry. :(

## 2014-12-25 NOTE — Telephone Encounter (Signed)
No. This needs to be rescheduled. There is no way we can address depression in a 27min 7am slot

## 2014-12-25 NOTE — Telephone Encounter (Signed)
Patient has requested to have her gabapentin Rx rewritten, due to the pharmacy misunderstanding the dose of the medication. The pharmacy had one capsule three times a day, patient stated that the medication should be two capsules twice a day. Patient stated that she's currently running out of medication. Patient also needed to get a sign off, so that Dr Crisp(pain clinic) can prescribe this medication. Patient also needed a referral to see someone for depression.

## 2014-12-25 NOTE — Progress Notes (Signed)
   Subjective:    Patient ID: Hannah [name], female    DOB: 1951/02/21, 64 y.o.   MRN: [id number]  HPI Patient is 64 year old female returns to [place] for further evaluation and treatment of pain involving the region of the lower back and lower extremity region. Patient states that the pain is aggravated by standing walking twisting turning maneuvers. Patient states the pain becomes more intense as the day progresses. Patient's Ms. to occasional numbness and tingling of the lower extremities. We discussed patient's condition and will proceed with interventional treatment at time return appointment consisting of lumbosacral selective nerve root block. The patient was understanding and in agreement with suggested treatment plan. Patient also expressed elements of depression and we will confirm psych evaluation as discussed with patient on today's visit the patient was understanding and in agreement with suggested treatment plan. Patient denied any feelings of wanting to hurt herself. The patient stated that she we will follow-up psych evaluation and that she would also like to consider surgical evaluation of lower back and lower extremity pain   Review of Systems     Objective:   Physical Exam  There was tenderness to palpation of the splenius capitis and occipitalis musculature region of mild degree. There was mild tenderness of the acromioclavicular and glenohumeral joint regions. Patient appeared to be with slightly decreased grip strength with Tinel and Phalen's maneuver without increased pain of significant degree. There was tends to palpation over the region of the thoracic facet thoracic paraspinal musculature regions without crepitus of the thoracic region noted. Over the region of the lumbar paraspinal muscular region lumbar facet region was with moderate to moderately severe tenderness to palpation. Extension and palpation of the lumbar facets reproduce moderately severe  discomfort. Lateral bending and rotation and extension and palpation of the lumbar facets was associated with severe discomfort. Straight leg raising was tolerates approximately 20 with questionable increased pain with dorsiflexion noted. There was negative clonus negative Homans. DTRs were difficult to elicit patient had difficulty relaxing. There was negative clonus negative Homans abdomen was nontender with no costovertebral angle tenderness noted. palpation      Assessment & Plan:    Degenerative disc disease lumbar spine Kyphotic curvature at the 1112 disc space, spondylosis and shallow disc bulge at this level resulting in mild canal and moderate bilateral foraminal stenosis and mild anterior and superior T12 upper endplate depression, lack of marrow edema indicative of chronic change with mild osseous and disc changes at the remaining levels with adrenal nodule. Disc desiccation central right with the central disc bulge resulting and right foraminal narrowing L3-4 level with moderate left and right facet arthrosis. L4-L5 disc desiccation. Shallow broad-based disc bulging and narrowing of the central canal and left foramen at L4-L5 level    Lumbar facet syndrome  Lumbar stenosis with neurogenic claudication   Sacroiliac joint dysfunction  Meralgia paresthetica    PLAN   Continue present medication hydrocodone acetaminophen  Lumbosacral selective nerve root block to be performed at time of return appointment  F/U PCP for evaliation of  BP and general medical  condition  F/U surgical evaluation. May consider pending follow-up evaluations  Psych evaluation as scheduled  F/U neurological evaluation. May consider pending follow-up evaluations  May consider radiofrequency rhizolysis or intraspinal procedures pending response to present treatment and F/U evaluation   Patient to call [facility] should patient have concerns prior to scheduled return appointment.

## 2014-12-25 NOTE — Telephone Encounter (Signed)
Having Hannah [name] assist with rescheduling this.

## 2014-12-26 ENCOUNTER — Ambulatory Visit: Payer: 59 | Admitting: Internal Medicine

## 2014-12-26 NOTE — Telephone Encounter (Signed)
Error

## 2014-12-27 NOTE — Telephone Encounter (Signed)
She needs to be seen in followup. Did Dr. Primus Bravo change dosing?

## 2014-12-27 NOTE — Telephone Encounter (Signed)
If you look at the prior note she had said a different dosing, can you clarify what one now as they are both different.

## 2014-12-27 NOTE — Telephone Encounter (Signed)
Can you please help me clarify what the dosing should be for this patient, she has given conflicting dosages in the past two days.  Thanks

## 2014-12-27 NOTE — Telephone Encounter (Signed)
Done! Pt is scheduled.

## 2014-12-27 NOTE — Telephone Encounter (Signed)
Pt called back about the gabapentin (NEURONTIN) 300 MG capsule instructions that are incorrect. Pt states it should be 2 capsules three times a day. Thank You!

## 2014-12-27 NOTE — Telephone Encounter (Signed)
Can you call her to schedule a visit? Thanks Lavella Lemons

## 2014-12-31 ENCOUNTER — Ambulatory Visit: Payer: 59 | Admitting: Internal Medicine

## 2015-01-07 ENCOUNTER — Ambulatory Visit: Payer: 59 | Admitting: Internal Medicine

## 2015-01-10 ENCOUNTER — Other Ambulatory Visit: Payer: Self-pay | Admitting: Pain Medicine

## 2015-01-16 ENCOUNTER — Ambulatory Visit: Payer: 59 | Attending: Pain Medicine | Admitting: Pain Medicine

## 2015-01-16 ENCOUNTER — Encounter: Payer: Self-pay | Admitting: Pain Medicine

## 2015-01-16 VITALS — BP 131/60 | HR 62 | Temp 98.0°F | Resp 16 | Ht 60.0 in | Wt 150.0 lb

## 2015-01-16 DIAGNOSIS — M4804 Spinal stenosis, thoracic region: Secondary | ICD-10-CM | POA: Insufficient documentation

## 2015-01-16 DIAGNOSIS — M545 Low back pain: Secondary | ICD-10-CM | POA: Insufficient documentation

## 2015-01-16 DIAGNOSIS — M47816 Spondylosis without myelopathy or radiculopathy, lumbar region: Secondary | ICD-10-CM

## 2015-01-16 DIAGNOSIS — M47894 Other spondylosis, thoracic region: Secondary | ICD-10-CM | POA: Insufficient documentation

## 2015-01-16 DIAGNOSIS — M79604 Pain in right leg: Secondary | ICD-10-CM

## 2015-01-16 DIAGNOSIS — G5790 Unspecified mononeuropathy of unspecified lower limb: Secondary | ICD-10-CM

## 2015-01-16 DIAGNOSIS — M533 Sacrococcygeal disorders, not elsewhere classified: Secondary | ICD-10-CM

## 2015-01-16 DIAGNOSIS — M79659 Pain in unspecified thigh: Secondary | ICD-10-CM

## 2015-01-16 DIAGNOSIS — M51369 Other intervertebral disc degeneration, lumbar region without mention of lumbar back pain or lower extremity pain: Secondary | ICD-10-CM

## 2015-01-16 DIAGNOSIS — M79606 Pain in leg, unspecified: Secondary | ICD-10-CM | POA: Insufficient documentation

## 2015-01-16 DIAGNOSIS — M5136 Other intervertebral disc degeneration, lumbar region: Secondary | ICD-10-CM | POA: Diagnosis not present

## 2015-01-16 DIAGNOSIS — M48062 Spinal stenosis, lumbar region with neurogenic claudication: Secondary | ICD-10-CM

## 2015-01-16 MED ORDER — TRIAMCINOLONE ACETONIDE 40 MG/ML IJ SUSP
40.0000 mg | Freq: Once | INTRAMUSCULAR | Status: AC
  Administered 2015-01-16: 40 mg

## 2015-01-16 MED ORDER — CIPROFLOXACIN IN D5W 400 MG/200ML IV SOLN
400.0000 mg | Freq: Once | INTRAVENOUS | Status: AC
  Administered 2015-01-16: 11:00:00 via INTRAVENOUS

## 2015-01-16 MED ORDER — ORPHENADRINE CITRATE 30 MG/ML IJ SOLN
60.0000 mg | Freq: Once | INTRAMUSCULAR | Status: AC
  Administered 2015-01-16: 60 mg via INTRAMUSCULAR

## 2015-01-16 MED ORDER — LACTATED RINGERS IV SOLN: 1000.0000 mL | INTRAVENOUS | Status: DC

## 2015-01-16 MED ORDER — FENTANYL CITRATE (PF) 100 MCG/2ML IJ SOLN
100.0000 ug | Freq: Once | INTRAMUSCULAR | Status: AC
  Administered 2015-01-16: 50 ug via INTRAVENOUS

## 2015-01-16 MED ORDER — FENTANYL CITRATE (PF) 100 MCG/2ML IJ SOLN
INTRAMUSCULAR | Status: AC
  Administered 2015-01-16: 50 ug via INTRAVENOUS
  Filled 2015-01-16: qty 2

## 2015-01-16 MED ORDER — ORPHENADRINE CITRATE 30 MG/ML IJ SOLN
INTRAMUSCULAR | Status: AC
  Administered 2015-01-16: 60 mg via INTRAMUSCULAR
  Filled 2015-01-16: qty 2

## 2015-01-16 MED ORDER — BUPIVACAINE HCL (PF) 0.25 % IJ SOLN
30.0000 mL | Freq: Once | INTRAMUSCULAR | Status: AC
  Administered 2015-01-16: 11:00:00

## 2015-01-16 MED ORDER — TRIAMCINOLONE ACETONIDE 40 MG/ML IJ SUSP
INTRAMUSCULAR | Status: AC
  Administered 2015-01-16: 40 mg
  Filled 2015-01-16: qty 1

## 2015-01-16 MED ORDER — MIDAZOLAM HCL 5 MG/5ML IJ SOLN
INTRAMUSCULAR | Status: AC
  Administered 2015-01-16: 3 mg via INTRAVENOUS
  Filled 2015-01-16: qty 5

## 2015-01-16 MED ORDER — MIDAZOLAM HCL 5 MG/5ML IJ SOLN
5.0000 mg | Freq: Once | INTRAMUSCULAR | Status: AC
  Administered 2015-01-16: 3 mg via INTRAVENOUS

## 2015-01-16 MED ORDER — CIPROFLOXACIN HCL 250 MG PO TABS: 250.0000 mg | ORAL_TABLET | Freq: Two times a day (BID) | ORAL | Status: DC

## 2015-01-16 MED ORDER — HYDROCODONE-ACETAMINOPHEN 5-325 MG PO TABS: ORAL_TABLET | ORAL | Status: DC

## 2015-01-16 MED ORDER — BUPIVACAINE HCL (PF) 0.25 % IJ SOLN
INTRAMUSCULAR | Status: AC
  Filled 2015-01-16: qty 30

## 2015-01-16 MED ORDER — LIDOCAINE HCL (PF) 1 % IJ SOLN: 10.0000 mL | Freq: Once | INTRAMUSCULAR | Status: DC

## 2015-01-16 MED ORDER — CIPROFLOXACIN IN D5W 400 MG/200ML IV SOLN
INTRAVENOUS | Status: AC
  Filled 2015-01-16: qty 200

## 2015-01-16 NOTE — Progress Notes (Signed)
Subjective:    Patient ID: Hannah [name], female    DOB: Aug 21, 1950, 64 y.o.   MRN: [id number]  HPI PROCEDURE PERFORMED: Lumbosacral selective nerve root block   NOTE: The patient is a 64 y.o. female who returns to [place] for further evaluation and treatment of pain involving the lumbar and lower extremity region. Studies consisting of MRI has revealed the patient to be with evidence of Degenerative disc disease lumbar spine with  Kyphotic curvature at the 1112 disc space, spondylosis and shallow disc bulge at this level resulting in mild canal and moderate bilateral foraminal stenosis and mild anterior and superior T12 upper endplate depression, lack of marrow edema indicative of chronic change with mild osseous and disc changes at the remaining levels with adrenal nodule. Disc desiccation central right with the central disc bulge resulting and right foraminal narrowing L3-4 level with moderate left and right facet arthrosis. L4-L5 disc desiccation. Shallow broad-based disc bulging and narrowing of the central canal and left foramen at L4-L5 level. There is concern regarding radicular component of pain contributing to patient's symptomatology with concern regarding intraspinal abnormalities contributing to the patient's symptomatology. The risks, benefits, and expectations of the procedure have been explained to the patient who was understanding and in agreement with suggested treatment plan. We will proceed with interventional treatment as discussed and as explained to the patient. The patient is understanding and in agreement with suggested treatment plan.   DESCRIPTION OF PROCEDURE: Lumbosacral selective nerve root block with IV Versed, IV fentanyl conscious sedation, EKG, blood pressure, pulse, and pulse oximetry monitoring. The procedure was performed with the patient in the prone position under fluoroscopic guidance. With the patient in the prone position, Betadine prep of  proposed entry site was performed. Local anesthetic skin wheal of proposed needle entry site was prepared with 1.5% plain lidocaine with AP view of the lumbosacral spine.   PROCEDURE #1: Needle placement at the right L 2 vertebral body: A 22 -gauge needle was inserted at the inferior border of the transverse process of the vertebral body with needle placed medial to the midline of the transverse process on AP view of the lumbosacral spine.   NEEDLE PLACEMENT AT  L3, L4, and L5  VERTEBRAL BODY LEVELS  Needle  placement was accomplished at L3, L4, and L5  vertebral body levels on the right side exactly as was accomplished at the L2  vertebral body level  and utilizing the same technique and under fluoroscopic guidance.    Needle placement was then verified on lateral view at all levels with needle tip documented to be in the posterior superior quadrant of the intervertebral foramen of  L 2, L3, L4, and L5. Following negative aspiration for heme and CSF at each level, each level was injected with 3 mL of 0.25% bupivacaine with Kenalog.   Myoneural block injections of the gluteal musculature region Following Betadine prep of proposed entry site a 22-gauge needle was inserted in the gluteal musculature region and following negative aspiration for cc of 0.25% bupivacaine with Norflex was injected for myoneural block injections of the gluteal musculature region 2    The patient tolerated the procedure well. A total of 10 mg of Kenalog was utilized for the procedure.   PLAN:  1. Medications: Will continue presently prescribed medications. Hydrocodone acetaminophen 2. The patient is to undergo follow-up evaluation with PCP Dr. Ronette Deter for evaluation of blood pressure and general medical condition status post procedure performed on today's visit.  3. Surgical follow-up evaluation. Surgical evaluation is been addressed and may consider reevaluation as discussed 4. Neurological evaluation. May  consider PN[facility] EMG studies and other studies pending follow-up evaluation 5. May consider radiofrequency procedures, implantation type procedures and other treatment pending response to treatment and follow-up evaluation. 6. The patient has been advise do adhere to proper body mechanics and avoid activities which may aggravate condition. 7. The patient has been advised to call the [facility] prior to scheduled return appointment should there be significant change in the patient's condition or should the patient have other concerns regarding condition prior to scheduled return appointment.    Review of Systems     Objective:   Physical Exam        Assessment & Plan:

## 2015-01-16 NOTE — Patient Instructions (Addendum)
PLAN   Continue present medication hydrocodone acetaminophen and begin taking antibiotics Cipro today as prescribed  F/U PCP Dr. Ronette [name] for evaliation of  BP and general medical  condition and discuss psych evaluation with Dr. Gilford [name] as well  F/U surgical evaluation. May consider pending follow-up evaluations  F/U neurological evaluation. May consider pending follow-up evaluations  Psych evaluation as planned  May consider radiofrequency rhizolysis or intraspinal procedures pending response to present treatment and F/U evaluation Selective Nerve Root Block Patient Information  Description: Specific nerve roots exit the spinal canal and these nerves can be compressed and inflamed by a bulging disc and bone spurs.  By injecting steroids on the nerve root, we can potentially decrease the inflammation surrounding these nerves, which often leads to decreased pain.  Also, by injecting local anesthesia on the nerve root, this can provide [place] helpful information to give to your referring doctor if it decreases your pain.  Selective nerve root blocks can be done along the spine from the neck to the low back depending on the location of your pain.   After numbing the skin with local anesthesia, a small needle is passed to the nerve root and the position of the needle is verified using x-ray pictures.  After the needle is in correct position, we then deposit the medication.  You may experience a pressure sensation while this is being done.  The entire block usually lasts less than 15 minutes.  Conditions that may be treated with selective nerve root blocks:  Low back and leg pain  Spinal stenosis  Diagnostic block prior to potential surgery  Neck and arm pain  Post laminectomy syndrome  Preparation for the injection:  1. Do not eat any solid food or dairy products within 6 hours of your appointment. 2. You may drink clear liquids up to 2 hours before an appointment.  Clear liquids  include water, black coffee, juice or soda.  No milk or cream please. 3. You may take your regular medications, including pain medications, with a sip of water before your appointment.  Diabetics should hold regular insulin (if taken separately) and take 1/2 normal NPH dose the morning of the procedure.  Carry some sugar containing items with you to your appointment. 4. A driver must accompany you and be prepared to drive you home after your procedure. 5. Bring all your current medications with you. 6. An IV may be inserted and sedation may be given at the discretion of the physician. 7. A blood pressure cuff, EKG, and other monitors will often be applied during the procedure.  Some patients may need to have extra oxygen administered for a short period. 8. You will be asked to provide medical information, including allergies, prior to the procedure.  We must know immediately if you are taking blood  Thinners (like Coumadin) or if you are allergic to IV iodine contrast (dye).  Possible side-effects: All are usually temporary  Bleeding from needle site  Light headedness  Numbness and tingling  Decreased blood pressure  Weakness in arms/legs  Pressure sensation in back/neck  Pain at injection site (several days)  Possible complications: All are extremely rare  Infection  Nerve injury  Spinal headache (a headache wore with upright position)  Call if you experience:  Fever/chills associated with headache or increased back/neck pain  Headache worsened by an upright position  New onset weakness or numbness of an extremity below the injection site  Hives or difficulty breathing (go to the emergency room)  Inflammation or drainage at the injection site(s)  Severe back/neck pain greater than usual  New symptoms which are concerning to you  Please note:  Although the local anesthetic injected can often make your back or neck feel good for several hours after the injection  the pain will likely return.  It takes 3-5 days for steroids to work on the nerve root. You may not notice any pain relief for at least one week.  If effective, we will often do a series of 3 injections spaced 3-6 weeks apart to maximally decrease your pain.    If you have any questions, please call [phone] [facility] Discharge Inst[facility]  General Discharge Instructions :  If you need to reach your doctor call: Monday-Friday 8:00 am - 4:00 pm at [phone] or toll free [phone].  After clinic hours [phone] to have operator reach doctor.  Bring all of your medication bottles to all your appointments in the pain clinic.  To cancel or reschedule your appointment with Pain Management please remember to call 24 hours in advance to avoid a fee.  Refer to the educational materials which you have been given on: General Risks, I had my Procedure. Discharge Instructions, Post Sedation.  Post Procedure Instructions:  The drugs you were given will stay in your system until tomorrow, so for the next 24 hours you should not drive, make any legal decisions or drink any alcoholic beverages.  You may eat anything you prefer, but it is better to start with liquids then soups and crackers, and gradually work up to solid foods.  Please notify your doctor immediately if you have any unusual bleeding, trouble breathing or pain that is not related to your normal pain.  Depending on the type of procedure that was done, some parts of your body may feel week and/or numb.  This usually clears up by tonight or the next day.  Walk with the use of an assistive device or accompanied by an adult for the 24 hours.  You may use ice on the affected area for the first 24 hours.  Put ice in a Ziploc bag and cover with a towel and place against area 15 minutes on 15 minutes off.  You may switch to heat after 24 hours.GENERAL RISKS AND COMPLICATIONS  What  are the risk, side effects and possible complications? Generally speaking, most procedures are safe.  However, with any procedure there are risks, side effects, and the possibility of complications.  The risks and complications are dependent upon the sites that are lesioned, or the type of nerve block to be performed.  The closer the procedure is to the spine, the more serious the risks are.  Great care is taken when placing the radio frequency needles, block needles or lesioning probes, but sometimes complications can occur. 1. Infection: Any time there is an injection through the skin, there is a risk of infection.  This is why sterile conditions are used for these blocks.  There are four possible types of infection. 1. Localized skin infection. 2. Central Nervous System Infection-This can be in the form of Meningitis, which can be deadly. 3. Epidural Infections-This can be in the form of an epidural abscess, which can cause pressure inside of the spine, causing compression of the spinal cord with subsequent paralysis. This would require an emergency surgery to decompress, and there are no guarantees that the patient would recover from the paralysis. 4. Discitis-This is an infection of the intervertebral  discs.  It occurs in about 1% of discography procedures.  It is difficult to treat and it may lead to surgery.        2. Pain: the needles have to go through skin and soft tissues, will cause soreness.       3. Damage to internal structures:  The nerves to be lesioned may be near blood vessels or    other nerves which can be potentially damaged.       4. Bleeding: Bleeding is more common if the patient is taking blood thinners such as  aspirin, Coumadin, Ticiid, Plavix, etc., or if he/she have some genetic predisposition  such as hemophilia. Bleeding into the spinal canal can cause compression of the spinal  cord with subsequent paralysis.  This would require an emergency surgery to  decompress and  there are no guarantees that the patient would recover from the  paralysis.       5. Pneumothorax:  Puncturing of a lung is a possibility, every time a needle is introduced in  the area of the chest or upper back.  Pneumothorax refers to free air around the  collapsed lung(s), inside of the thoracic cavity (chest cavity).  Another two possible  complications related to a similar event would include: Hemothorax and Chylothorax.   These are variations of the Pneumothorax, where instead of air around the collapsed  lung(s), you may have blood or chyle, respectively.       6. Spinal headaches: They may occur with any procedures in the area of the spine.       7. Persistent CSF (Cerebro-Spinal Fluid) leakage: This is a rare problem, but may occur  with prolonged intrathecal or epidural catheters either due to the formation of a fistulous  track or a dural tear.       8. Nerve damage: By working so close to the spinal cord, there is always a possibility of  nerve damage, which could be as serious as a permanent spinal cord injury with  paralysis.       9. Death:  Although rare, severe deadly allergic reactions known as "Anaphylactic  reaction" can occur to any of the medications used.      10. Worsening of the symptoms:  We can always make thing worse.  What are the chances of something like this happening? Chances of any of this occuring are extremely low.  By statistics, you have more of a chance of getting killed in a motor vehicle accident: while driving to the hospital than any of the above occurring .  Nevertheless, you should be aware that they are possibilities.  In general, it is similar to taking a shower.  Everybody knows that you can slip, hit your head and get killed.  Does that mean that you should not shower again?  Nevertheless always keep in mind that statistics do not mean anything if you happen to be on the wrong side of them.  Even if a procedure has a 1 (one) in a 1,000,000 (million) chance  of going wrong, it you happen to be that one..Also, keep in mind that by statistics, you have more of a chance of having something go wrong when taking medications.  Who should not have this procedure? If you are on a blood thinning medication (e.g. Coumadin, Plavix, see list of "Blood Thinners"), or if you have an active infection going on, you should not have the procedure.  If you are taking any blood thinners, please inform  your physician.  How should I prepare for this procedure?  Do not eat or drink anything at least six hours prior to the procedure.  Bring a driver with you .  It cannot be a taxi.  Come accompanied by an adult that can drive you back, and that is strong enough to help you if your legs get weak or numb from the local anesthetic.  Take all of your medicines the morning of the procedure with just enough water to swallow them.  If you have diabetes, make sure that you are scheduled to have your procedure done first thing in the morning, whenever possible.  If you have diabetes, take only half of your insulin dose and notify our nurse that you have done so as soon as you arrive at the clinic.  If you are diabetic, but only take blood sugar pills (oral hypoglycemic), then do not take them on the morning of your procedure.  You may take them after you have had the procedure.  Do not take aspirin or any aspirin-containing medications, at least eleven (11) days prior to the procedure.  They may prolong bleeding.  Wear loose fitting clothing that may be easy to take off and that you would not mind if it got stained with Betadine or blood.  Do not wear any jewelry or perfume  Remove any nail coloring.  It will interfere with some of our monitoring equipment.  NOTE: Remember that this is not meant to be interpreted as a complete list of all possible complications.  Unforeseen problems may occur.  BLOOD THINNERS The following drugs contain aspirin or other products, which  can cause increased bleeding during surgery and should not be taken for 2 weeks prior to and 1 week after surgery.  If you should need take something for relief of minor pain, you may take acetaminophen which is found in Tylenol,m Datril, Anacin-3 and Panadol. It is not blood thinner. The products listed below are.  Do not take any of the products listed below in addition to any listed on your instruction sheet.  A.P.C or A.P.C with Codeine Codeine Phosphate Capsules #3 Ibuprofen Ridaura  ABC compound Congesprin Imuran rimadil  Advil Cope Indocin Robaxisal  Alka-Seltzer Effervescent Pain Reliever and Antacid Coricidin or Coricidin-D  Indomethacin Rufen  Alka-Seltzer plus Cold Medicine Cosprin Ketoprofen S-A-C Tablets  Anacin Analgesic Tablets or Capsules Coumadin Korlgesic Salflex  Anacin Extra Strength Analgesic tablets or capsules CP-2 Tablets Lanoril Salicylate  Anaprox Cuprimine Capsules Levenox Salocol  Anexsia-D Dalteparin Magan Salsalate  Anodynos Darvon compound Magnesium Salicylate Sine-off  Ansaid Dasin Capsules Magsal Sodium Salicylate  Anturane Depen Capsules Marnal Soma  APF Arthritis pain formula Dewitt's Pills Measurin Stanback  Argesic Dia-Gesic Meclofenamic Sulfinpyrazone  Arthritis Bayer Timed Release Aspirin Diclofenac Meclomen Sulindac  Arthritis pain formula Anacin Dicumarol Medipren Supac  Analgesic (Safety coated) Arthralgen Diffunasal Mefanamic Suprofen  Arthritis Strength Bufferin Dihydrocodeine Mepro Compound Suprol  Arthropan liquid Dopirydamole Methcarbomol with Aspirin Synalgos  ASA tablets/Enseals Disalcid Micrainin Tagament  Ascriptin Doan's Midol Talwin  Ascriptin A/D Dolene Mobidin Tanderil  Ascriptin Extra Strength Dolobid Moblgesic Ticlid  Ascriptin with Codeine Doloprin or Doloprin with Codeine Momentum Tolectin  Asperbuf Duoprin Mono-gesic Trendar  Aspergum Duradyne Motrin or Motrin IB Triminicin  Aspirin plain, buffered or enteric coated Durasal  Myochrisine Trigesic  Aspirin Suppositories Easprin Nalfon Trillsate  Aspirin with Codeine Ecotrin Regular or Extra Strength Naprosyn Uracel  Atromid-S Efficin Naproxen Ursinus  Auranofin Capsules Elmiron Neocylate Vanquish  Axotal Emagrin Norgesic  Verin  Azathioprine Empirin or Empirin with Codeine Normiflo Vitamin E  Azolid Emprazil Nuprin Voltaren  Bayer Aspirin plain, buffered or children's or timed BC Tablets or powders Encaprin Orgaran Warfarin Sodium  Buff-a-Comp Enoxaparin Orudis Zorpin  Buff-a-Comp with Codeine Equegesic Os-Cal-Gesic   Buffaprin Excedrin plain, buffered or Extra Strength Oxalid   Bufferin Arthritis Strength Feldene Oxphenbutazone   Bufferin plain or Extra Strength Feldene Capsules Oxycodone with Aspirin   Bufferin with Codeine Fenoprofen Fenoprofen Pabalate or Pabalate-SF   Buffets II Flogesic Panagesic   Buffinol plain or Extra Strength Florinal or Florinal with Codeine Panwarfarin   Buf-Tabs Flurbiprofen Penicillamine   Butalbital Compound Four-way cold tablets Penicillin   Butazolidin Fragmin Pepto-Bismol   Carbenicillin Geminisyn Percodan   Carna Arthritis Reliever Geopen Persantine   Carprofen Gold's salt Persistin   Chloramphenicol Goody's Phenylbutazone   Chloromycetin Haltrain Piroxlcam   Clmetidine heparin Plaquenil   Cllnoril Hyco-pap Ponstel   Clofibrate Hydroxy chloroquine Propoxyphen         Before stopping any of these medications, be sure to consult the physician who ordered them.  Some, such as Coumadin (Warfarin) are ordered to prevent or treat serious conditions such as "deep thrombosis", "pumonary embolisms", and other heart problems.  The amount of time that you may need off of the medication may also vary with the medication and the reason for which you were taking it.  If you are taking any of these medications, please make sure you notify your pain physician before you undergo any procedures.

## 2015-01-17 ENCOUNTER — Telehealth: Payer: Self-pay | Admitting: *Deleted

## 2015-01-17 NOTE — Telephone Encounter (Signed)
Spoke with patient, verbalizes no problems or concerns.

## 2015-01-25 ENCOUNTER — Other Ambulatory Visit: Payer: Self-pay | Admitting: Nurse Practitioner

## 2015-01-25 ENCOUNTER — Telehealth: Payer: Self-pay | Admitting: Internal Medicine

## 2015-01-25 DIAGNOSIS — E041 Nontoxic single thyroid nodule: Secondary | ICD-10-CM

## 2015-01-25 NOTE — Telephone Encounter (Signed)
Appt received from [facility]. Not referral in system. Advised by [facility] that the appt is about thyroid nodule. Please advise.msn

## 2015-01-25 NOTE — Telephone Encounter (Signed)
[facility] referral placed in Dr. Thomes Dinning absence.

## 2015-01-28 ENCOUNTER — Encounter: Payer: Self-pay | Admitting: Internal Medicine

## 2015-01-28 ENCOUNTER — Ambulatory Visit (INDEPENDENT_AMBULATORY_CARE_PROVIDER_SITE_OTHER): Payer: 59 | Admitting: Internal Medicine

## 2015-01-28 VITALS — BP 103/70 | HR 75 | Temp 98.7°F | Ht 60.0 in | Wt 148.2 lb

## 2015-01-28 DIAGNOSIS — F329 Major depressive disorder, single episode, unspecified: Secondary | ICD-10-CM | POA: Diagnosis not present

## 2015-01-28 DIAGNOSIS — F32A Depression, unspecified: Secondary | ICD-10-CM | POA: Insufficient documentation

## 2015-01-28 DIAGNOSIS — R197 Diarrhea, unspecified: Secondary | ICD-10-CM | POA: Diagnosis not present

## 2015-01-28 MED ORDER — PAROXETINE HCL 10 MG PO TABS: 10.0000 mg | ORAL_TABLET | Freq: Every day | ORAL | Status: DC

## 2015-01-28 MED ORDER — HYOSCYAMINE SULFATE 0.125 MG SL SUBL: 0.1250 mg | SUBLINGUAL_TABLET | SUBLINGUAL | Status: DC | PRN

## 2015-01-28 NOTE — Progress Notes (Signed)
Subjective:    Patient ID: Hannah [name], female    DOB: 21-Nov-1950, 64 y.o.   MRN: [id number]  HPI  64YO female presents for acute visit.  Depression - Living with nephew and his girlfriend, and her son and girlfriend. Feels isolated, depressed.  Looking into other living situations.  Abdominal pain - Seen by Dr. Lacinda Axon for this in September. CDiff was negative. Labs showed dehydration. Having diffuse abdominal cramping "every day" since that time. Feels nauseous, but generally does not vomiting. Having watery diarrhea several times per week, sometimes alternates with constipation. Last colonoscopy 5 years ago.  Wt Readings from Last 3 Encounters:  01/28/15 148 lb 4 oz (67.246 kg)  01/16/15 150 lb (68.04 kg)  12/25/14 152 lb (68.947 kg)   BP Readings from Last 3 Encounters:  01/28/15 103/70  01/16/15 131/60  12/25/14 114/47    Past Medical History  Diagnosis Date  . GERD (gastroesophageal reflux disease)   . Degenerative joint disease of spine   . Hypertension   . Hyperlipidemia   . Depression     Followed in past by Dr. Randel Books  . Hypercholesteremia    Family History  Problem Relation Age of Onset  . Hypertension Mother   . Hyperlipidemia Mother   . Heart disease Mother   . Cancer Sister     breast   Past Surgical History  Procedure Laterality Date  . Abdominal hysterectomy    . Cholecystectomy    . Appendectomy    . Vaginal delivery    . Tonsillectomy    . Adenoidectomy    . Broken wrist  Left   . Fracture surgery Left    Social History   Social History  . Marital Status: Widowed    Spouse Name: N/A  . Number of Children: N/A  . Years of Education: N/A   Social History Main Topics  . Smoking status: Never Smoker   . Smokeless tobacco: Never Used  . Alcohol Use: No  . Drug Use: No  . Sexual Activity: Not Asked   Other Topics Concern  . None   Social History Narrative   Lives with nephew in [place]. Helps with care of 2 grand-nephews.     Husband deceased.      Work - previously as Geophysicist/field seismologist    Review of Systems  Constitutional: Positive for fatigue. Negative for fever, chills, appetite change and unexpected weight change.  Eyes: Negative for visual disturbance.  Respiratory: Negative for shortness of breath.   Cardiovascular: Negative for chest pain and leg swelling.  Gastrointestinal: Positive for nausea, abdominal pain and diarrhea. Negative for vomiting, constipation and abdominal distention.  Musculoskeletal: Positive for myalgias and arthralgias.  Skin: Negative for color change and rash.  Hematological: Negative for adenopathy. Does not bruise/bleed easily.  Psychiatric/Behavioral: Positive for dysphoric mood. Negative for suicidal ideas and sleep disturbance. The patient is nervous/anxious.        Objective:    BP 103/70 mmHg  Pulse 75  Temp(Src) 98.7 F (37.1 C) (Oral)  Ht 5' (1.524 m)  Wt 148 lb 4 oz (67.246 kg)  BMI 28.95 kg/m2  SpO2 97% Physical Exam  Constitutional: [name], place, and time. She appears well-developed and well-nourished. No distress.  HENT:  Head: Normocephalic and atraumatic.  Right Ear: External ear normal.  Left Ear: External ear normal.  Nose: Nose normal.  Mouth/Throat: Oropharynx is clear and moist.  Eyes: Conjunctivae and EOM are normal. Pupils are  equal, round, and reactive to light. Right eye exhibits no discharge. Left eye exhibits no discharge. No scleral icterus.  Neck: Normal range of motion. Neck supple. No tracheal deviation present. No thyromegaly present.  Cardiovascular: Normal rate, regular rhythm, normal heart sounds and intact distal pulses.  Exam reveals no gallop and no friction rub.   No murmur heard. Pulmonary/Chest: Effort normal and breath sounds normal. No respiratory distress. She has no wheezes. She has no rales. She exhibits no tenderness.  Abdominal: Soft. Bowel sounds are normal. She exhibits no distension and no  mass. There is no tenderness. There is no rebound and no guarding.  Musculoskeletal: Normal range of motion. She exhibits no edema or tenderness.  Lymphadenopathy:    She has no cervical adenopathy.  Neurological: She is alert and oriented to person, place, and time. No cranial nerve deficit. She exhibits normal muscle tone. Coordination normal.  Skin: Skin is warm and dry. No rash noted. She is not diaphoretic. No erythema. No pallor.  Psychiatric: She has a normal mood and affect. Her behavior is normal. Judgment and thought content normal.          Assessment & Plan:   Problem List Items Addressed This Visit      Unprioritized   Depression - Primary    Difficult living situation resulting in increased anxiety and depression. Will start Paxil 10mg  daily with hopes of improving GI symptoms and anxiety/depression. Will set up counseling. Follow up in 4 weeks and prn.      Relevant Medications   PARoxetine (PAXIL) 10 MG tablet   Other Relevant Orders   Ambulatory referral to Psychology   Diarrhea    Persistent diarrhea and abdominal cramping pain over last 2 months. Suspect IBS and recent increased anxiety playing a role. However also discussed potential infectious causes and microscopic colitis. Will send repeat stool CDiff and culture. Set up GI evaluation with colonoscopy.      Relevant Orders   Ambulatory referral to Gastroenterology   Stool Culture   Stool C-Diff Toxin Assay   Giardia antigen       Return in about 4 weeks (around 02/25/2015) for Recheck.

## 2015-01-28 NOTE — Assessment & Plan Note (Signed)
Persistent diarrhea and abdominal cramping pain over last 2 months. Suspect IBS and recent increased anxiety playing a role. However also discussed potential infectious causes and microscopic colitis. Will send repeat stool CDiff and culture. Set up GI evaluation with colonoscopy.

## 2015-01-28 NOTE — Assessment & Plan Note (Signed)
Difficult living situation resulting in increased anxiety and depression. Will start Paxil 10mg  daily with hopes of improving GI symptoms and anxiety/depression. Will set up counseling. Follow up in 4 weeks and prn.

## 2015-01-28 NOTE — Progress Notes (Signed)
Pre visit review using our clinic review tool, if applicable. No additional management support is needed unless otherwise documented below in the visit note. 

## 2015-01-28 NOTE — Patient Instructions (Addendum)
We will set up evaluation for Colonoscopy to further evaluate the diarrhea.  Start Levsin as needed for abdominal cramping.  Please submit a stool sample for culture.  Start Paxil 10mg  daily.  Follow up 4 weeks.

## 2015-02-05 ENCOUNTER — Other Ambulatory Visit: Payer: Self-pay | Admitting: Otolaryngology

## 2015-02-05 DIAGNOSIS — E041 Nontoxic single thyroid nodule: Secondary | ICD-10-CM

## 2015-02-05 DIAGNOSIS — R1310 Dysphagia, unspecified: Secondary | ICD-10-CM

## 2015-02-11 ENCOUNTER — Encounter: Payer: Self-pay | Admitting: Pain Medicine

## 2015-02-11 ENCOUNTER — Ambulatory Visit: Payer: 59 | Attending: Pain Medicine | Admitting: Pain Medicine

## 2015-02-11 VITALS — BP 115/46 | HR 88 | Temp 98.6°F | Resp 14 | Ht 60.0 in | Wt 152.0 lb

## 2015-02-11 DIAGNOSIS — M79604 Pain in right leg: Secondary | ICD-10-CM | POA: Diagnosis present

## 2015-02-11 DIAGNOSIS — M5126 Other intervertebral disc displacement, lumbar region: Secondary | ICD-10-CM | POA: Diagnosis not present

## 2015-02-11 DIAGNOSIS — M545 Low back pain: Secondary | ICD-10-CM | POA: Diagnosis present

## 2015-02-11 DIAGNOSIS — M5116 Intervertebral disc disorders with radiculopathy, lumbar region: Secondary | ICD-10-CM | POA: Insufficient documentation

## 2015-02-11 DIAGNOSIS — M79605 Pain in left leg: Secondary | ICD-10-CM | POA: Diagnosis present

## 2015-02-11 DIAGNOSIS — M5136 Other intervertebral disc degeneration, lumbar region: Secondary | ICD-10-CM

## 2015-02-11 DIAGNOSIS — M533 Sacrococcygeal disorders, not elsewhere classified: Secondary | ICD-10-CM | POA: Insufficient documentation

## 2015-02-11 DIAGNOSIS — M79659 Pain in unspecified thigh: Secondary | ICD-10-CM

## 2015-02-11 DIAGNOSIS — M47816 Spondylosis without myelopathy or radiculopathy, lumbar region: Secondary | ICD-10-CM

## 2015-02-11 DIAGNOSIS — M5124 Other intervertebral disc displacement, thoracic region: Secondary | ICD-10-CM | POA: Insufficient documentation

## 2015-02-11 DIAGNOSIS — M48062 Spinal stenosis, lumbar region with neurogenic claudication: Secondary | ICD-10-CM

## 2015-02-11 DIAGNOSIS — R252 Cramp and spasm: Secondary | ICD-10-CM

## 2015-02-11 DIAGNOSIS — G5790 Unspecified mononeuropathy of unspecified lower limb: Secondary | ICD-10-CM

## 2015-02-11 MED ORDER — HYDROCODONE-ACETAMINOPHEN 5-325 MG PO TABS: ORAL_TABLET | ORAL | Status: DC

## 2015-02-11 NOTE — Patient Instructions (Addendum)
PLAN   Continue present medication hydrocodone acetaminophen  Lumbosacral selective nerve root block to be performed at time of return appointment  F/U PCP Dr. Ronette Deter for evaliation of  BP and general medical  condition   F/U surgical evaluation. May consider pending follow-up evaluations  F/U neurological evaluation. May consider pending follow-up evaluations  F/U  psych evaluation  May consider radiofrequency rhizolysis or intraspinal procedures pending response to present treatment and F/U evaluation   Patient to call Pain Management Center should patient have concerns prior to scheduled return appointment.  Selective Nerve Root Block Patient Information  Description: Specific nerve roots exit the spinal canal and these nerves can be compressed and inflamed by a bulging disc and bone spurs.  By injecting steroids on the nerve root, we can potentially decrease the inflammation surrounding these nerves, which often leads to decreased pain.  Also, by injecting local anesthesia on the nerve root, this can provide [place] helpful information to give to your referring doctor if it decreases your pain.  Selective nerve root blocks can be done along the spine from the neck to the low back depending on the location of your pain.   After numbing the skin with local anesthesia, a small needle is passed to the nerve root and the position of the needle is verified using x-ray pictures.  After the needle is in correct position, we then deposit the medication.  You may experience a pressure sensation while this is being done.  The entire block usually lasts less than 15 minutes.  Conditions that may be treated with selective nerve root blocks:  Low back and leg pain  Spinal stenosis  Diagnostic block prior to potential surgery  Neck and arm pain  Post laminectomy syndrome  Preparation for the injection:  1. Do not eat any solid food or dairy products within 6 hours of your  appointment. 2. You may drink clear liquids up to 2 hours before an appointment.  Clear liquids include water, black coffee, juice or soda.  No milk or cream please. 3. You may take your regular medications, including pain medications, with a sip of water before your appointment.  Diabetics should hold regular insulin (if taken separately) and take 1/2 normal NPH dose the morning of the procedure.  Carry some sugar containing items with you to your appointment. 4. A driver must accompany you and be prepared to drive you home after your procedure. 5. Bring all your current medications with you. 6. An IV may be inserted and sedation may be given at the discretion of the physician. 7. A blood pressure cuff, EKG, and other monitors will often be applied during the procedure.  Some patients may need to have extra oxygen administered for a short period. 8. You will be asked to provide medical information, including allergies, prior to the procedure.  We must know immediately if you are taking blood  Thinners (like Coumadin) or if you are allergic to IV iodine contrast (dye).  Possible side-effects: All are usually temporary  Bleeding from needle site  Light headedness  Numbness and tingling  Decreased blood pressure  Weakness in arms/legs  Pressure sensation in back/neck  Pain at injection site (several days)  Possible complications: All are extremely rare  Infection  Nerve injury  Spinal headache (a headache wore with upright position)  Call if you experience:  Fever/chills associated with headache or increased back/neck pain  Headache worsened by an upright position  New onset weakness or numbness of  an extremity below the injection site  Hives or difficulty breathing (go to the emergency room)  Inflammation or drainage at the injection site(s)  Severe back/neck pain greater than usual  New symptoms which are concerning to you  Please note:  Although the local  anesthetic injected can often make your back or neck feel good for several hours after the injection the pain will likely return.  It takes 3-5 days for steroids to work on the nerve root. You may not notice any pain relief for at least one week.  If effective, we will often do a series of 3 injections spaced 3-6 weeks apart to maximally decrease your pain.    If you have any questions, please call [phone] [facility]  A prescription for HYDROCODONE was given to you today.

## 2015-02-11 NOTE — Progress Notes (Signed)
   Subjective:    Patient ID: Hannah [name], female    DOB: 07-04-1950, 64 y.o.   MRN: [id number]  HPI   The patient is a 64 year old female who returns to pain management for further evaluation and treatment of pain involving the lower back and lower extremity regions predominantly. The patient states that she has had significant improvement of her pain with interventional treatment performed in pain management Center and states that she has return of significant pain at this time. Patient states the pain radiation the lumbar region to the lower extremity region and increases with activity on the feet. Patient is without trauma change in events of daily living the cost change in symptomatology. Patient continues present medication hydrocodone acetaminophen. We discussed patient's overall condition and will proceed with lumbosacral selective nerve root block at time return appointment in attempt to decrease severity of patient's symptoms, minimize progression of symptoms, and avoid the need for more involved treatment. The patient agreed to suggested treatment plan.     Review of Systems     Objective:   Physical Exam  Was mild tenderness to palpation of the splenius capitis and a separate talus musculature regions. There was mild tenderness of the thoracic facet thoracic paraspinal musculature region. There was unremarkable Spurling's maneuver and patient was with bilaterally equal grip strength with Tinel and Phalen's maneuver reproducing minimal discomfort. Palpation over the region of the lumbar paraspinal muscles lumbar facet region was of increased pain of moderate to moderately severe degree with lateral bending rotation extension and palpation of the lumbar facets reproducing moderately severe discomfort. Straight leg raising was tolerates approximately 20 without increased pain with dorsiflexion noted. There appeared to be negative clonus negative Homans. There was mild tenderness over  the PSIS and PI is regions as well as the greater trochanteric region and iliotibial band region. No sensory deficit or dermatomal distribution detected. There was negative clonus negative Homans. Abdomen nontender with no costovertebral tenderness noted.    Assessment & Plan:  Degenerative disc disease lumbar spine Kyphotic curvature at the 1112 disc space, spondylosis and shallow disc bulge at this level resulting in mild canal and moderate bilateral foraminal stenosis and mild anterior and superior T12 upper endplate depression, lack of marrow edema indicative of chronic change with mild osseous and disc changes at the remaining levels with adrenal nodule. Disc desiccation central right with the central disc bulge resulting and right foraminal narrowing L3-4 level with moderate left and right facet arthrosis. L4-L5 disc desiccation. Shallow broad-based disc bulging and narrowing of the central canal and left foramen at L4-L5 level  Lumbar radiculopathy  Lumbar facet syndrome  Sacroiliac joint dysfunction     PLANContinue present medication hydrocodone acetaminophen  Lumbosacral selective nerve root block to be performed at time of return appointment  F/U PCP Dr. Ronette Deter for evaliation of  BP and general medical  condition   F/U surgical evaluation. May consider pending follow-up evaluations  F/U neurological evaluation. May consider pending follow-up evaluations  F/U  psych evaluation  May consider radiofrequency rhizolysis or intraspinal procedures pending response to present treatment and F/U evaluation   Patient to call Pain Management Center should patient have concerns prior to scheduled return appointment.

## 2015-02-12 ENCOUNTER — Ambulatory Visit
Admission: RE | Admit: 2015-02-12 | Discharge: 2015-02-12 | Disposition: A | Discharge status: Home / Self Care | Payer: 59 | Source: Ambulatory Visit | Attending: Otolaryngology | Admitting: Otolaryngology

## 2015-02-12 DIAGNOSIS — R1310 Dysphagia, unspecified: Secondary | ICD-10-CM | POA: Insufficient documentation

## 2015-02-12 DIAGNOSIS — E041 Nontoxic single thyroid nodule: Secondary | ICD-10-CM | POA: Diagnosis not present

## 2015-02-12 MED ORDER — IOHEXOL 300 MG/ML  SOLN
75.0000 mL | Freq: Once | INTRAMUSCULAR | Status: AC | PRN
  Administered 2015-02-12: 60 mL via INTRAVENOUS

## 2015-02-13 ENCOUNTER — Ambulatory Visit: Payer: 59 | Admitting: Pain Medicine

## 2015-02-18 ENCOUNTER — Ambulatory Visit (INDEPENDENT_AMBULATORY_CARE_PROVIDER_SITE_OTHER): Payer: 59 | Admitting: Licensed Clinical Social Worker

## 2015-02-18 DIAGNOSIS — F330 Major depressive disorder, recurrent, mild: Secondary | ICD-10-CM

## 2015-02-18 DIAGNOSIS — F411 Generalized anxiety disorder: Secondary | ICD-10-CM
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
## 2015-02-19 ENCOUNTER — Ambulatory Visit (INDEPENDENT_AMBULATORY_CARE_PROVIDER_SITE_OTHER): Payer: 59 | Admitting: Internal Medicine

## 2015-02-19 ENCOUNTER — Encounter: Payer: Self-pay | Admitting: Internal Medicine

## 2015-02-19 VITALS — BP 123/82 | HR 77 | Temp 98.2°F | Ht 60.0 in | Wt 149.5 lb

## 2015-02-19 DIAGNOSIS — R9431 Abnormal electrocardiogram [ECG] [EKG]: Secondary | ICD-10-CM | POA: Insufficient documentation

## 2015-02-19 DIAGNOSIS — Z0000 Encounter for general adult medical examination without abnormal findings: Secondary | ICD-10-CM | POA: Insufficient documentation

## 2015-02-19 DIAGNOSIS — Z01818 Encounter for other preprocedural examination: Secondary | ICD-10-CM

## 2015-02-19 LAB — CBC WITH DIFFERENTIAL/PLATELET
BASOS ABS: 0 10*3/uL (ref 0.0–0.1)
Basophils Relative: 0.6 % (ref 0.0–3.0)
Eosinophils Absolute: 0.1 10*3/uL (ref 0.0–0.7)
Eosinophils Relative: 2.3 % (ref 0.0–5.0)
HEMATOCRIT: 41.5 % (ref 36.0–46.0)
Hemoglobin: 13.6 g/dL (ref 12.0–15.0)
LYMPHS PCT: 40.8 % (ref 12.0–46.0)
Lymphs Abs: 2.5 10*3/uL (ref 0.7–4.0)
MCHC: 32.9 g/dL (ref 30.0–36.0)
MCV: 91.8 fl (ref 78.0–100.0)
MONOS PCT: 9.9 % (ref 3.0–12.0)
Monocytes Absolute: 0.6 10*3/uL (ref 0.1–1.0)
Neutro Abs: 2.8 10*3/uL (ref 1.4–7.7)
Neutrophils Relative %: 46.4 % (ref 43.0–77.0)
Platelets: 307 10*3/uL (ref 150.0–400.0)
RBC: 4.52 Mil/uL (ref 3.87–5.11)
RDW: 14.3 % (ref 11.5–15.5)
WBC: 6 10*3/uL (ref 4.0–10.5)

## 2015-02-19 LAB — COMPREHENSIVE METABOLIC PANEL
ALK PHOS: 105 U/L (ref 39–117)
ALT: 27 U/L (ref 0–35)
AST: 29 U/L (ref 0–37)
Albumin: 4.1 g/dL (ref 3.5–5.2)
BILIRUBIN TOTAL: 0.6 mg/dL (ref 0.2–1.2)
BUN: 15 mg/dL (ref 6–23)
CALCIUM: 9.5 mg/dL (ref 8.4–10.5)
CO2: 33 mEq/L — ABNORMAL HIGH (ref 19–32)
CREATININE: 1.1 mg/dL (ref 0.40–1.20)
Chloride: 104 mEq/L (ref 96–112)
GFR: 53.31 mL/min — AB (ref >60.00)
GLUCOSE: 87 mg/dL (ref 70–99)
Potassium: 4.5 mEq/L (ref 3.5–5.1)
Sodium: 142 mEq/L (ref 135–145)
TOTAL PROTEIN: 6.6 g/dL (ref 6.0–8.3)

## 2015-02-19 LAB — POCT URINALYSIS DIPSTICK
Glucose, UA: NEGATIVE
Leukocytes, UA: NEGATIVE
NITRITE UA: NEGATIVE
PH UA: 6
RBC UA: NEGATIVE
SPEC GRAV UA: 1.02
UROBILINOGEN UA: 0.2

## 2015-02-19 NOTE — Assessment & Plan Note (Signed)
Exam today is normal. Will check CBC and CMP. EKG today shows some non-specific changes. Given that this is elective surgery, will set up cardiology evaluation prior to procedure.

## 2015-02-19 NOTE — Progress Notes (Signed)
Subjective:    Patient ID: Hannah [name], female    DOB: [birth year], 64 y.o.   MRN: [id number]  HPI  64YO female presents for preop evaluation.  Scheduled for thyroidectomy. Hoping that pain in mouth and neck will improve.  Continues to have some intermittent diarrhea and constipation. This is chronic for her. No recent fever. Some mild intermittent burning with urination. No frequency or urgency noted. No flank pain. Had some sweating this morning, which lasted  A few min and then subsided.  No previous issues with anesthesia. No history of bleeding problems.  Wt Readings from Last 3 Encounters:  02/19/15 149 lb 8 oz (67.813 kg)  02/11/15 152 lb (68.947 kg)  01/28/15 148 lb 4 oz (67.246 kg)   BP Readings from Last 3 Encounters:  02/19/15 123/82  02/11/15 115/46  01/28/15 103/70    Past Medical History  Diagnosis Date  . GERD (gastroesophageal reflux disease)   . Degenerative joint disease of spine   . Hypertension   . Hyperlipidemia   . Depression     Followed in past by Dr. Randel Books  . Hypercholesteremia    Family History  Problem Relation Age of Onset  . Hypertension Mother   . Hyperlipidemia Mother   . Heart disease Mother   . Cancer Sister     breast   Past Surgical History  Procedure Laterality Date  . Abdominal hysterectomy    . Cholecystectomy    . Appendectomy    . Vaginal delivery    . Tonsillectomy    . Adenoidectomy    . Broken wrist  Left   . Fracture surgery Left    Social History   Social History  . Marital Status: Widowed    Spouse Name: N/A  . Number of Children: N/A  . Years of Education: N/A   Social History Main Topics  . Smoking status: Never Smoker   . Smokeless tobacco: Never Used  . Alcohol Use: No  . Drug Use: No  . Sexual Activity: Not Asked   Other Topics Concern  . None   Social History Narrative   Lives with nephew in [place]. Helps with care of 2 grand-nephews.   Husband deceased.      Work -  previously as Geophysicist/field seismologist    Review of Systems  Constitutional: Negative for fever, chills, appetite change, fatigue and unexpected weight change.  HENT: Positive for trouble swallowing (pain with swallowing). Negative for congestion and sore throat.   Eyes: Negative for visual disturbance.  Respiratory: Negative for shortness of breath.   Cardiovascular: Negative for chest pain and leg swelling.  Gastrointestinal: Positive for diarrhea and constipation. Negative for nausea, vomiting and abdominal pain.  Genitourinary: Positive for dysuria. Negative for urgency, frequency, hematuria and difficulty urinating.  Musculoskeletal: Positive for neck pain.  Skin: Negative for color change and rash.  Hematological: Negative for adenopathy. Does not bruise/bleed easily.  Psychiatric/Behavioral: Negative for sleep disturbance and dysphoric mood. The patient is not nervous/anxious.        Objective:    BP 123/82 mmHg  Pulse 77  Temp(Src) 98.2 F (36.8 C) (Oral)  Ht 5' (1.524 m)  Wt 149 lb 8 oz (67.813 kg)  BMI 29.20 kg/m2  SpO2 95% Physical Exam  Constitutional: [name], place, and time. She appears well-developed and well-nourished. No distress.  HENT:  Head: Normocephalic and atraumatic.  Right Ear: External ear normal.  Left Ear: External ear normal.  Nose:  Nose normal.  Mouth/Throat: Oropharynx is clear and moist. No oropharyngeal exudate.  Eyes: Conjunctivae are normal. Pupils are equal, round, and reactive to light. Right eye exhibits no discharge. Left eye exhibits no discharge. No scleral icterus.  Neck: Normal range of motion. Neck supple. No tracheal deviation present. No thyromegaly present.  Cardiovascular: Normal rate, regular rhythm, normal heart sounds and intact distal pulses.  Exam reveals no gallop and no friction rub.   No murmur heard. Pulmonary/Chest: Effort normal and breath sounds normal. No respiratory distress. She has no wheezes. She  has no rales. She exhibits no tenderness.  Musculoskeletal: Normal range of motion. She exhibits no edema or tenderness.  Lymphadenopathy:    She has no cervical adenopathy.  Neurological: She is alert and oriented to person, place, and time. No cranial nerve deficit. She exhibits normal muscle tone. Coordination normal.  Skin: Skin is warm and dry. No rash noted. She is not diaphoretic. No erythema. No pallor.  Psychiatric: She has a normal mood and affect. Her behavior is normal. Judgment and thought content normal.          Assessment & Plan:   Problem List Items Addressed This Visit      Unprioritized   Nonspecific abnormal electrocardiogram (ECG) (EKG)    EKG shows some non-specific changes. No current chest pain. No previous EKGs for comparison. Pt has history of hypertension. Will set up cardiology evaluation prior to surgery.       Relevant Orders   Ambulatory referral to Cardiology   Preop examination - Primary    Exam today is normal. Will check CBC and CMP. EKG today shows some non-specific changes. Given that this is elective surgery, will set up cardiology evaluation prior to procedure.      Relevant Orders   CBC with Differential/Platelet   Comprehensive metabolic panel   POCT Urinalysis Dipstick (Completed)   EKG 12-Lead (Completed)       Return in about 3 months (around 05/21/2015) for Recheck.

## 2015-02-19 NOTE — Progress Notes (Signed)
Pre visit review using our clinic review tool, if applicable. No additional management support is needed unless otherwise documented below in the visit note. 

## 2015-02-19 NOTE — Assessment & Plan Note (Signed)
EKG shows some non-specific changes. No current chest pain. No previous EKGs for comparison. Pt has history of hypertension. Will set up cardiology evaluation prior to surgery.

## 2015-02-19 NOTE — Patient Instructions (Signed)
We will set up a cardiology evaluation prior to your upcoming surgery.  Follow up after surgery complete.

## 2015-02-20 ENCOUNTER — Encounter
Admission: RE | Admit: 2015-02-20 | Discharge: 2015-02-20 | Disposition: A | Discharge status: Home / Self Care | Payer: 59 | Source: Ambulatory Visit | Attending: Otolaryngology | Admitting: Otolaryngology

## 2015-02-20 ENCOUNTER — Telehealth: Payer: Self-pay | Admitting: Internal Medicine

## 2015-02-20 NOTE — OR Nursing (Signed)
Patients PMD setting up stress test for patient prior to procedure, abnormal ECG noted.

## 2015-02-20 NOTE — Patient Instructions (Signed)
  Your procedure is scheduled on: Thursday 02/28/2015 Report to Day Surgery. 2ND FLOOR MEDICAL MALL ENTRANCE To find out your arrival time please call [phone] between 1PM - 3PM on Wednesday 12/7/216 .  Remember: Instructions that are not followed completely may result in serious medical risk, up to and including death, or upon the discretion of your surgeon and anesthesiologist your surgery may need to be rescheduled.    __X_ 1. Do not eat food or drink liquids after midnight. No gum chewing or hard candies.     __X__ 2. No Alcohol for 24 hours before or after surgery.   ____ 3. Bring all medications with you on the day of surgery if instructed.    __X__ 4. Notify your doctor if there is any change in your medical condition     (cold, fever, infections).     Do not wear jewelry, make-up, hairpins, clips or nail polish.  Do not wear lotions, powders, or perfumes. .  Do not shave 48 hours prior to surgery. Men may shave face and neck.  Do not bring valuables to the hospital.    [facility] is not responsible for any belongings or valuables.               Contacts, dentures or bridgework may not be worn into surgery.  Leave your suitcase in the car. After surgery it may be brought to your room.  For patients admitted to the hospital, discharge time is determined by your                treatment team.   Patients discharged the day of surgery will not be allowed to drive home.   Please read over the following fact sheets that you were given:   Surgical Site Infection Prevention   __X__ Take these medicines the morning of surgery with A SIP OF WATER:    1. AMLODIPINE  2. GABAPENTIN  3. PAROXETINE  4. OMEPRAZOLE  5.  6.  ____ Fleet Enema (as directed)   ____ Use CHG Soap as directed  ____ Use inhalers on the day of surgery  ____ Stop metformin 2 days prior to surgery    ____ Take 1/2 of usual insulin dose the night before surgery and none on the morning of surgery.    ____ Stop Coumadin/Plavix/aspirin on   __X__ Stop Anti-inflammatories on [place]   ____ Stop supplements until after surgery.    ____ Bring C-Pap to the hospital.

## 2015-02-20 NOTE — Telephone Encounter (Signed)
Cherie called from [facility] [address] [id number] Please fax [phone] over a copy of up coming stress to Pre Admit and clearance note. Thank You!

## 2015-02-22 ENCOUNTER — Other Ambulatory Visit: Payer: Self-pay | Admitting: Internal Medicine

## 2015-02-22 ENCOUNTER — Ambulatory Visit (INDEPENDENT_AMBULATORY_CARE_PROVIDER_SITE_OTHER): Payer: 59 | Admitting: Cardiovascular Disease

## 2015-02-22 ENCOUNTER — Encounter: Payer: Self-pay | Admitting: Cardiovascular Disease

## 2015-02-22 VITALS — BP 128/88 | HR 72 | Ht 60.0 in | Wt 150.0 lb

## 2015-02-22 DIAGNOSIS — Z01810 Encounter for preprocedural cardiovascular examination: Secondary | ICD-10-CM

## 2015-02-22 DIAGNOSIS — I10 Essential (primary) hypertension: Secondary | ICD-10-CM

## 2015-02-22 DIAGNOSIS — Z01818 Encounter for other preprocedural examination: Secondary | ICD-10-CM | POA: Diagnosis not present

## 2015-02-22 DIAGNOSIS — R9431 Abnormal electrocardiogram [ECG] [EKG]: Secondary | ICD-10-CM | POA: Diagnosis not present

## 2015-02-22 DIAGNOSIS — E042 Nontoxic multinodular goiter: Secondary | ICD-10-CM

## 2015-02-22 NOTE — Assessment & Plan Note (Signed)
Normal EKG on today's visit as detailed above. No further testing needed

## 2015-02-22 NOTE — Assessment & Plan Note (Signed)
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

## 2015-02-22 NOTE — Assessment & Plan Note (Addendum)
Nonspecific T wave abnormality seen in V5 and V6 on EKG with primary care has resolved, normal EKG on today's visit. Unclear if initial abnormality was secondary to lead placement CT scan of her neck and upper chest evaluated with no atherosclerosis noted in her vasculature, placing her at low risk of underlying coronary artery disease. Rare episodes of chest squeezing, atypical in nature, at rest. No symptoms on exertion. Overall she would be acceptable risk for upcoming surgery. No further testing needed

## 2015-02-22 NOTE — Patient Instructions (Signed)
You are doing well. No medication changes were made.  Please call us if you have new issues that need to be addressed before your next appt.    

## 2015-02-22 NOTE — Progress Notes (Signed)
Patient ID: Hannah [name], female    DOB: [birth year], 64 y.o.   MRN: [id number]  HPI Comments: Ms. 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 Gilford Rile with T wave abnormality in V5 and V6 who presents for further evaluation.  In general she reports that she is doing well. She is active at baseline. She has rare episodes of a chest tightness typically presenting at rest. Denies having any symptoms on exertion. Describes it as a pressure, sometimes worse with palpation. Usually drinks ice water and symptoms will resolve. No escalation in her symptoms, she has had this for quite some time.  Otherwise no regular exercise program but very active She has been feeling a pressure in her neck that is been getting worse. She reports blood pressure has been well-controlled  She denies any diabetes, no smoking history, LDL is 137  Review of CT scan of the neck and upper chest shows atherosclerosis in her vasculature Carotids reviewed, subclavian arteries, aortic arch with no disease  EKG on today's visit shows normal sinus rhythm with rate 72 bpm, no significant ST or T-wave changes EKG done through primary care 02/19/2015 with normal sinus rhythm, T-wave abnormality V5 and V6, unclear if this is secondary to lead placement  Allergies  Allergen Reactions  . No Known Allergies     Current Outpatient Prescriptions on File Prior to Visit  Medication Sig Dispense Refill  . amLODipine (NORVASC) 5 MG tablet Take 1 tablet (5 mg total) by mouth daily. 90 tablet 3  . cyclobenzaprine (FLEXERIL) 10 MG tablet Take 1 tablet (10 mg total) by mouth 3 (three) times daily as needed for muscle spasms. 30 tablet 0  . gabapentin (NEURONTIN) 300 MG capsule Take 2 capsules (600 mg total) by mouth 2 (two) times daily. 180 capsule 3  .  HYDROcodone-acetaminophen (NORCO/VICODIN) 5-325 MG tablet Limit one tab per day or twice per day if tolerated 50 tablet 0  . naproxen sodium (ALEVE) 220 MG tablet Take 220 mg by mouth 2 (two) times daily as needed.     Marland Kitchen omeprazole (PRILOSEC) 40 MG capsule TAKE ONE CAPSULE BY MOUTH ONCE DAILY 30 capsule 5  . PARoxetine (PAXIL) 10 MG tablet Take 1 tablet (10 mg total) by mouth daily. 30 tablet 3   No current facility-administered medications on file prior to visit.    Past Medical History  Diagnosis Date  . GERD (gastroesophageal reflux disease)   . Degenerative joint disease of spine   . Hypertension   . Hyperlipidemia   . Depression     Followed in past by Dr. Randel Books  . Hypercholesteremia   . Heart murmur   . Carotid artery occlusion     Past Surgical History  Procedure Laterality Date  . Abdominal hysterectomy    . Cholecystectomy    . Appendectomy    . Vaginal delivery    . Tonsillectomy    . Adenoidectomy    . Broken wrist  Left   . Fracture surgery Left     Social History  reports that she has never smoked. She has never used smokeless tobacco. She reports that she does not drink alcohol or use illicit drugs.  Family History family history includes Cancer in her sister; Heart disease in her mother; Hyperlipidemia in her mother; Hypertension in her mother.  Review of Systems  Constitutional: Negative.   Respiratory: Positive for chest tightness.   Cardiovascular: Negative.   Gastrointestinal: Negative.   Musculoskeletal: Positive for neck pain.  Neurological: Negative.   Hematological: Negative.   Psychiatric/Behavioral: Negative.   All other systems reviewed and are negative.   BP 128/88 mmHg  Pulse 72  Ht 5' (1.524 m)  Wt 150 lb (68.04 kg)  BMI 29.30 kg/m2  Physical Exam  Constitutional: [name], place, and time. She appears well-developed and well-nourished.  HENT:  Head: Normocephalic.  Nose: Nose normal.  Mouth/Throat:  Oropharynx is clear and moist.  Eyes: Conjunctivae are normal. Pupils are equal, round, and reactive to light.  Neck: Normal range of motion. Neck supple. No JVD present.  Cardiovascular: Normal rate, regular rhythm, normal heart sounds and intact distal pulses.  Exam reveals no gallop and no friction rub.   No murmur heard. Pulmonary/Chest: Effort normal and breath sounds normal. No respiratory distress. She has no wheezes. She has no rales. She exhibits no tenderness.  Abdominal: Soft. Bowel sounds are normal. She exhibits no distension. There is no tenderness.  Musculoskeletal: Normal range of motion. She exhibits no edema or tenderness.  Lymphadenopathy:    She has no cervical adenopathy.  Neurological: She is alert and oriented to person, place, and time. Coordination normal.  Skin: Skin is warm and dry. No rash noted. No erythema.  Psychiatric: She has a normal mood and affect. Her behavior is normal. Judgment and thought content normal.

## 2015-02-22 NOTE — Assessment & Plan Note (Signed)
Blood pressure is well controlled on today's visit. No changes made to the medications. 

## 2015-02-26 ENCOUNTER — Telehealth: Payer: Self-pay

## 2015-02-26 NOTE — Pre-Procedure Instructions (Signed)
Cleared by Dr Rockey Situ acceptable risk on 02/22/15

## 2015-02-26 NOTE — Telephone Encounter (Signed)
Received cardiac clearance request for pt to proceed w/ rt hemi thyroidectomy w/ Dr. Pryor Ochoa @ [facility]. Per Dr. Rockey Situ, pt is cleared to proceed @ low risk.  Faxed to [phone].

## 2015-02-27 ENCOUNTER — Ambulatory Visit: Payer: 59 | Admitting: Psychiatry

## 2015-02-28 ENCOUNTER — Encounter: Payer: Self-pay | Admitting: *Deleted

## 2015-02-28 ENCOUNTER — Ambulatory Visit: Payer: 59 | Admitting: Anesthesiology

## 2015-02-28 ENCOUNTER — Encounter
Admission: RE | Disposition: A | Discharge status: Home / Self Care | Payer: Self-pay | Source: Ambulatory Visit | Attending: Otolaryngology

## 2015-02-28 ENCOUNTER — Observation Stay
Admission: RE | Admit: 2015-02-28 | Discharge: 2015-03-01 | Disposition: A | Discharge status: Home / Self Care | Payer: 59 | Source: Ambulatory Visit | Attending: Otolaryngology | Admitting: Otolaryngology

## 2015-02-28 DIAGNOSIS — E785 Hyperlipidemia, unspecified: Secondary | ICD-10-CM | POA: Diagnosis not present

## 2015-02-28 DIAGNOSIS — D34 Benign neoplasm of thyroid gland: Secondary | ICD-10-CM | POA: Diagnosis not present

## 2015-02-28 DIAGNOSIS — E7800 Pure hypercholesterolemia, unspecified: Secondary | ICD-10-CM | POA: Insufficient documentation

## 2015-02-28 DIAGNOSIS — R1310 Dysphagia, unspecified: Secondary | ICD-10-CM | POA: Diagnosis not present

## 2015-02-28 DIAGNOSIS — Z79891 Long term (current) use of opiate analgesic: Secondary | ICD-10-CM | POA: Diagnosis not present

## 2015-02-28 DIAGNOSIS — Z90710 Acquired absence of both cervix and uterus: Secondary | ICD-10-CM | POA: Diagnosis not present

## 2015-02-28 DIAGNOSIS — Z9889 Other specified postprocedural states: Secondary | ICD-10-CM | POA: Diagnosis not present

## 2015-02-28 DIAGNOSIS — F329 Major depressive disorder, single episode, unspecified: Secondary | ICD-10-CM | POA: Diagnosis not present

## 2015-02-28 DIAGNOSIS — Z79899 Other long term (current) drug therapy: Secondary | ICD-10-CM | POA: Diagnosis not present

## 2015-02-28 DIAGNOSIS — K219 Gastro-esophageal reflux disease without esophagitis: Secondary | ICD-10-CM | POA: Diagnosis not present

## 2015-02-28 DIAGNOSIS — Z9049 Acquired absence of other specified parts of digestive tract: Secondary | ICD-10-CM | POA: Diagnosis not present

## 2015-02-28 DIAGNOSIS — M479 Spondylosis, unspecified: Secondary | ICD-10-CM | POA: Insufficient documentation

## 2015-02-28 DIAGNOSIS — E041 Nontoxic single thyroid nodule: Secondary | ICD-10-CM | POA: Diagnosis present

## 2015-02-28 DIAGNOSIS — I10 Essential (primary) hypertension: Secondary | ICD-10-CM | POA: Insufficient documentation

## 2015-02-28 DIAGNOSIS — E890 Postprocedural hypothyroidism: Secondary | ICD-10-CM

## 2015-02-28 HISTORY — PX: THYROIDECTOMY: SHX17

## 2015-02-28 SURGERY — THYROIDECTOMY
Anesthesia: General | Laterality: Right

## 2015-02-28 MED ORDER — LIDOCAINE HCL (CARDIAC) 20 MG/ML IV SOLN
INTRAVENOUS | Status: DC | PRN
  Administered 2015-02-28: 60 mg via INTRAVENOUS

## 2015-02-28 MED ORDER — GABAPENTIN 300 MG PO CAPS
600.0000 mg | ORAL_CAPSULE | Freq: Two times a day (BID) | ORAL | Status: DC
  Administered 2015-02-28 – 2015-03-01 (×2): 600 mg via ORAL
  Filled 2015-02-28 (×3): qty 2

## 2015-02-28 MED ORDER — ACETAMINOPHEN 10 MG/ML IV SOLN
INTRAVENOUS | Status: AC
  Filled 2015-02-28: qty 100

## 2015-02-28 MED ORDER — FENTANYL CITRATE (PF) 250 MCG/5ML IJ SOLN: INTRAMUSCULAR | Status: DC | PRN

## 2015-02-28 MED ORDER — LACTATED RINGERS IV SOLN
INTRAVENOUS | Status: DC
  Administered 2015-02-28 (×2): via INTRAVENOUS

## 2015-02-28 MED ORDER — DEXMEDETOMIDINE HCL IN NACL 200 MCG/50ML IV SOLN
INTRAVENOUS | Status: DC | PRN
  Administered 2015-02-28: .1 ug/kg/h via INTRAVENOUS

## 2015-02-28 MED ORDER — FENTANYL CITRATE (PF) 250 MCG/5ML IJ SOLN
INTRAMUSCULAR | Status: DC | PRN
  Administered 2015-02-28 (×2): 100 ug via INTRAVENOUS
  Administered 2015-02-28: 50 ug via INTRAVENOUS

## 2015-02-28 MED ORDER — PANTOPRAZOLE SODIUM 40 MG PO TBEC
40.0000 mg | DELAYED_RELEASE_TABLET | Freq: Every day | ORAL | Status: DC
  Administered 2015-03-01: 40 mg via ORAL
  Filled 2015-02-28 (×2): qty 1

## 2015-02-28 MED ORDER — HYDROCODONE-ACETAMINOPHEN 7.5-325 MG/15ML PO SOLN
10.0000 mL | ORAL | Status: DC | PRN
  Administered 2015-02-28 – 2015-03-01 (×2): 15 mL via ORAL
  Filled 2015-02-28 (×2): qty 15

## 2015-02-28 MED ORDER — FENTANYL CITRATE (PF) 100 MCG/2ML IJ SOLN
INTRAMUSCULAR | Status: AC
  Administered 2015-02-28: 25 ug via INTRAVENOUS
  Filled 2015-02-28: qty 2

## 2015-02-28 MED ORDER — BACITRACIN 500 UNIT/GM EX OINT
1.0000 "application " | TOPICAL_OINTMENT | Freq: Three times a day (TID) | CUTANEOUS | Status: DC
  Administered 2015-02-28 – 2015-03-01 (×3): 1 via TOPICAL
  Filled 2015-02-28 (×6): qty 0.9

## 2015-02-28 MED ORDER — ACETAMINOPHEN 160 MG/5ML PO SOLN: 650.0000 mg | ORAL | Status: DC | PRN

## 2015-02-28 MED ORDER — ONDANSETRON HCL 4 MG PO TABS: 4.0000 mg | ORAL_TABLET | ORAL | Status: DC | PRN

## 2015-02-28 MED ORDER — ONDANSETRON HCL 4 MG/2ML IJ SOLN
INTRAMUSCULAR | Status: DC | PRN
  Administered 2015-02-28: 4 mg via INTRAVENOUS

## 2015-02-28 MED ORDER — PROPOFOL 10 MG/ML IV BOLUS
INTRAVENOUS | Status: DC | PRN
Start: 2015-02-28 — End: 2015-02-28
  Administered 2015-02-28: 150 mg via INTRAVENOUS

## 2015-02-28 MED ORDER — DEXTROSE-NACL 5-0.45 % IV SOLN
INTRAVENOUS | Status: DC
  Administered 2015-02-28: 12:00:00 via INTRAVENOUS

## 2015-02-28 MED ORDER — ONDANSETRON HCL 4 MG/2ML IJ SOLN: 4.0000 mg | Freq: Once | INTRAMUSCULAR | Status: DC | PRN

## 2015-02-28 MED ORDER — LIDOCAINE HCL (CARDIAC) 20 MG/ML IV SOLN: INTRAVENOUS | Status: DC | PRN

## 2015-02-28 MED ORDER — SENNOSIDES-DOCUSATE SODIUM 8.6-50 MG PO TABS: 1.0000 | ORAL_TABLET | Freq: Every evening | ORAL | Status: DC | PRN

## 2015-02-28 MED ORDER — ROCURONIUM BROMIDE 100 MG/10ML IV SOLN
INTRAVENOUS | Status: DC | PRN
Start: 2015-02-28 — End: 2015-02-28
  Administered 2015-02-28: 10 mg via INTRAVENOUS

## 2015-02-28 MED ORDER — MORPHINE SULFATE (PF) 2 MG/ML IV SOLN
2.0000 mg | INTRAVENOUS | Status: DC | PRN
  Administered 2015-02-28: 4 mg via INTRAVENOUS
  Administered 2015-03-01: 2 mg via INTRAVENOUS
  Filled 2015-02-28: qty 2
  Filled 2015-02-28: qty 1

## 2015-02-28 MED ORDER — ACETAMINOPHEN 650 MG RE SUPP: 650.0000 mg | RECTAL | Status: DC | PRN

## 2015-02-28 MED ORDER — BUPIVACAINE-EPINEPHRINE (PF) 0.25% -1:200000 IJ SOLN
INTRAMUSCULAR | Status: AC
  Filled 2015-02-28: qty 30

## 2015-02-28 MED ORDER — HYOSCYAMINE SULFATE 0.125 MG SL SUBL
0.1250 mg | SUBLINGUAL_TABLET | SUBLINGUAL | Status: DC | PRN
  Filled 2015-02-28: qty 1

## 2015-02-28 MED ORDER — AMLODIPINE BESYLATE 5 MG PO TABS
5.0000 mg | ORAL_TABLET | Freq: Every day | ORAL | Status: DC
  Administered 2015-03-01: 5 mg via ORAL
  Filled 2015-02-28 (×2): qty 1

## 2015-02-28 MED ORDER — FENTANYL CITRATE (PF) 100 MCG/2ML IJ SOLN
25.0000 ug | INTRAMUSCULAR | Status: DC | PRN
  Administered 2015-02-28: 25 ug via INTRAVENOUS

## 2015-02-28 MED ORDER — ONDANSETRON HCL 4 MG/2ML IJ SOLN: 4.0000 mg | INTRAMUSCULAR | Status: DC | PRN

## 2015-02-28 MED ORDER — PAROXETINE HCL 10 MG PO TABS
10.0000 mg | ORAL_TABLET | Freq: Every day | ORAL | Status: DC
  Administered 2015-03-01: 10 mg via ORAL
  Filled 2015-02-28 (×2): qty 1

## 2015-02-28 MED ORDER — ACETAMINOPHEN 10 MG/ML IV SOLN
INTRAVENOUS | Status: DC | PRN
  Administered 2015-02-28: 1000 mg via INTRAVENOUS

## 2015-02-28 MED ORDER — DEXAMETHASONE SODIUM PHOSPHATE 10 MG/ML IJ SOLN
INTRAMUSCULAR | Status: DC | PRN
  Administered 2015-02-28: 10 mg via INTRAVENOUS

## 2015-02-28 MED ORDER — BACITRACIN 500 UNIT/GM EX OINT
TOPICAL_OINTMENT | CUTANEOUS | Status: DC | PRN
Start: 2015-02-28 — End: 2015-02-28
  Administered 2015-02-28: 1 via TOPICAL

## 2015-02-28 MED ORDER — SUCCINYLCHOLINE CHLORIDE 20 MG/ML IJ SOLN
INTRAMUSCULAR | Status: DC | PRN
  Administered 2015-02-28: 100 mg via INTRAVENOUS

## 2015-02-28 MED ORDER — BUPIVACAINE-EPINEPHRINE (PF) 0.25% -1:200000 IJ SOLN
INTRAMUSCULAR | Status: DC | PRN
  Administered 2015-02-28: 7 mL via PERINEURAL

## 2015-02-28 MED ORDER — BACITRACIN ZINC 500 UNIT/GM EX OINT
TOPICAL_OINTMENT | CUTANEOUS | Status: AC
Start: 2015-02-28 — End: 2015-02-28
  Filled 2015-02-28: qty 28.35

## 2015-02-28 MED ORDER — MIDAZOLAM HCL 5 MG/5ML IJ SOLN
INTRAMUSCULAR | Status: DC | PRN
  Administered 2015-02-28: 2 mg via INTRAVENOUS

## 2015-02-28 SURGICAL SUPPLY — 36 items
BLADE SURG 15 STRL LF DISP TIS (BLADE) ×1 IMPLANT
BLADE SURG 15 STRL SS (BLADE) ×2
CANISTER SUCT 1200ML W/VALVE (MISCELLANEOUS) ×3 IMPLANT
CLOSURE WOUND 1/4X4 (GAUZE/BANDAGES/DRESSINGS)
CORD BIP STRL DISP 12FT (MISCELLANEOUS) ×3 IMPLANT
DRAIN TLS ROUND 10FR (DRAIN) IMPLANT
DRAPE MAG INST 16X20 L/F (DRAPES) ×3 IMPLANT
DRSG TEGADERM 2-3/8X2-3/4 SM (GAUZE/BANDAGES/DRESSINGS) IMPLANT
ELECT LARYNGEAL 6/7 (MISCELLANEOUS) ×3
ELECT LARYNGEAL 8/9 (MISCELLANEOUS) ×3
ELECTRODE LARYNGEAL 6/7 (MISCELLANEOUS) ×1 IMPLANT
ELECTRODE LARYNGEAL 8/9 (MISCELLANEOUS) ×1 IMPLANT
FORCEPS JEWEL BIP 4-3/4 STR (INSTRUMENTS) ×3 IMPLANT
GLOVE BIO SURGEON STRL SZ7.5 (GLOVE) ×6 IMPLANT
GOWN STRL REUS W/ TWL LRG LVL3 (GOWN DISPOSABLE) ×3 IMPLANT
GOWN STRL REUS W/TWL LRG LVL3 (GOWN DISPOSABLE) ×6
HARMONIC SCALPEL FOCUS (MISCELLANEOUS) ×3 IMPLANT
HEMOSTAT SURGICEL 2X3 (HEMOSTASIS) ×3 IMPLANT
HOOK STAY 5M SHARP BLUNT 3316- (MISCELLANEOUS) ×3 IMPLANT
KIT RM TURNOVER STRD PROC AR (KITS) ×3 IMPLANT
LABEL OR SOLS (LABEL) ×3 IMPLANT
LIQUID BAND (GAUZE/BANDAGES/DRESSINGS) IMPLANT
NS IRRIG 500ML POUR BTL (IV SOLUTION) ×3 IMPLANT
PACK HEAD/NECK (MISCELLANEOUS) ×3 IMPLANT
PAD GROUND ADULT SPLIT (MISCELLANEOUS) ×3 IMPLANT
PROBE NEUROSIGN BIPOL (MISCELLANEOUS) ×1 IMPLANT
PROBE NEUROSIGN BIPOLAR (MISCELLANEOUS) ×2
SPONGE KITTNER 5P (MISCELLANEOUS) ×3 IMPLANT
SPONGE XRAY 4X4 16PLY STRL (MISCELLANEOUS) IMPLANT
STRIP CLOSURE SKIN 1/4X4 (GAUZE/BANDAGES/DRESSINGS) IMPLANT
SUT PROLENE 6 0 P 1 18 (SUTURE) ×3 IMPLANT
SUT SILK 2 0 (SUTURE) ×2
SUT SILK 2 0 SH (SUTURE) ×3 IMPLANT
SUT SILK 2-0 18XBRD TIE 12 (SUTURE) ×1 IMPLANT
SUT VIC AB 4-0 RB1 18 (SUTURE) ×3 IMPLANT
SYSTEM CHEST DRAIN TLS 7FR (DRAIN) IMPLANT

## 2015-02-28 NOTE — Anesthesia Postprocedure Evaluation (Signed)
Anesthesia Post Note  Patient: Hannah [name]  Procedure(s) Performed: Procedure(s) (LRB): THYROIDECTOMY (Right)  Patient location during evaluation: PACU Anesthesia Type: General Level of consciousness: awake Pain management: satisfactory to patient Respiratory status: nonlabored ventilation Cardiovascular status: stable Anesthetic complications: no    Last Vitals:  Filed Vitals:   02/28/15 0648 02/28/15 0855  BP: 145/82 134/56  Pulse: 85 112  Temp: 37.3 C 36.4 C  Resp: 14 14    Last Pain:  Filed Vitals:   02/28/15 0857  PainSc: 6                  VAN STAVEREN,Donielle Radziewicz

## 2015-02-28 NOTE — Progress Notes (Signed)
02/28/2015 1:05 PM   New Admission Note:   Arrival Method: bed from PACU Mental Orientation: A&O x4 Telemetry: continuous pulse oximetry Assessment: Completed Skin: intact, scattered bruises IV: 20g R forearm Pain: denies Tubes: Safety Measures: Safety Fall Prevention Plan has been given, discussed. Admission: Completed Unit Orientation: Patient has been orientated to the room, unit and staff.   Orders have been reviewed and implemented. Will continue to monitor the patient. Call light has been placed within reach and bed alarm has been activated.   Dola Argyle

## 2015-02-28 NOTE — Progress Notes (Signed)
..   02/28/2015 4:21 PM  Bernard, Hilda Blades [id number]  Post-Op Day 0    Temp:  [97.6 F (36.4 C)-99.1 F (37.3 C)] 98.5 F (36.9 C) (12/08 1158) Pulse Rate:  [85-115] 100 (12/08 1158) Resp:  [9-20] 11 (12/08 0954) BP: (126-145)/(56-82) 126/57 mmHg (12/08 1158) SpO2:  [94 %-100 %] 96 % (12/08 1158) Weight:  [68.04 kg (150 lb)] 68.04 kg (150 lb) (12/08 0648),     Intake/Output Summary (Last 24 hours) at 02/28/15 1621 Last data filed at 02/28/15 1526  Gross per 24 hour  Intake   1100 ml  Output    600 ml  Net    500 ml    No results found for this or any previous visit (from the past 24 hour(s)).  SUBJECTIVE:  No acute events.  Resting.  Reports pain slightly increased.  Slight amount of drainage from incision.  OBJECTIVE:   GEN- NAD, alert and oriented NECK-  Appropriately tender with mild edema, no hematoma or seroma  IMPRESSION:  S/p right hemithryoidectomy pod #0  PLAN:  Observe overnight due to continued pain.  Anticipate d/c in a.m.  Ansley Stanwood 02/28/2015, 4:21 PM

## 2015-02-28 NOTE — Anesthesia Procedure Notes (Signed)
Procedure Name: Intubation Date/Time: 02/28/2015 7:30 AM Performed by: Delaney Meigs Pre-anesthesia Checklist: Patient identified, Emergency Drugs available, Suction available, Patient being monitored and Timeout performed Patient Re-evaluated:Patient Re-evaluated prior to inductionOxygen Delivery Method: Circle system utilized Preoxygenation: Pre-oxygenation with 100% oxygen Intubation Type: IV induction Ventilation: Mask ventilation without difficulty Laryngoscope Size: Miller and 2 Grade View: Grade II Tube type: Oral Number of attempts: 1 Airway Equipment and Method: Stylet Placement Confirmation: ETT inserted through vocal cords under direct vision,  positive ETCO2 and breath sounds checked- equal and bilateral Secured at: 21 cm Tube secured with: Tape Dental Injury: Teeth and Oropharynx as per pre-operative assessment  Comments: Laryngeal electrode placed on ETT tube

## 2015-02-28 NOTE — Anesthesia Preprocedure Evaluation (Signed)
Anesthesia Evaluation  Patient identified by MRN, date of birth, ID band Patient awake    Reviewed: Allergy & Precautions, NPO status , Patient's Chart, lab work & pertinent test results  History of Anesthesia Complications Negative for: history of anesthetic complications  Airway Mallampati: II       Dental no notable dental hx.    Pulmonary neg pulmonary ROS,    breath sounds clear to auscultation       Cardiovascular Exercise Tolerance: Good hypertension,  Rhythm:Regular Rate:Normal     Neuro/Psych Anxiety Depression negative neurological ROS     GI/Hepatic Neg liver ROS, GERD  ,  Endo/Other  negative endocrine ROS  Renal/GU negative Renal ROS     Musculoskeletal negative musculoskeletal ROS (+)   Abdominal Normal abdominal exam  (+)   Peds  Hematology negative hematology ROS (+)   Anesthesia Other Findings   Reproductive/Obstetrics                             Anesthesia Physical Anesthesia Plan  ASA: II  Anesthesia Plan: General   Post-op Pain Management:    Induction: Intravenous  Airway Management Planned: Oral ETT  Additional Equipment:   Intra-op Plan:   Post-operative Plan: Extubation in OR  Informed Consent: I have reviewed the patients History and Physical, chart, labs and discussed the procedure including the risks, benefits and alternatives for the proposed anesthesia with the patient or authorized representative who has indicated his/her understanding and acceptance.     Plan Discussed with: CRNA  Anesthesia Plan Comments:         Anesthesia Quick Evaluation

## 2015-02-28 NOTE — Op Note (Signed)
..  02/28/2015  8:42 AM    Hannah [name], Hannah [name]  [id number]   Pre-Op Dx: THYROID NODULE, Dysphagia, Compressive symptoms  Post-op Dx: SAME  Proc: Right Hemithyroidectomy with laryngeal nerve monitoring  Surg: Trista Ciocca  Assistant:  Malon Kindle  Anes: GOT  EBL: <10ccs  Comp: None  Indications: Right sided thyroid nodule  Findings:Right recurrent laryngeal nerve identified and preserved, right superior and inferior parathyroid glands identified and preserved.  Large right sided superior thyroid nodule.  Moderate fibrosis to overlying musculature.  Description of Procedure: After the patient was identified in hold and the history and physical and consent was reviewed and updated. The patient was marked in an upright position anteriorly and also marked along the right side along a natural occuring skin crease. The patient was next taken to the operating room and placed in a supine position. General endotracheal anesthesia was induced with laryngeal monitor endotracheal tube.  Direct visualization of the tube electrodes in contact with the vocal cords was made.. The patient's anterior marked neck crease was neck injected with 7cc's of 1% lidocaine with 1:100,000 Epinephrine. The patient was next prepped and draped in a sterile normal fashion.  At this time, a 15 blade scalpel was used to make a skin incision along a previously marked anterior neck crease. Dissection was carefully performed through the subcutaneous tissues with combination of Bovie electrocautery and blunt dissection.  The platysma was incised and anterior neck veins ligated with harmonic scalpel.  The median raphe of the strap muscles was divided in a linear fashion with Bovie electrocautery until the anterior border of the thyroid gland was identified.  The sternohyoid muscle was bluntly dissected away from the large right hemithyroid gland.  The lateral border of the thyroid and the carotid artery was  identified.  Dissection bluntly and with Bovie electrocautery was continued superiorly and inferiorly along the lateral edge of the thyroid.  The superior vessels were identified laterally and then medially with blunt dissection in Joel's space between the larynx and the superior thyroid pole.  Further dissection was continued inferiorly as well.  Once the superior pole was pedicled, the superior thyroid vessels were ligated with Harmonic Scalpel.  The large right hemithyroid was delivered from the wound..  The large right thyroid nodule once delivered from the wound revealed Berry's ligament and the nodule of Zuckerkandl.  Just beneath this nodule, the recurrent laryngeal nerve was identified and stimulated with movement of the arytenoid joint.  The inferior parathyroid was identified adjacent to the nerve along with the superior parathyroid as well.  These were identified and revealed good vascularization.  The nerve was tracked until its insertion into the larynx.  Next, the remaining attachments of Berry's ligament was divided and the isthmus was divided with Harmonic Scalpel.  The wound was copiously irrigated with sterile saline. Meticulous hemostasis with bipolar was obtained.  Visualization of an intact right recurrent laryngeal nerve that stimulated was made.  The parathyroid glands were intact and well perfused. Surgicel was placed in the wound bed. The wound was then closed in a multilayered fashion with vicryl for subcutaneous tissues and locking running 6.0 suture for the skin closure.  This was topped with Bacitracin ointment.  At this time the patient was extubated and taken to PACU in good condition.  Plan: Follow pathology.  Limit activity for 2 weeks. Follow up next week for post-operative evaluation and suture removal.  Malcolm Quast  02/28/2015 8:42 AM

## 2015-02-28 NOTE — Transfer of Care (Signed)
Immediate Anesthesia Transfer of Care Note  Patient: Hannah [name]  Procedure(s) Performed: Procedure(s): THYROIDECTOMY (Right)  Patient Location: PACU  Anesthesia Type:General  Level of Consciousness: awake, alert  and oriented  Airway & Oxygen Therapy: Patient Spontanous Breathing and Patient connected to face mask oxygen  Post-op Assessment: Report given to RN and Post -op Vital signs reviewed and stable  Post vital signs: Reviewed and stable  Last Vitals: 08:56 100% sat 17resp 114hr 134/56 bp  Filed Vitals:   02/28/15 0648  BP: 145/82  Pulse: 85  Temp: 37.3 C  Resp: 14    Complications: No apparent anesthesia complications

## 2015-02-28 NOTE — H&P (Signed)
..  History and Physical paper copy reviewed and updated date of procedure and will be scanned into system.  

## 2015-03-01 DIAGNOSIS — D34 Benign neoplasm of thyroid gland: Secondary | ICD-10-CM | POA: Diagnosis not present

## 2015-03-01 LAB — SURGICAL PATHOLOGY

## 2015-03-01 MED ORDER — ONDANSETRON HCL 4 MG PO TABS: 4.0000 mg | ORAL_TABLET | ORAL | Status: DC | PRN

## 2015-03-01 MED ORDER — SENNOSIDES-DOCUSATE SODIUM 8.6-50 MG PO TABS: 1.0000 | ORAL_TABLET | Freq: Every evening | ORAL | Status: DC | PRN

## 2015-03-01 MED ORDER — HYDROCODONE-ACETAMINOPHEN 7.5-325 MG/15ML PO SOLN: 10.0000 mL | ORAL | Status: DC | PRN

## 2015-03-01 MED ORDER — BACITRACIN 500 UNIT/GM EX OINT: 1.0000 "application " | TOPICAL_OINTMENT | Freq: Three times a day (TID) | CUTANEOUS | Status: DC

## 2015-03-01 NOTE — Progress Notes (Signed)
Pt alert and oriented. Discharged to home concerns addressed. Prescriptions and discharge summary given to pt. IV site removed. Pt discharged via axillary.

## 2015-03-01 NOTE — Progress Notes (Addendum)
Patient A/O, no noted distress. Some pain noted, administered pain regimen effective (see EMar). Applied ice collar to surgical area, minimal drainage at beginning of shift. No drainage noted at end of shift. Skin within normal limits around surgical area. Patient tolerated meds well. Staff will continue to meet needs.

## 2015-03-01 NOTE — Progress Notes (Signed)
   03/01/15 0800  Clinical Encounter Type  Visited With Patient  Visit Type Initial  Referral From Nurse  Spiritual Encounters  Spiritual Needs Emotional  Stress Factors  Patient Stress Factors None identified  Chaplain Bryse Blanchette worked with patient as patient expressed her health situation.

## 2015-03-01 NOTE — Final Progress Note (Signed)
..   03/01/2015 7:43 AM  [name], [name] [id number]  Post-Op Day 1    Temp:  [97.6 F (36.4 C)-99.1 F (37.3 C)] 98.6 F (37 C) (12/09 0519) Pulse Rate:  [89-115] 93 (12/09 0519) Resp:  [9-20] 18 (12/09 0519) BP: (126-154)/(56-77) 154/72 mmHg (12/09 0519) SpO2:  [94 %-100 %] 97 % (12/09 0519),     Intake/Output Summary (Last 24 hours) at 03/01/15 0743 Last data filed at 03/01/15 0519  Gross per 24 hour  Intake 2857.92 ml  Output   2000 ml  Net 857.92 ml    No results found for this or any previous visit (from the past 24 hour(s)).  SUBJECTIVE:  No acute events.  Pain controlled with liquid medications.  Improved chronic lower leg pain.  Ambulating to bathroom.  Tolerating diet.  OBJECTIVE:   GEN- NAD, alert and oriented NECK-  Incision c/d/i with no drainage, mild bruising, no hematoma or seroma  IMPRESSION:  S/p right hemithryoidectomy POD#1  PLAN:  OK to discharge this a.m. If tolerates breakfast.  Please schedule follow up for Thursday 03/07/2015 p.m. For suture removal.  Hannah [name] 03/01/2015, 7:43 AM

## 2015-03-04 ENCOUNTER — Ambulatory Visit: Payer: Self-pay | Admitting: Pain Medicine

## 2015-03-07 ENCOUNTER — Encounter: Payer: Self-pay | Admitting: Psychiatry

## 2015-03-07 ENCOUNTER — Ambulatory Visit (INDEPENDENT_AMBULATORY_CARE_PROVIDER_SITE_OTHER): Payer: 59 | Admitting: Psychiatry

## 2015-03-07 VITALS — BP 124/76 | HR 77 | Temp 97.7°F | Resp 14 | Ht 60.0 in | Wt 152.0 lb

## 2015-03-07 DIAGNOSIS — F32A Depression, unspecified: Secondary | ICD-10-CM

## 2015-03-07 DIAGNOSIS — F329 Major depressive disorder, single episode, unspecified: Secondary | ICD-10-CM | POA: Diagnosis not present

## 2015-03-07 MED ORDER — CLONAZEPAM 0.5 MG PO TABS: 0.5000 mg | ORAL_TABLET | Freq: Two times a day (BID) | ORAL | Status: DC | PRN

## 2015-03-07 MED ORDER — PAROXETINE HCL 20 MG PO TABS
20.0000 mg | ORAL_TABLET | Freq: Every day | ORAL | Status: DC
Start: 2015-03-07 — End: 2015-04-08
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
## 2015-03-07 NOTE — Patient Instructions (Addendum)
You can use two of your paroxetine 10 mg  tablets daily. Once you get NEW PRESCRIPTION  it will be paroxetine 20 mg tablets so take one of those tablets daily.

## 2015-03-11 ENCOUNTER — Encounter: Payer: Self-pay | Admitting: Pain Medicine

## 2015-03-11 ENCOUNTER — Ambulatory Visit: Payer: 59 | Attending: Pain Medicine | Admitting: Pain Medicine

## 2015-03-11 DIAGNOSIS — M533 Sacrococcygeal disorders, not elsewhere classified: Secondary | ICD-10-CM

## 2015-03-11 DIAGNOSIS — M79605 Pain in left leg: Secondary | ICD-10-CM | POA: Diagnosis present

## 2015-03-11 DIAGNOSIS — M48062 Spinal stenosis, lumbar region with neurogenic claudication: Secondary | ICD-10-CM

## 2015-03-11 DIAGNOSIS — M79604 Pain in right leg: Secondary | ICD-10-CM

## 2015-03-11 DIAGNOSIS — R252 Cramp and spasm: Secondary | ICD-10-CM

## 2015-03-11 DIAGNOSIS — M79659 Pain in unspecified thigh: Secondary | ICD-10-CM

## 2015-03-11 DIAGNOSIS — G5790 Unspecified mononeuropathy of unspecified lower limb: Secondary | ICD-10-CM

## 2015-03-11 DIAGNOSIS — M545 Low back pain: Secondary | ICD-10-CM | POA: Insufficient documentation

## 2015-03-11 DIAGNOSIS — M5126 Other intervertebral disc displacement, lumbar region: Secondary | ICD-10-CM | POA: Insufficient documentation

## 2015-03-11 DIAGNOSIS — M47816 Spondylosis without myelopathy or radiculopathy, lumbar region: Secondary | ICD-10-CM | POA: Insufficient documentation

## 2015-03-11 DIAGNOSIS — M5136 Other intervertebral disc degeneration, lumbar region: Secondary | ICD-10-CM | POA: Insufficient documentation

## 2015-03-11 DIAGNOSIS — M4806 Spinal stenosis, lumbar region: Secondary | ICD-10-CM | POA: Insufficient documentation

## 2015-03-11 MED ORDER — MIDAZOLAM HCL 5 MG/5ML IJ SOLN
INTRAMUSCULAR | Status: AC
  Filled 2015-03-11: qty 5

## 2015-03-11 MED ORDER — HYDROCODONE-ACETAMINOPHEN 5-325 MG PO TABS: ORAL_TABLET | ORAL | Status: DC

## 2015-03-11 MED ORDER — ORPHENADRINE CITRATE 30 MG/ML IJ SOLN: 60.0000 mg | Freq: Once | INTRAMUSCULAR | Status: DC

## 2015-03-11 MED ORDER — LACTATED RINGERS IV SOLN: 1000.0000 mL | INTRAVENOUS | Status: DC

## 2015-03-11 MED ORDER — TRIAMCINOLONE ACETONIDE 40 MG/ML IJ SUSP
INTRAMUSCULAR | Status: AC
  Administered 2015-03-11: 10:00:00
  Filled 2015-03-11: qty 1

## 2015-03-11 MED ORDER — BUPIVACAINE HCL (PF) 0.25 % IJ SOLN
INTRAMUSCULAR | Status: AC
  Administered 2015-03-11: 10:00:00
  Filled 2015-03-11: qty 30

## 2015-03-11 MED ORDER — ORPHENADRINE CITRATE 30 MG/ML IJ SOLN
INTRAMUSCULAR | Status: AC
  Administered 2015-03-11: 10:00:00
  Filled 2015-03-11: qty 2

## 2015-03-11 MED ORDER — TRIAMCINOLONE ACETONIDE 40 MG/ML IJ SUSP: 40.0000 mg | Freq: Once | INTRAMUSCULAR | Status: DC

## 2015-03-11 MED ORDER — FENTANYL CITRATE (PF) 100 MCG/2ML IJ SOLN: 100.0000 ug | Freq: Once | INTRAMUSCULAR | Status: DC

## 2015-03-11 MED ORDER — FENTANYL CITRATE (PF) 100 MCG/2ML IJ SOLN
INTRAMUSCULAR | Status: AC
  Administered 2015-03-11: 100 ug via INTRAVENOUS
  Filled 2015-03-11: qty 2

## 2015-03-11 MED ORDER — MIDAZOLAM HCL 5 MG/5ML IJ SOLN
5.0000 mg | Freq: Once | INTRAMUSCULAR | Status: DC
Start: 2015-03-11 — End: 2016-09-01

## 2015-03-11 NOTE — Progress Notes (Signed)
Safety precautions to be maintained throughout the outpatient stay will include: orient to surroundings, keep bed in low position, maintain call bell within reach at all times, provide assistance with transfer out of bed and ambulation.  

## 2015-03-11 NOTE — Patient Instructions (Addendum)
PLAN   Continue present medication hydrocodone acetamino  F/U PCP Dr. Ronette Deter for evaliation of  BP and general medical  condition   F/U surgical evaluation. May consider pending follow-up evaluations  F/U neurological evaluation. May consider pending follow-up evaluations  F/U  psych evaluation  May consider radiofrequency rhizolysis or intraspinal procedures pending response to present treatment and F/U evaluation   Patient to call Pain Management Center should patient have concerns prior to scheduled return appointment.Pain Management Discharge Instructions  General Discharge Instructions :  If you need to reach your doctor call: Monday-Friday 8:00 am - 4:00 pm at [phone] or toll free [phone].  After clinic hours [phone] to have operator reach doctor.  Bring all of your medication bottles to all your appointments in the pain clinic.  To cancel or reschedule your appointment with Pain Management please remember to call 24 hours in advance to avoid a fee.  Refer to the educational materials which you have been given on: General Risks, I had my Procedure. Discharge Instructions, Post Sedation.  Post Procedure Instructions:  The drugs you were given will stay in your system until tomorrow, so for the next 24 hours you should not drive, make any legal decisions or drink any alcoholic beverages.  You may eat anything you prefer, but it is better to start with liquids then soups and crackers, and gradually work up to solid foods.  Please notify your doctor immediately if you have any unusual bleeding, trouble breathing or pain that is not related to your normal pain.  Depending on the type of procedure that was done, some parts of your body may feel week and/or numb.  This usually clears up by tonight or the next day.  Walk with the use of an assistive device or accompanied by an adult for the 24 hours.  You may use ice on the affected area for the first 24  hours.  Put ice in a Ziploc bag and cover with a towel and place against area 15 minutes on 15 minutes off.  You may switch to heat after 24 hours.GENERAL RISKS AND COMPLICATIONS  What are the risk, side effects and possible complications? Generally speaking, most procedures are safe.  However, with any procedure there are risks, side effects, and the possibility of complications.  The risks and complications are dependent upon the sites that are lesioned, or the type of nerve block to be performed.  The closer the procedure is to the spine, the more serious the risks are.  Great care is taken when placing the radio frequency needles, block needles or lesioning probes, but sometimes complications can occur. 1. Infection: Any time there is an injection through the skin, there is a risk of infection.  This is why sterile conditions are used for these blocks.  There are four possible types of infection. 1. Localized skin infection. 2. Central Nervous System Infection-This can be in the form of Meningitis, which can be deadly. 3. Epidural Infections-This can be in the form of an epidural abscess, which can cause pressure inside of the spine, causing compression of the spinal cord with subsequent paralysis. This would require an emergency surgery to decompress, and there are no guarantees that the patient would recover from the paralysis. 4. Discitis-This is an infection of the intervertebral discs.  It occurs in about 1% of discography procedures.  It is difficult to treat and it may lead to surgery.        2. Pain: the needles have  to go through skin and soft tissues, will cause soreness.       3. Damage to internal structures:  The nerves to be lesioned may be near blood vessels or    other nerves which can be potentially damaged.       4. Bleeding: Bleeding is more common if the patient is taking blood thinners such as  aspirin, Coumadin, Ticiid, Plavix, etc., or if he/she have some genetic  predisposition  such as hemophilia. Bleeding into the spinal canal can cause compression of the spinal  cord with subsequent paralysis.  This would require an emergency surgery to  decompress and there are no guarantees that the patient would recover from the  paralysis.       5. Pneumothorax:  Puncturing of a lung is a possibility, every time a needle is introduced in  the area of the chest or upper back.  Pneumothorax refers to free air around the  collapsed lung(s), inside of the thoracic cavity (chest cavity).  Another two possible  complications related to a similar event would include: Hemothorax and Chylothorax.   These are variations of the Pneumothorax, where instead of air around the collapsed  lung(s), you may have blood or chyle, respectively.       6. Spinal headaches: They may occur with any procedures in the area of the spine.       7. Persistent CSF (Cerebro-Spinal Fluid) leakage: This is a rare problem, but may occur  with prolonged intrathecal or epidural catheters either due to the formation of a fistulous  track or a dural tear.       8. Nerve damage: By working so close to the spinal cord, there is always a possibility of  nerve damage, which could be as serious as a permanent spinal cord injury with  paralysis.       9. Death:  Although rare, severe deadly allergic reactions known as "Anaphylactic  reaction" can occur to any of the medications used.      10. Worsening of the symptoms:  We can always make thing worse.  What are the chances of something like this happening? Chances of any of this occuring are extremely low.  By statistics, you have more of a chance of getting killed in a motor vehicle accident: while driving to the hospital than any of the above occurring .  Nevertheless, you should be aware that they are possibilities.  In general, it is similar to taking a shower.  Everybody knows that you can slip, hit your head and get killed.  Does that mean that you should not  shower again?  Nevertheless always keep in mind that statistics do not mean anything if you happen to be on the wrong side of them.  Even if a procedure has a 1 (one) in a 1,000,000 (million) chance of going wrong, it you happen to be that one..Also, keep in mind that by statistics, you have more of a chance of having something go wrong when taking medications.  Who should not have this procedure? If you are on a blood thinning medication (e.g. Coumadin, Plavix, see list of "Blood Thinners"), or if you have an active infection going on, you should not have the procedure.  If you are taking any blood thinners, please inform your physician.  How should I prepare for this procedure?  Do not eat or drink anything at least six hours prior to the procedure.  Bring a driver with you .  It  cannot be a taxi.  Come accompanied by an adult that can drive you back, and that is strong enough to help you if your legs get weak or numb from the local anesthetic.  Take all of your medicines the morning of the procedure with just enough water to swallow them.  If you have diabetes, make sure that you are scheduled to have your procedure done first thing in the morning, whenever possible.  If you have diabetes, take only half of your insulin dose and notify our nurse that you have done so as soon as you arrive at the clinic.  If you are diabetic, but only take blood sugar pills (oral hypoglycemic), then do not take them on the morning of your procedure.  You may take them after you have had the procedure.  Do not take aspirin or any aspirin-containing medications, at least eleven (11) days prior to the procedure.  They may prolong bleeding.  Wear loose fitting clothing that may be easy to take off and that you would not mind if it got stained with Betadine or blood.  Do not wear any jewelry or perfume  Remove any nail coloring.  It will interfere with some of our monitoring equipment.  NOTE: Remember that  this is not meant to be interpreted as a complete list of all possible complications.  Unforeseen problems may occur.  BLOOD THINNERS The following drugs contain aspirin or other products, which can cause increased bleeding during surgery and should not be taken for 2 weeks prior to and 1 week after surgery.  If you should need take something for relief of minor pain, you may take acetaminophen which is found in Tylenol,m Datril, Anacin-3 and Panadol. It is not blood thinner. The products listed below are.  Do not take any of the products listed below in addition to any listed on your instruction sheet.  A.P.C or A.P.C with Codeine Codeine Phosphate Capsules #3 Ibuprofen Ridaura  ABC compound Congesprin Imuran rimadil  Advil Cope Indocin Robaxisal  Alka-Seltzer Effervescent Pain Reliever and Antacid Coricidin or Coricidin-D  Indomethacin Rufen  Alka-Seltzer plus Cold Medicine Cosprin Ketoprofen S-A-C Tablets  Anacin Analgesic Tablets or Capsules Coumadin Korlgesic Salflex  Anacin Extra Strength Analgesic tablets or capsules CP-2 Tablets Lanoril Salicylate  Anaprox Cuprimine Capsules Levenox Salocol  Anexsia-D Dalteparin Magan Salsalate  Anodynos Darvon compound Magnesium Salicylate Sine-off  Ansaid Dasin Capsules Magsal Sodium Salicylate  Anturane Depen Capsules Marnal Soma  APF Arthritis pain formula Dewitt's Pills Measurin Stanback  Argesic Dia-Gesic Meclofenamic Sulfinpyrazone  Arthritis Bayer Timed Release Aspirin Diclofenac Meclomen Sulindac  Arthritis pain formula Anacin Dicumarol Medipren Supac  Analgesic (Safety coated) Arthralgen Diffunasal Mefanamic Suprofen  Arthritis Strength Bufferin Dihydrocodeine Mepro Compound Suprol  Arthropan liquid Dopirydamole Methcarbomol with Aspirin Synalgos  ASA tablets/Enseals Disalcid Micrainin Tagament  Ascriptin Doan's Midol Talwin  Ascriptin A/D Dolene Mobidin Tanderil  Ascriptin Extra Strength Dolobid Moblgesic Ticlid  Ascriptin with Codeine  Doloprin or Doloprin with Codeine Momentum Tolectin  Asperbuf Duoprin Mono-gesic Trendar  Aspergum Duradyne Motrin or Motrin IB Triminicin  Aspirin plain, buffered or enteric coated Durasal Myochrisine Trigesic  Aspirin Suppositories Easprin Nalfon Trillsate  Aspirin with Codeine Ecotrin Regular or Extra Strength Naprosyn Uracel  Atromid-S Efficin Naproxen Ursinus  Auranofin Capsules Elmiron Neocylate Vanquish  Axotal Emagrin Norgesic Verin  Azathioprine Empirin or Empirin with Codeine Normiflo Vitamin E  Azolid Emprazil Nuprin Voltaren  Bayer Aspirin plain, buffered or children's or timed BC Tablets or powders Encaprin Orgaran Warfarin Sodium  Buff-a-Comp Enoxaparin Orudis Zorpin  Buff-a-Comp with Codeine Equegesic Os-Cal-Gesic   Buffaprin Excedrin plain, buffered or Extra Strength Oxalid   Bufferin Arthritis Strength Feldene Oxphenbutazone   Bufferin plain or Extra Strength Feldene Capsules Oxycodone with Aspirin   Bufferin with Codeine Fenoprofen Fenoprofen Pabalate or Pabalate-SF   Buffets II Flogesic Panagesic   Buffinol plain or Extra Strength Florinal or Florinal with Codeine Panwarfarin   Buf-Tabs Flurbiprofen Penicillamine   Butalbital Compound Four-way cold tablets Penicillin   Butazolidin Fragmin Pepto-Bismol   Carbenicillin Geminisyn Percodan   Carna Arthritis Reliever Geopen Persantine   Carprofen Gold's salt Persistin   Chloramphenicol Goody's Phenylbutazone   Chloromycetin Haltrain Piroxlcam   Clmetidine heparin Plaquenil   Cllnoril Hyco-pap Ponstel   Clofibrate Hydroxy chloroquine Propoxyphen         Before stopping any of these medications, be sure to consult the physician who ordered them.  Some, such as Coumadin (Warfarin) are ordered to prevent or treat serious conditions such as "deep thrombosis", "pumonary embolisms", and other heart problems.  The amount of time that you may need off of the medication may also vary with the medication and the reason for which  you were taking it.  If you are taking any of these medications, please make sure you notify your pain physician before you undergo any procedures.  Hydrocodone prescription given

## 2015-03-11 NOTE — Progress Notes (Signed)
Subjective:    Patient ID: Hannah [name], female    DOB: Jul 06, 1950, 64 y.o.   MRN: [id number]  HPI  PROCEDURE PERFORMED: Lumbosacral selective nerve root block   NOTE: The patient is a 64 y.o. female who returns to [place] for further evaluation and treatment of pain involving the lumbar and lower extremity region. Studies consisting of  MRI has revealed the patient to be with evidence of Degenerative disc disease lumbar spine Kyphotic curvature at the 1112 disc space, spondylosis and shallow disc bulge at this level resulting in mild canal and moderate bilateral foraminal stenosis and mild anterior and superior T12 upper endplate depression, lack of marrow edema indicative of chronic change with mild osseous and disc changes at the remaining levels with adrenal nodule. Disc desiccation central right with the central disc bulge resulting and right foraminal narrowing L3-4 level with moderate left and right facet arthrosis. L4-L5 disc desiccation. Shallow broad-based disc bulging and narrowing of the central canal and left foramen at L4-L5 level. There is concern regarding intraspinal abnormalities contributing to the patient's symptomatology. The risks, benefits, and expectations of the procedure have been explained to the patient who was understanding and in agreement with suggested treatment plan. We will proceed with interventional treatment as discussed and as explained to the patient. The patient is understanding and in agreement with suggested treatment plan.   DESCRIPTION OF PROCEDURE: Right Lumbosacral selective nerve root block with IV Versed, IV fentanyl conscious sedation, EKG, blood pressure, pulse, and pulse oximetry monitoring. The procedure was performed with the patient in the prone position under fluoroscopic guidance. With the patient in the prone position, Betadine prep of proposed entry site was performed. Local anesthetic skin wheal of proposed needle entry site  was prepared with 1.5% plain lidocaine with AP view of the lumbosacral spine.   PROCEDURE #1: Needle placement at the  righ L2 vertebral body: A 22 -gauge needle was inserted at the inferior border of the transverse process of the vertebral body with needle placed medial to the midline of the transverse process on AP view of the lumbosacral spine.   NEEDLE PLACEMENT AT   L3, L4, and L5  VERTEBRAL BODY LEVELS  Needle  placement was accomplished at  L3,L4 ,and L5  vertebral body levels on the  righ side exactly as was accomplished at the  L2  vertebral body level  and utilizing the same technique and under fluoroscopic guidance.   Needle placement was then verified on lateral view at all levels with needle tip documented to be in the posterior superior quadrant of the intervertebral foramen of  L  2, L3, L4, and L5. Following negative aspiration for heme and CSF at each level, each level was injected with 3 mL of 0.25% bupivacaine with Kenalog.       The patient tolerated the procedure well. A total of 10 mg of Kenalog was utilized for the procedure.   PLAN:  1. Medications: Will continue presently prescribed medication 2. The patient is to undergo follow-up evaluation with PCP for evaluation of blood pressure and general medical condition status post procedure performed on today's visit. 3. Surgical follow-up evaluation. 4. Neurological evaluation. 5. May consider radiofrequency procedures, implantation type procedures and other treatment pending response to treatment and follow-up evaluation. 6. The patient has been advise do adhere to proper body mechanics and avoid activities which may aggravate condition. 7. The patient has been advised to call the [facility] prior to scheduled  return appointment should there be significant change in the patient's condition or should the patient have other concerns regarding condition prior to scheduled return appointment.   Review of  Systems     Objective:   Physical Exam        Assessment & Plan:

## 2015-03-12 ENCOUNTER — Telehealth: Payer: Self-pay

## 2015-03-12 ENCOUNTER — Encounter: Payer: 59 | Admitting: Pain Medicine

## 2015-03-12 NOTE — Telephone Encounter (Signed)
Left voice message.

## 2015-03-13 ENCOUNTER — Encounter: Payer: Self-pay | Admitting: *Deleted

## 2015-03-13 ENCOUNTER — Emergency Department
Admission: EM | Admit: 2015-03-13 | Discharge: 2015-03-13 | Disposition: A | Discharge status: Home / Self Care | Payer: 59 | Attending: Emergency Medicine | Admitting: Emergency Medicine

## 2015-03-13 ENCOUNTER — Telehealth: Payer: Self-pay

## 2015-03-13 DIAGNOSIS — Z792 Long term (current) use of antibiotics: Secondary | ICD-10-CM | POA: Diagnosis not present

## 2015-03-13 DIAGNOSIS — G8929 Other chronic pain: Secondary | ICD-10-CM | POA: Insufficient documentation

## 2015-03-13 DIAGNOSIS — I10 Essential (primary) hypertension: Secondary | ICD-10-CM | POA: Diagnosis not present

## 2015-03-13 DIAGNOSIS — Z79899 Other long term (current) drug therapy: Secondary | ICD-10-CM | POA: Diagnosis not present

## 2015-03-13 DIAGNOSIS — M545 Low back pain: Secondary | ICD-10-CM | POA: Diagnosis present

## 2015-03-13 DIAGNOSIS — M542 Cervicalgia: Secondary | ICD-10-CM | POA: Insufficient documentation

## 2015-03-13 DIAGNOSIS — M5441 Lumbago with sciatica, right side: Secondary | ICD-10-CM | POA: Insufficient documentation

## 2015-03-13 MED ORDER — OXYCODONE-ACETAMINOPHEN 5-325 MG PO TABS
1.0000 | ORAL_TABLET | Freq: Once | ORAL | Status: AC
  Administered 2015-03-13: 1 via ORAL
  Filled 2015-03-13: qty 1

## 2015-03-13 MED ORDER — OXYCODONE-ACETAMINOPHEN 5-325 MG PO TABS: 1.0000 | ORAL_TABLET | Freq: Four times a day (QID) | ORAL | Status: DC | PRN

## 2015-03-13 MED ORDER — ONDANSETRON 4 MG PO TBDP
4.0000 mg | ORAL_TABLET | Freq: Once | ORAL | Status: AC
Start: 2015-03-13 — End: 2015-03-13
  Administered 2015-03-13: 4 mg via ORAL
  Filled 2015-03-13: qty 1

## 2015-03-13 MED ORDER — HYDROMORPHONE HCL 1 MG/ML IJ SOLN
1.0000 mg | Freq: Once | INTRAMUSCULAR | Status: AC
  Administered 2015-03-13: 1 mg via INTRAMUSCULAR
  Filled 2015-03-13: qty 1

## 2015-03-13 NOTE — Telephone Encounter (Signed)
Had procedure on 03-11-15.Having "severe " pain in right leg, groin, hip. Pain goes down to ankle. Sometimes cannot walk due to leg pain. Did not use ice/heat. No loss of control of bowel/bladder. Unsure if has fever, has not checked it.

## 2015-03-13 NOTE — Discharge Instructions (Signed)
As we discussed please follow-up with Dr. crisp as soon as possible regarding her continued/worsening back pain. Please return to the emergency department immediately for any fever, incontinence, or numbness of either leg. Please also follow-up with your primary care physician for ongoing continuity of care.   Chronic Back Pain  When back pain lasts longer than 3 months, it is called chronic back pain.People with chronic back pain often go through certain periods that are more intense (flare-ups).  CAUSES Chronic back pain can be caused by wear and tear (degeneration) on different structures in your back. These structures include:  The bones of your spine (vertebrae) and the joints surrounding your spinal cord and nerve roots (facets).  The strong, fibrous tissues that connect your vertebrae (ligaments). Degeneration of these structures may result in pressure on your nerves. This can lead to constant pain. HOME CARE INSTRUCTIONS  Avoid bending, heavy lifting, prolonged sitting, and activities which make the problem worse.  Take brief periods of rest throughout the day to reduce your pain. Lying down or standing usually is better than sitting while you are resting.  Take over-the-counter or prescription medicines only as directed by your caregiver. SEEK IMMEDIATE MEDICAL CARE IF:   You have weakness or numbness in one of your legs or feet.  You have trouble controlling your bladder or bowels.  You have nausea, vomiting, abdominal pain, shortness of breath, or fainting.   This information is not intended to replace advice given to you by your health care provider. Make sure you discuss any questions you have with your health care provider.   Document Released: 04/16/2004 Document Revised: 06/01/2011 Document Reviewed: 08/27/2014 Elsevier Interactive Patient Education [employer].

## 2015-03-13 NOTE — ED Notes (Signed)
Per EMS and patient's report, patient has hx. Of low back pain from disc problems, and after having an MRI, went to pain clinic and received a nerve block in the lower back on 12/18. Patient states since then the back pain has increased, radiates to right groin and down right leg. Patient reports decreased mobility.

## 2015-03-13 NOTE — ED Notes (Signed)
NAD noted at time of D/C. Pt taken to lobby via wheelchair. Pt denies comments/concerns at this time.  

## 2015-03-13 NOTE — ED Notes (Signed)
Pt states she is nauseous and feels like she is going pass out. MD notified at this time. Pt states she is anxious about going home and being in pain. Asks if she should take a Klonopin, to which this RN replied that she should not.

## 2015-03-13 NOTE — ED Provider Notes (Signed)
[facility] Emergency Department Provider Note  Time seen: 12:25 PM  I have reviewed the triage vital signs and the nursing notes.   HISTORY  Chief Complaint Back Pain    HPI Hannah [name] is a 64 y.o. female with a past medical history of gastric reflux, hypertension, hyperlipidemia, depression, chronic back pain who presents the emergency department with lower back pain. According to the patient for the past several weeks she has had progressively worsening lower back pain. She has been following up with Dr. crisp, who most recently attempted a nerve block 3 days ago. She states since they've attempted nerve block she has had continued pain. She called his office today, and he told her to come to the emergency department for pain relief. Patient denies any incontinence. Denies any numbness. Her main concern is lower back pain radiating down her right side which is typical for her back pain, but has progressively worsened. Patient is taking hydrocodone at home without relief. Denies fever. Denies dysuria.     Past Medical History  Diagnosis Date  . GERD (gastroesophageal reflux disease)   . Degenerative joint disease of spine   . Hypertension   . Hyperlipidemia   . Depression     Followed in past by Dr. Randel Books  . Hypercholesteremia   . Heart murmur   . Carotid artery occlusion     Patient Active Problem List   Diagnosis Date Noted  . S/P partial thyroidectomy 02/28/2015  . Preop cardiovascular exam 02/22/2015  . Preop examination 02/19/2015  . Nonspecific abnormal electrocardiogram (ECG) (EKG) 02/19/2015  . Depression 01/28/2015  . Multiple thyroid nodules 12/06/2014  . Diarrhea 12/06/2014  . Spinal stenosis, lumbar region, with neurogenic claudication 11/22/2014  . Sacroiliac joint dysfunction 11/22/2014  . DDD (degenerative disc disease), lumbar 09/04/2014  . Facet syndrome, lumbar 09/04/2014  . Muscle cramps 07/15/2014  . Right leg pain  02/08/2014  . Gait instability 07/14/2013  . Tinnitus of both ears 07/14/2013  . Anxiety state, unspecified 04/15/2013  . Essential hypertension, benign 10/31/2012  . Thigh pain 10/31/2012    Past Surgical History  Procedure Laterality Date  . Abdominal hysterectomy    . Cholecystectomy    . Appendectomy    . Vaginal delivery    . Tonsillectomy    . Adenoidectomy    . Broken wrist  Left   . Fracture surgery Left   . Thyroidectomy Right 02/28/2015    Procedure: THYROIDECTOMY;  Surgeon: Carloyn Manner, MD;  Location: [facility] ORS;  Service: ENT;  Laterality: Right;    Current Outpatient Rx  Name  Route  Sig  Dispense  Refill  . amLODipine (NORVASC) 5 MG tablet   Oral   Take 1 tablet (5 mg total) by mouth daily.   90 tablet   3   . bacitracin 500 UNIT/GM ointment   Topical   Apply 1 application topically every 8 (eight) hours.   15 g   0   . clonazePAM (KLONOPIN) 0.5 MG tablet   Oral   Take 1 tablet (0.5 mg total) by mouth 2 (two) times daily as needed for anxiety.   60 tablet   1   . cyclobenzaprine (FLEXERIL) 10 MG tablet   Oral   Take 1 tablet (10 mg total) by mouth 3 (three) times daily as needed for muscle spasms.   30 tablet   0   . gabapentin (NEURONTIN) 300 MG capsule   Oral   Take 2 capsules (600 mg  total) by mouth 2 (two) times daily.   180 capsule   3   . HYDROcodone-acetaminophen (NORCO/VICODIN) 5-325 MG tablet      Limit 1 tab by mouth per day or twice per day if tolerated   50 tablet   0   . hyoscyamine (LEVSIN SL) 0.125 MG SL tablet      DISSOLVE ONE TABLET IN MOUTH EVERY 4 HOURS AS NEEDED   30 tablet   0   . omeprazole (PRILOSEC) 40 MG capsule      TAKE ONE CAPSULE BY MOUTH ONCE DAILY   30 capsule   5   . ondansetron (ZOFRAN) 4 MG tablet   Oral   Take 1 tablet (4 mg total) by mouth every 4 (four) hours as needed for nausea.   20 tablet   0   . PARoxetine (PAXIL) 20 MG tablet   Oral   Take 1 tablet (20 mg total) by mouth  daily.   30 tablet   1   . senna-docusate (SENOKOT-S) 8.6-50 MG tablet   Oral   Take 1 tablet by mouth at bedtime as needed for mild constipation.   45 tablet   0     Allergies No known allergies  Family History  Problem Relation Age of Onset  . Hypertension Mother   . Hyperlipidemia Mother   . Heart disease Mother   . Cancer Sister     breast    Social History Social History  Substance Use Topics  . Smoking status: Never Smoker   . Smokeless tobacco: Never Used  . Alcohol Use: No    Review of Systems Constitutional: Negative for fever. Cardiovascular: Negative for chest pain. Respiratory: Negative for shortness of breath. Gastrointestinal: Negative for abdominal pain Genitourinary: Negative for dysuria. Negative for incontinence. Musculoskeletal: Negative for back pain. Neurological: Negative for headache 10-point ROS otherwise negative.  ____________________________________________   PHYSICAL EXAM:  VITAL SIGNS: ED Triage Vitals  Enc Vitals Group     BP 03/13/15 1204 145/67 mmHg     Pulse Rate 03/13/15 1204 84     Resp --      Temp 03/13/15 1204 98.3 F (36.8 C)     Temp Source 03/13/15 1204 Oral     SpO2 03/13/15 1204 97 %     Weight 03/13/15 1157 152 lb (68.947 kg)     Height 03/13/15 1157 5' (1.524 m)     Head Cir --      Peak Flow --      Pain Score 03/13/15 1158 5     Pain Loc --      Pain Edu? --      Excl. in [place]? --     Constitutional: Alert and oriented. Well appearing and in no distress. Eyes: Normal exam ENT   Head: Normocephalic and atraumatic.   Mouth/Throat: Mucous membranes are moist. Cardiovascular: Normal rate, regular rhythm. No murmur Respiratory: Normal respiratory effort without tachypnea nor retractions. Breath sounds are clear and equal bilaterally. No wheezes/rales/rhonchi. Gastrointestinal: Soft and nontender. No distention.   Musculoskeletal: Nontender lower extremities, without edema. Positive moderate lower  back tenderness to palpation, mild neck tenderness to palpation. Neurologic:  Normal speech and language. No gross focal neurologic deficits. Skin:  Skin is warm, dry and intact.  Psychiatric: Mood and affect are normal. Speech and behavior are normal.  ____________________________________________    INITIAL IMPRESSION / ASSESSMENT AND PLAN / ED COURSE  Pertinent labs & imaging results that were available during my care  of the patient were reviewed by me and considered in my medical decision making (see chart for details).  Patient presents to the emergency department with progressively worsening lower back pain over the past several weeks, much worse over the past 3-4 days. Patient saw Dr. crisp for the worsening pain to attempted a nerve block, patient states no pain relief with the nerve block. She called Dr. Ethel Rana office today who recommended she come to the ER for evaluation. Patient is able to move her lower extremities although with pain in her back. No numbness, no incontinence. At this time I do not believe the patient warrants an MRI. Denies any recent trauma. I discussed with the patient and child of prednisone, we'll control pain in the emergency department with Dilaudid, and discharged with oxycodone. Patient is to follow-up with Dr. crisp by calling his office this afternoon to arrange a follow-up appointment. I discussed very strict return precautions with the patient for any fever, numbness, incontinence issues. Patient is agreeable to this plan. ____________________________________________   FINAL CLINICAL IMPRESSION(S) / ED DIAGNOSES  Lower back pain   Harvest Dark, MD 03/13/15 1230

## 2015-03-13 NOTE — Telephone Encounter (Signed)
Called and left voicemail with patient re; Dr Ethel Rana advice.

## 2015-03-13 NOTE — Telephone Encounter (Signed)
Pt niece called I forgot to get her name she stated the pt is in a lot of pain pt had procedure done on Monday and has yet been able to walk due to weakness of the legs and pain.. Pt and niece would like to know what does she need to do.. Should she go to the ER or schedule an appointment with Dr. Primus Bravo

## 2015-03-13 NOTE — Telephone Encounter (Signed)
Nurses Have patient go to the emergency room for evaluation. Patient is status post lumbosacral selective nerve root block May need to have MRI of the lumbar region Patient could have herniated nucleus pulposus all other reasons for nerve impingement or stenosis Have patient go to the emergency room now and have emergency room physician call me to discuss patient

## 2015-04-08 ENCOUNTER — Ambulatory Visit (INDEPENDENT_AMBULATORY_CARE_PROVIDER_SITE_OTHER): Payer: BLUE CROSS/BLUE SHIELD | Admitting: Psychiatry

## 2015-04-08 ENCOUNTER — Encounter: Payer: Self-pay | Admitting: Psychiatry

## 2015-04-08 VITALS — BP 110/68 | HR 98 | Temp 98.4°F | Ht 60.0 in | Wt 148.2 lb

## 2015-04-08 DIAGNOSIS — F329 Major depressive disorder, single episode, unspecified: Secondary | ICD-10-CM | POA: Diagnosis not present

## 2015-04-08 DIAGNOSIS — F32A Depression, unspecified: Secondary | ICD-10-CM

## 2015-04-08 MED ORDER — CLONAZEPAM 0.5 MG PO TABS: 0.5000 mg | ORAL_TABLET | Freq: Two times a day (BID) | ORAL | Status: DC | PRN

## 2015-04-08 MED ORDER — HYDROXYZINE PAMOATE 50 MG PO CAPS: ORAL_CAPSULE | ORAL | Status: DC

## 2015-04-08 MED ORDER — PAROXETINE HCL 20 MG PO TABS: 20.0000 mg | ORAL_TABLET | Freq: Every day | ORAL | Status: DC

## 2015-04-08 NOTE — Progress Notes (Signed)
[facility] MD/PA/NP OP Progress Note  04/08/2015 1:07 PM Hannah [name]  MRN:  [id number]  Subjective:  Patient returns for follow-up of her panic disorder with agoraphobia and major depressive disorder recurrent moderate. At the last visit we had increased her Paxil which was started by her primary care doctor from 10 mg to 20 mg. In addition we prescribed clonazepam 0.5 mg twice a day as needed for anxiety. Patient states she might be using the clonazepam about 6 times a week. She states it is been effective for her panic attacks. She states it does take about 30 minutes for her to feel benefit from the medication. She feels like the Paxil has also been helpful. She is denying any side effects from these medicines. She gets only issue right now is difficulty sleeping. She states that she does not nap. We reviewed sleep hygiene. She states that she typically just wakes up and is on edge at night. I discussed perhaps sleep medications and she indicated she had had good benefit from Vistaril in the past. She states she does not come out of her room much but this is due to her not wanting to be caught up in the stressors of the people she lives with (i.e. her nephew and his wife). Chief Complaint: Sleep Chief Complaint    Medication Refill; Follow-up     Visit Diagnosis:     ICD-9-CM ICD-10-CM   1. Depression 311 F32.9 PARoxetine (PAXIL) 20 MG tablet    Past Medical History:  Past Medical History  Diagnosis Date  . GERD (gastroesophageal reflux disease)   . Degenerative joint disease of spine   . Hypertension   . Hyperlipidemia   . Depression     Followed in past by Dr. Randel Books  . Hypercholesteremia   . Heart murmur   . Carotid artery occlusion   . Thyroid disease     Past Surgical History  Procedure Laterality Date  . Abdominal hysterectomy    . Cholecystectomy    . Appendectomy    . Vaginal delivery    . Tonsillectomy    . Adenoidectomy    . Broken wrist  Left   . Fracture surgery Left    . Thyroidectomy Right 02/28/2015    Procedure: THYROIDECTOMY;  Surgeon: Carloyn Manner, MD;  Location: [facility] ORS;  Service: ENT;  Laterality: Right;   Family History:  Family History  Problem Relation Age of Onset  . Hypertension Mother   . Hyperlipidemia Mother   . Heart disease Mother   . Anxiety disorder Mother   . Drug abuse Mother   . Cancer Sister     breast  . Social phobia Sister   . Alcohol abuse Father   . Anxiety disorder Father   . Drug abuse Father   . Drug abuse Sister   . Anxiety disorder Sister   . Alcohol abuse Sister   . Breast cancer Sister   . Bipolar disorder Sister    Social History:  Social History   Social History  . Marital Status: Widowed    Spouse Name: N/A  . Number of Children: N/A  . Years of Education: N/A   Social History Main Topics  . Smoking status: Never Smoker   . Smokeless tobacco: Never Used  . Alcohol Use: No  . Drug Use: No  . Sexual Activity: Not Currently   Other Topics Concern  . None   Social History Narrative   Lives with nephew in [place]. Helps with care  of 2 grand-nephews.   Husband deceased.      Work - previously as Theatre manager History:   Assessment:   Musculoskeletal: Strength & Muscle Tone: within normal limits Gait & Station: normal Patient leans: N/A  Psychiatric Specialty Exam: HPI  Review of Systems  Psychiatric/Behavioral: Negative for depression, suicidal ideas, hallucinations, memory loss and substance abuse. The patient is nervous/anxious (states it's responding well to the current medication regimen but she does occasionally have panic attacks) and has insomnia.     Blood pressure 110/68, pulse 98, temperature 98.4 F (36.9 C), temperature source Tympanic, height 5' (1.524 m), weight 148 lb 3.2 oz (67.223 kg), SpO2 92 %.Body mass index is 28.94 kg/(m^2).  General Appearance: Well Groomed  Eye Contact:  Good  Speech:  Normal Rate  Volume:  Normal  Mood:  All  right  Affect:  Constricted  Thought Process:  Linear and Logical  Orientation:  Full (Time, Place, and Person)  Thought Content:  Negative  Suicidal Thoughts:  No  Homicidal Thoughts:  No  Memory:  Immediate;   Good Recent;   Good Remote;   Good  Judgement:  Good  Insight:  NA  Psychomotor Activity:  Negative  Concentration:  Good  Recall:  Good  Fund of Knowledge: Good  Language: Good  Akathisia:  Negative  Handed:   AIMS (if indicated):    Assets:  Communication Skills Desire for Improvement Social Support  ADL's:  Intact  Cognition: WNL  Sleep:  Or 3-4 hours a night, frequent wakening    Is the patient at risk to self?  No. Has the patient been a risk to self in the past 6 months?  No. Has the patient been a risk to self within the distant past?  No. Is the patient a risk to others?  No. Has the patient been a risk to others in the past 6 months?  No. Has the patient been a risk to others within the distant past?  No.  Current Medications: Current Outpatient Prescriptions  Medication Sig Dispense Refill  . amLODipine (NORVASC) 5 MG tablet Take 1 tablet (5 mg total) by mouth daily. 90 tablet 3  . bacitracin 500 UNIT/GM ointment Apply 1 application topically every 8 (eight) hours. 15 g 0  . clonazePAM (KLONOPIN) 0.5 MG tablet Take 1 tablet (0.5 mg total) by mouth 2 (two) times daily as needed for anxiety. 60 tablet 4  . cyclobenzaprine (FLEXERIL) 10 MG tablet Take 1 tablet (10 mg total) by mouth 3 (three) times daily as needed for muscle spasms. 30 tablet 0  . gabapentin (NEURONTIN) 300 MG capsule Take 2 capsules (600 mg total) by mouth 2 (two) times daily. 180 capsule 3  . HYDROcodone-acetaminophen (NORCO/VICODIN) 5-325 MG tablet Limit 1 tab by mouth per day or twice per day if tolerated 50 tablet 0  . hyoscyamine (LEVSIN SL) 0.125 MG SL tablet DISSOLVE ONE TABLET IN MOUTH EVERY 4 HOURS AS NEEDED 30 tablet 0  . omeprazole (PRILOSEC) 40 MG capsule TAKE ONE CAPSULE BY  MOUTH ONCE DAILY 30 capsule 5  . ondansetron (ZOFRAN) 4 MG tablet Take 1 tablet (4 mg total) by mouth every 4 (four) hours as needed for nausea. 20 tablet 0  . oxyCODONE-acetaminophen (ROXICET) 5-325 MG tablet Take 1 tablet by mouth every 6 (six) hours as needed. 20 tablet 0  . PARoxetine (PAXIL) 20 MG tablet Take 1 tablet (20 mg total) by mouth daily. 30 tablet 4  . polyethylene  glycol powder (GLYCOLAX/MIRALAX) powder Take by mouth.    . senna-docusate (SENOKOT-S) 8.6-50 MG tablet Take 1 tablet by mouth at bedtime as needed for mild constipation. 45 tablet 0  . hydrOXYzine (VISTARIL) 50 MG capsule Take one to two capsules at bedtime as need for sleep. 60 capsule 4   Current Facility-Administered Medications  Medication Dose Route Frequency Provider Last Rate Last Dose  . fentaNYL (SUBLIMAZE) injection 100 mcg  100 mcg Intravenous Once Mohammed Kindle, MD      . lactated ringers infusion 1,000 mL  1,000 mL Intravenous Continuous Mohammed Kindle, MD      . midazolam (VERSED) 5 MG/5ML injection 5 mg  5 mg Intravenous Once Mohammed Kindle, MD      . orphenadrine (NORFLEX) injection 60 mg  60 mg Intramuscular Once Mohammed Kindle, MD      . triamcinolone acetonide (KENALOG-40) injection 40 mg  40 mg Other Once Mohammed Kindle, MD        Medical Decision Making:  Established Problem, Stable/Improving (1), Review of Medication Regimen & Side Effects (2) and Review of New Medication or Change in Dosage (2)  Treatment Plan Summary:Medication management and Plan Plan   Panic disorder with agoraphobia-continue Paxil 20 mg in the morning. Continue clonazepam 0.5 mg twice a day as needed.  Major depressive disorder, recurrent, moderate-Paxil as above.   Insomnia-Vistaril 50-100 mg at bedtime as needed for insomnia. Risk and benefits have been discussing patient's able to consent. She relates she has arty been on this medication.  I made patient aware my departure from the clinic in February. She is aware she  will be able to follow up with another provider within this clinic. She'll follow up in 3 months. She's been encouraged call any questions or concerns prior to her next appointment.  Faith Rogue 04/08/2015, 1:07 PM

## 2015-04-11 ENCOUNTER — Encounter: Payer: Self-pay | Admitting: Pain Medicine

## 2015-04-11 ENCOUNTER — Ambulatory Visit: Payer: BLUE CROSS/BLUE SHIELD | Attending: Pain Medicine | Admitting: Pain Medicine

## 2015-04-11 VITALS — BP 139/44 | HR 94 | Temp 98.7°F | Resp 16 | Ht 60.0 in | Wt 150.0 lb

## 2015-04-11 DIAGNOSIS — M47816 Spondylosis without myelopathy or radiculopathy, lumbar region: Secondary | ICD-10-CM

## 2015-04-11 DIAGNOSIS — M79604 Pain in right leg: Secondary | ICD-10-CM

## 2015-04-11 DIAGNOSIS — M79659 Pain in unspecified thigh: Secondary | ICD-10-CM

## 2015-04-11 DIAGNOSIS — M48062 Spinal stenosis, lumbar region with neurogenic claudication: Secondary | ICD-10-CM

## 2015-04-11 DIAGNOSIS — M7060 Trochanteric bursitis, unspecified hip: Secondary | ICD-10-CM | POA: Insufficient documentation

## 2015-04-11 DIAGNOSIS — M5126 Other intervertebral disc displacement, lumbar region: Secondary | ICD-10-CM | POA: Insufficient documentation

## 2015-04-11 DIAGNOSIS — M533 Sacrococcygeal disorders, not elsewhere classified: Secondary | ICD-10-CM

## 2015-04-11 DIAGNOSIS — M545 Low back pain: Secondary | ICD-10-CM | POA: Diagnosis present

## 2015-04-11 DIAGNOSIS — M4806 Spinal stenosis, lumbar region: Secondary | ICD-10-CM | POA: Insufficient documentation

## 2015-04-11 DIAGNOSIS — M5116 Intervertebral disc disorders with radiculopathy, lumbar region: Secondary | ICD-10-CM | POA: Diagnosis not present

## 2015-04-11 DIAGNOSIS — G5790 Unspecified mononeuropathy of unspecified lower limb: Secondary | ICD-10-CM

## 2015-04-11 DIAGNOSIS — M5416 Radiculopathy, lumbar region: Secondary | ICD-10-CM

## 2015-04-11 DIAGNOSIS — M79606 Pain in leg, unspecified: Secondary | ICD-10-CM | POA: Diagnosis present

## 2015-04-11 DIAGNOSIS — R252 Cramp and spasm: Secondary | ICD-10-CM

## 2015-04-11 DIAGNOSIS — M5136 Other intervertebral disc degeneration, lumbar region: Secondary | ICD-10-CM

## 2015-04-11 MED ORDER — HYDROCODONE-ACETAMINOPHEN 5-325 MG PO TABS: ORAL_TABLET | ORAL | Status: DC

## 2015-04-11 NOTE — Progress Notes (Signed)
Subjective:    Patient ID: Hannah [name], female    DOB: [birth year], 65 y.o.   MRN: [id number]  HPI  The patient is a 65 year old female who returns to pain management Center for further evaluation and treatment of pain involving the lower back and lower extremity regions. The patient states the pain occurs of the lower back region across the buttocks radiating to both the left and right sides. Patient admits to lower back pain aggravated by standing walking twisting turning maneuvers. The patient denies any recent trauma change in events of daily living to cause change in symptomatology. We discussed patient's condition and will proceed with interventional treatment at time return appointment in attempt to decrease severe disabling pain of the lower lumbar region and what appeared to be in the region of the sacroiliac joint. We will consider patient for additional modifications of treatment pending follow-up evaluation. We'll also discussed patient undergoing neurosurgical evaluation for further discussion and evaluation of her lower back lower extremity pain and to review findings noted on patient's lumbar MRI and to explain patient's symptomatology in further detail as we have previously accomplished with patient. The patient was with understanding and agreement suggested treatment plan. The patient wished to proceed with interventional treatment at time return appointment in attempt to decrease severe disabling pain of the lumbar lower extremity region.    Review of Systems     Objective:   Physical Exam  There was tenderness to palpation of paraspinal musculature in the cervical region cervical facet region palpation which reproduces minimal discomfort. There was minimal tenderness to palpation over the splenius capitis and occipitalis musculature regions. Palpation of the acromioclavicular and glenohumeral joint regions reproduce mild discomfort. Patient appeared to be with no significant  increased pain with Tinel and Phalen's maneuver and appeared to be with slightly decreased grip strength. There was tenderness in the region of the cervical facet cervical paraspinal musculature region as well as the thoracic facet thoracic paraspinal musculature with no crepitus of the thoracic region noted. Palpation over the region of the lumbar paraspinal musculature region lumbar facet region was associated increased pain especially the lower lumbar region with lateral bending rotation extension and palpation of the lumbar facets reproducing moderate to moderately severe discomfort. There was moderate to moderately severe tenderness to palpation of the PSIS and PII S region. Palpation along the greater trochanteric region iliotibial band region reproduced moderate to moderately severe discomfort as well straight leg raising was tolerates approximately 20 without increased pain with dorsiflexion noted. There was negative clonus negative Homans. No definite sensory deficit or dermatomal distribution was detected. Abdomen was nontender with no costovertebral angle tenderness noted.      Assessment & Plan:    Sacroiliac joint dysfunction  Degenerative disc disease lumbar spine Kyphotic curvature at the 1112 disc space, spondylosis and shallow disc bulge at this level resulting in mild canal and moderate bilateral foraminal stenosis and mild anterior and superior T12 upper endplate depression, lack of marrow edema indicative of chronic change with mild osseous and disc changes at the remaining levels with adrenal nodule. Disc desiccation central right with the central disc bulge resulting and right foraminal narrowing L3-4 level with moderate left and right facet arthrosis. L4-L5 disc desiccation. Shallow broad-based disc bulging and narrowing of the central canal and left foramen at L4-L5 level  Lumbar radiculopathy  Lumbar facet syndrome  Greater trochanteric bursitis     PLAN   Continue  present medication hydrocodone acetaminophen  Sacroiliac joint  injection to be performed at time return appointment  F/U PCP Dr. Ronette Deter for evaliation of  BP and general medical  condition   F/U surgical evaluation. We will schedule neurosurgical evaluation of lumbar and lower extremity pain paresthesias and weakness at this time . Ask secretaries and nurses the date of your neurosurgical evaluation  F/U neurological evaluation. May consider pending follow-up evaluations  F/U  psych evaluation  May consider radiofrequency rhizolysis or intraspinal procedures pending response to present treatment and F/U evaluation   Patient to call Pain Management Center should patient have concerns prior to scheduled return appointment

## 2015-04-11 NOTE — Progress Notes (Signed)
Safety precautions to be maintained throughout the outpatient stay will include: orient to surroundings, keep bed in low position, maintain call bell within reach at all times, provide assistance with transfer out of bed and ambulation.  

## 2015-04-11 NOTE — Progress Notes (Signed)
   Subjective:    Patient ID: Hannah [name], female    DOB: February 24, 1951, 66 y.o.   MRN: [id number]  HPI    Review of Systems     Objective:   Physical Exam        Assessment & Plan:

## 2015-04-11 NOTE — Patient Instructions (Addendum)
PLAN   Continue present medication hydrocodone acetaminophen  Sacroiliac joint injection to be performed at time return appointment  F/U PCP Dr. Ronette Deter for evaliation of  BP and general medical  condition   F/U surgical evaluation. We will schedule neurosurgical evaluation of lumbar and lower extremity pain paresthesias and weakness at this time . Ask secretaries and nurses the date of your neurosurgical evaluation  F/U neurological evaluation. May consider pending follow-up evaluations  F/U  psych evaluation  May consider radiofrequency rhizolysis or intraspinal procedures pending response to present treatment and F/U evaluation   Patient to call Pain Management Center should patient have concerns prior to scheduled return appointment.Pain Management Discharge Instructions  General Discharge Instructions :  If you need to reach your doctor call: Monday-Friday 8:00 am - 4:00 pm at [phone] or toll free [phone].  After clinic hours [phone] to have operator reach doctor.  Bring all of your medication bottles to all your appointments in the pain clinic.  To cancel or reschedule your appointment with Pain Management please remember to call 24 hours in advance to avoid a fee.  Refer to the educational materials which you have been given on: General Risks, I had my Procedure. Discharge Instructions, Post Sedation.  Post Procedure Instructions:  The drugs you were given will stay in your system until tomorrow, so for the next 24 hours you should not drive, make any legal decisions or drink any alcoholic beverages.  You may eat anything you prefer, but it is better to start with liquids then soups and crackers, and gradually work up to solid foods.  Please notify your doctor immediately if you have any unusual bleeding, trouble breathing or pain that is not related to your normal pain.  Depending on the type of procedure that was done, some parts of your body may  feel week and/or numb.  This usually clears up by tonight or the next day.  Walk with the use of an assistive device or accompanied by an adult for the 24 hours.  You may use ice on the affected area for the first 24 hours.  Put ice in a Ziploc bag and cover with a towel and place against area 15 minutes on 15 minutes off.  You may switch to heat after 24 hours.Sacroiliac (SI) Joint Injection Patient Information  Description: The sacroiliac joint connects the scrum (very low back and tailbone) to the ilium (a pelvic bone which also forms half of the hip joint).  Normally this joint experiences very little motion.  When this joint becomes inflamed or unstable low back and or hip and pelvis pain may result.  Injection of this joint with local anesthetics (numbing medicines) and steroids can provide diagnostic information and reduce pain.  This injection is performed with the aid of x-ray guidance into the tailbone area while you are lying on your stomach.   You may experience an electrical sensation down the leg while this is being done.  You may also experience numbness.  We also may ask if we are reproducing your normal pain during the injection.  Conditions which may be treated SI injection:   Low back, buttock, hip or leg pain  Preparation for the Injection:  1. Do not eat any solid food or dairy products within 6 hours of your appointment.  2. You may drink clear liquids up to 2 hours before appointment.  Clear liquids include water, black coffee, juice or soda.  No milk or cream please. 3. You  may take your regular medications, including pain medications with a sip of water before your appointment.  Diabetics should hold regular insulin (if take separately) and take 1/2 normal NPH dose the morning of the procedure.  Carry some sugar containing items with you to your appointment. 4. A driver must accompany you and be prepared to drive you home after your procedure. 5. Bring all of your  current medications with you. 6. An IV may be inserted and sedation may be given at the discretion of the physician. 7. A blood pressure cuff, EKG and other monitors will often be applied during the procedure.  Some patients may need to have extra oxygen administered for a short period.  8. You will be asked to provide medical information, including your allergies, prior to the procedure.  We must know immediately if you are taking blood thinners (like Coumadin/Warfarin) or if you are allergic to IV iodine contrast (dye).  We must know if you could possible be pregnant.  Possible side effects:   Bleeding from needle site  Infection (rare, may require surgery)  Nerve injury (rare)  Numbness & tingling (temporary)  A brief convulsion or seizure  Light-headedness (temporary)  Pain at injection site (several days)  Decreased blood pressure (temporary)  Weakness in the leg (temporary)   Call if you experience:   New onset weakness or numbness of an extremity below the injection site that last more than 8 hours.  Hives or difficulty breathing ( go to the emergency room)  Inflammation or drainage at the injection site  Any new symptoms which are concerning to you  Please note:  Although the local anesthetic injected can often make your back/ hip/ buttock/ leg feel good for several hours after the injections, the pain will likely return.  It takes 3-7 days for steroids to work in the sacroiliac area.  You may not notice any pain relief for at least that one week.  If effective, we will often do a series of three injections spaced 3-6 weeks apart to maximally decrease your pain.  After the initial series, we generally will wait some months before a repeat injection of the same type.  If you have any questions, please call [phone] [place]  What are the risk, side effects and possible  complications? Generally speaking, most procedures are safe.  However, with any procedure there are risks, side effects, and the possibility of complications.  The risks and complications are dependent upon the sites that are lesioned, or the type of nerve block to be performed.  The closer the procedure is to the spine, the more serious the risks are.  Great care is taken when placing the radio frequency needles, block needles or lesioning probes, but sometimes complications can occur. 1. Infection: Any time there is an injection through the skin, there is a risk of infection.  This is why sterile conditions are used for these blocks.  There are four possible types of infection. 1. Localized skin infection. 2. Central Nervous System Infection-This can be in the form of Meningitis, which can be deadly. 3. Epidural Infections-This can be in the form of an epidural abscess, which can cause pressure inside of the spine, causing compression of the spinal cord with subsequent paralysis. This would require an emergency surgery to decompress, and there are no guarantees that the patient would recover from the paralysis. 4. Discitis-This is an infection of the intervertebral discs.  It occurs in about 1% of discography procedures.  It is difficult to treat and it may lead to surgery.        2. Pain: the needles have to go through skin and soft tissues, will cause soreness.       3. Damage to internal structures:  The nerves to be lesioned may be near blood vessels or    other nerves which can be potentially damaged.       4. Bleeding: Bleeding is more common if the patient is taking blood thinners such as  aspirin, Coumadin, Ticiid, Plavix, etc., or if he/she have some genetic predisposition  such as hemophilia. Bleeding into the spinal canal can cause compression of the spinal  cord with subsequent paralysis.  This would require an emergency surgery to  decompress and there are no guarantees that the patient  would recover from the  paralysis.       5. Pneumothorax:  Puncturing of a lung is a possibility, every time a needle is introduced in  the area of the chest or upper back.  Pneumothorax refers to free air around the  collapsed lung(s), inside of the thoracic cavity (chest cavity).  Another two possible  complications related to a similar event would include: Hemothorax and Chylothorax.   These are variations of the Pneumothorax, where instead of air around the collapsed  lung(s), you may have blood or chyle, respectively.       6. Spinal headaches: They may occur with any procedures in the area of the spine.       7. Persistent CSF (Cerebro-Spinal Fluid) leakage: This is a rare problem, but may occur  with prolonged intrathecal or epidural catheters either due to the formation of a fistulous  track or a dural tear.       8. Nerve damage: By working so close to the spinal cord, there is always a possibility of  nerve damage, which could be as serious as a permanent spinal cord injury with  paralysis.       9. Death:  Although rare, severe deadly allergic reactions known as "Anaphylactic  reaction" can occur to any of the medications used.      10. Worsening of the symptoms:  We can always make thing worse.  What are the chances of something like this happening? Chances of any of this occuring are extremely low.  By statistics, you have more of a chance of getting killed in a motor vehicle accident: while driving to the hospital than any of the above occurring .  Nevertheless, you should be aware that they are possibilities.  In general, it is similar to taking a shower.  Everybody knows that you can slip, hit your head and get killed.  Does that mean that you should not shower again?  Nevertheless always keep in mind that statistics do not mean anything if you happen to be on the wrong side of them.  Even if a procedure has a 1 (one) in a 1,000,000 (million) chance of going wrong, it you happen to be that  one..Also, keep in mind that by statistics, you have more of a chance of having something go wrong when taking medications.  Who should not have this procedure? If you are on a blood thinning medication (e.g. Coumadin, Plavix, see list of "Blood Thinners"), or if you have an active infection going on, you should not have the procedure.  If you are taking any blood thinners, please inform your physician.  How should I prepare for this procedure?  Do not eat or drink anything at least six hours prior to the procedure.  Bring a driver with you .  It cannot be a taxi.  Come accompanied by an adult that can drive you back, and that is strong enough to help you if your legs get weak or numb from the local anesthetic.  Take all of your medicines the morning of the procedure with just enough water to swallow them.  If you have diabetes, make sure that you are scheduled to have your procedure done first thing in the morning, whenever possible.  If you have diabetes, take only half of your insulin dose and notify our nurse that you have done so as soon as you arrive at the clinic.  If you are diabetic, but only take blood sugar pills (oral hypoglycemic), then do not take them on the morning of your procedure.  You may take them after you have had the procedure.  Do not take aspirin or any aspirin-containing medications, at least eleven (11) days prior to the procedure.  They may prolong bleeding.  Wear loose fitting clothing that may be easy to take off and that you would not mind if it got stained with Betadine or blood.  Do not wear any jewelry or perfume  Remove any nail coloring.  It will interfere with some of our monitoring equipment.  NOTE: Remember that this is not meant to be interpreted as a complete list of all possible complications.  Unforeseen problems may occur.  BLOOD THINNERS The following drugs contain aspirin or other products, which can cause increased bleeding during  surgery and should not be taken for 2 weeks prior to and 1 week after surgery.  If you should need take something for relief of minor pain, you may take acetaminophen which is found in Tylenol,m Datril, Anacin-3 and Panadol. It is not blood thinner. The products listed below are.  Do not take any of the products listed below in addition to any listed on your instruction sheet.  A.P.C or A.P.C with Codeine Codeine Phosphate Capsules #3 Ibuprofen Ridaura  ABC compound Congesprin Imuran rimadil  Advil Cope Indocin Robaxisal  Alka-Seltzer Effervescent Pain Reliever and Antacid Coricidin or Coricidin-D  Indomethacin Rufen  Alka-Seltzer plus Cold Medicine Cosprin Ketoprofen S-A-C Tablets  Anacin Analgesic Tablets or Capsules Coumadin Korlgesic Salflex  Anacin Extra Strength Analgesic tablets or capsules CP-2 Tablets Lanoril Salicylate  Anaprox Cuprimine Capsules Levenox Salocol  Anexsia-D Dalteparin Magan Salsalate  Anodynos Darvon compound Magnesium Salicylate Sine-off  Ansaid Dasin Capsules Magsal Sodium Salicylate  Anturane Depen Capsules Marnal Soma  APF Arthritis pain formula Dewitt's Pills Measurin Stanback  Argesic Dia-Gesic Meclofenamic Sulfinpyrazone  Arthritis Bayer Timed Release Aspirin Diclofenac Meclomen Sulindac  Arthritis pain formula Anacin Dicumarol Medipren Supac  Analgesic (Safety coated) Arthralgen Diffunasal Mefanamic Suprofen  Arthritis Strength Bufferin Dihydrocodeine Mepro Compound Suprol  Arthropan liquid Dopirydamole Methcarbomol with Aspirin Synalgos  ASA tablets/Enseals Disalcid Micrainin Tagament  Ascriptin Doan's Midol Talwin  Ascriptin A/D Dolene Mobidin Tanderil  Ascriptin Extra Strength Dolobid Moblgesic Ticlid  Ascriptin with Codeine Doloprin or Doloprin with Codeine Momentum Tolectin  Asperbuf Duoprin Mono-gesic Trendar  Aspergum Duradyne Motrin or Motrin IB Triminicin  Aspirin plain, buffered or enteric coated Durasal Myochrisine Trigesic  Aspirin  Suppositories Easprin Nalfon Trillsate  Aspirin with Codeine Ecotrin Regular or Extra Strength Naprosyn Uracel  Atromid-S Efficin Naproxen Ursinus  Auranofin Capsules Elmiron Neocylate Vanquish  Axotal Emagrin Norgesic Verin  Azathioprine  Empirin or Empirin with Codeine Normiflo Vitamin E  Azolid Emprazil Nuprin Voltaren  Bayer Aspirin plain, buffered or children's or timed BC Tablets or powders Encaprin Orgaran Warfarin Sodium  Buff-a-Comp Enoxaparin Orudis Zorpin  Buff-a-Comp with Codeine Equegesic Os-Cal-Gesic   Buffaprin Excedrin plain, buffered or Extra Strength Oxalid   Bufferin Arthritis Strength Feldene Oxphenbutazone   Bufferin plain or Extra Strength Feldene Capsules Oxycodone with Aspirin   Bufferin with Codeine Fenoprofen Fenoprofen Pabalate or Pabalate-SF   Buffets II Flogesic Panagesic   Buffinol plain or Extra Strength Florinal or Florinal with Codeine Panwarfarin   Buf-Tabs Flurbiprofen Penicillamine   Butalbital Compound Four-way cold tablets Penicillin   Butazolidin Fragmin Pepto-Bismol   Carbenicillin Geminisyn Percodan   Carna Arthritis Reliever Geopen Persantine   Carprofen Gold's salt Persistin   Chloramphenicol Goody's Phenylbutazone   Chloromycetin Haltrain Piroxlcam   Clmetidine heparin Plaquenil   Cllnoril Hyco-pap Ponstel   Clofibrate Hydroxy chloroquine Propoxyphen         Before stopping any of these medications, be sure to consult the physician who ordered them.  Some, such as Coumadin (Warfarin) are ordered to prevent or treat serious conditions such as "deep thrombosis", "pumonary embolisms", and other heart problems.  The amount of time that you may need off of the medication may also vary with the medication and the reason for which you were taking it.  If you are taking any of these medications, please make sure you notify your pain physician before you undergo any procedures.

## 2015-04-12 ENCOUNTER — Ambulatory Visit
Admission: RE | Admit: 2015-04-12 | Payer: BLUE CROSS/BLUE SHIELD | Source: Ambulatory Visit | Admitting: Gastroenterology

## 2015-04-12 ENCOUNTER — Encounter: Admission: RE | Payer: Self-pay | Source: Ambulatory Visit

## 2015-04-12 SURGERY — COLONOSCOPY WITH PROPOFOL
Anesthesia: General

## 2015-04-15 ENCOUNTER — Ambulatory Visit: Payer: Self-pay | Admitting: Pain Medicine

## 2015-04-15 ENCOUNTER — Telehealth: Payer: Self-pay | Admitting: Pain Medicine

## 2015-04-15 NOTE — Telephone Encounter (Signed)
Patient left vmail on 04-15-15 at 8:02 she is unable to come in due to transportation canceled, also she is not feeling well today either

## 2015-04-24 ENCOUNTER — Encounter: Payer: Self-pay | Admitting: Pain Medicine

## 2015-04-24 ENCOUNTER — Ambulatory Visit: Payer: BLUE CROSS/BLUE SHIELD | Attending: Pain Medicine | Admitting: Pain Medicine

## 2015-04-24 VITALS — BP 144/68 | HR 74 | Temp 98.3°F | Resp 14 | Ht 60.0 in | Wt 152.0 lb

## 2015-04-24 DIAGNOSIS — M545 Low back pain: Secondary | ICD-10-CM | POA: Diagnosis present

## 2015-04-24 DIAGNOSIS — M533 Sacrococcygeal disorders, not elsewhere classified: Secondary | ICD-10-CM

## 2015-04-24 DIAGNOSIS — R41840 Attention and concentration deficit: Secondary | ICD-10-CM

## 2015-04-24 DIAGNOSIS — M5126 Other intervertebral disc displacement, lumbar region: Secondary | ICD-10-CM | POA: Insufficient documentation

## 2015-04-24 DIAGNOSIS — M47816 Spondylosis without myelopathy or radiculopathy, lumbar region: Secondary | ICD-10-CM | POA: Insufficient documentation

## 2015-04-24 DIAGNOSIS — M79604 Pain in right leg: Secondary | ICD-10-CM

## 2015-04-24 DIAGNOSIS — R252 Cramp and spasm: Secondary | ICD-10-CM

## 2015-04-24 DIAGNOSIS — M5416 Radiculopathy, lumbar region: Secondary | ICD-10-CM

## 2015-04-24 DIAGNOSIS — M79659 Pain in unspecified thigh: Secondary | ICD-10-CM

## 2015-04-24 DIAGNOSIS — M5136 Other intervertebral disc degeneration, lumbar region: Secondary | ICD-10-CM | POA: Diagnosis not present

## 2015-04-24 DIAGNOSIS — I10 Essential (primary) hypertension: Secondary | ICD-10-CM

## 2015-04-24 DIAGNOSIS — G5790 Unspecified mononeuropathy of unspecified lower limb: Secondary | ICD-10-CM

## 2015-04-24 DIAGNOSIS — M48062 Spinal stenosis, lumbar region with neurogenic claudication: Secondary | ICD-10-CM

## 2015-04-24 DIAGNOSIS — M7060 Trochanteric bursitis, unspecified hip: Secondary | ICD-10-CM

## 2015-04-24 DIAGNOSIS — M79606 Pain in leg, unspecified: Secondary | ICD-10-CM | POA: Diagnosis present

## 2015-04-24 MED ORDER — BUPIVACAINE HCL (PF) 0.25 % IJ SOLN
INTRAMUSCULAR | Status: AC
Start: 2015-04-24 — End: 2015-04-24
  Administered 2015-04-24: 10:00:00
  Filled 2015-04-24: qty 30

## 2015-04-24 MED ORDER — ORPHENADRINE CITRATE 30 MG/ML IJ SOLN: 60.0000 mg | Freq: Once | INTRAMUSCULAR | Status: DC

## 2015-04-24 MED ORDER — MIDAZOLAM HCL 5 MG/5ML IJ SOLN: 5.0000 mg | Freq: Once | INTRAMUSCULAR | Status: DC

## 2015-04-24 MED ORDER — LACTATED RINGERS IV SOLN: 1000.0000 mL | INTRAVENOUS | Status: DC

## 2015-04-24 MED ORDER — TRIAMCINOLONE ACETONIDE 40 MG/ML IJ SUSP
INTRAMUSCULAR | Status: AC
Start: 2015-04-24 — End: 2015-04-24
  Administered 2015-04-24: 10:00:00
  Filled 2015-04-24: qty 1

## 2015-04-24 MED ORDER — CEFAZOLIN SODIUM 1 G IJ SOLR
INTRAMUSCULAR | Status: AC
  Administered 2015-04-24: 1 g via INTRAVENOUS
  Filled 2015-04-24: qty 10

## 2015-04-24 MED ORDER — MIDAZOLAM HCL 5 MG/5ML IJ SOLN
INTRAMUSCULAR | Status: AC
  Administered 2015-04-24: 2 mg via INTRAVENOUS
  Filled 2015-04-24: qty 5

## 2015-04-24 MED ORDER — BUPIVACAINE HCL (PF) 0.25 % IJ SOLN: 30.0000 mL | Freq: Once | INTRAMUSCULAR | Status: DC

## 2015-04-24 MED ORDER — ORPHENADRINE CITRATE 30 MG/ML IJ SOLN
INTRAMUSCULAR | Status: AC
  Administered 2015-04-24: 10:00:00
  Filled 2015-04-24: qty 2

## 2015-04-24 MED ORDER — CEFUROXIME AXETIL 250 MG PO TABS: 250.0000 mg | ORAL_TABLET | Freq: Two times a day (BID) | ORAL | Status: DC

## 2015-04-24 MED ORDER — TRIAMCINOLONE ACETONIDE 40 MG/ML IJ SUSP: 40.0000 mg | Freq: Once | INTRAMUSCULAR | Status: DC

## 2015-04-24 MED ORDER — FENTANYL CITRATE (PF) 100 MCG/2ML IJ SOLN: 100.0000 ug | Freq: Once | INTRAMUSCULAR | Status: DC

## 2015-04-24 MED ORDER — FENTANYL CITRATE (PF) 100 MCG/2ML IJ SOLN
INTRAMUSCULAR | Status: AC
  Administered 2015-04-24: 100 ug via INTRAVENOUS
  Filled 2015-04-24: qty 2

## 2015-04-24 MED ORDER — CEFAZOLIN SODIUM 1-5 GM-% IV SOLN: 1.0000 g | Freq: Once | INTRAVENOUS | Status: DC

## 2015-04-24 NOTE — Patient Instructions (Addendum)
PLAN   Continue present medication hydrocodone acetaminophen . Please get Ceftin antibiotic today and begin taking Ceftin antibiotic as prescribed  F/U PCP Dr. Ronette Deter for evaliation of  BP and general medical  condition   F/U surgical evaluation. May consider pending follow-up evaluations  F/U neurological evaluation. May consider pending follow-up evaluations  F/U  psych evaluation  May consider radiofrequency rhizolysis or intraspinal procedures pending response to present treatment and F/U evaluation   Patient to call [facility] should patient have concerns prior to scheduled return appointmentGENERAL RISKS AND COMPLICATIONS  What are the risk, side effects and possible complications? Generally speaking, most procedures are safe.  However, with any procedure there are risks, side effects, and the possibility of complications.  The risks and complications are dependent upon the sites that are lesioned, or the type of nerve block to be performed.  The closer the procedure is to the spine, the more serious the risks are.  Great care is taken when placing the radio frequency needles, block needles or lesioning probes, but sometimes complications can occur. 1. Infection: Any time there is an injection through the skin, there is a risk of infection.  This is why sterile conditions are used for these blocks.  There are four possible types of infection. 1. Localized skin infection. 2. Central Nervous System Infection-This can be in the form of Meningitis, which can be deadly. 3. Epidural Infections-This can be in the form of an epidural abscess, which can cause pressure inside of the spine, causing compression of the spinal cord with subsequent paralysis. This would require an emergency surgery to decompress, and there are no guarantees that the patient would recover from the paralysis. 4. Discitis-This is an infection of the intervertebral discs.  It occurs in about 1% of  discography procedures.  It is difficult to treat and it may lead to surgery.        2. Pain: the needles have to go through skin and soft tissues, will cause soreness.       3. Damage to internal structures:  The nerves to be lesioned may be near blood vessels or    other nerves which can be potentially damaged.       4. Bleeding: Bleeding is more common if the patient is taking blood thinners such as  aspirin, Coumadin, Ticiid, Plavix, etc., or if he/she have some genetic predisposition  such as hemophilia. Bleeding into the spinal canal can cause compression of the spinal  cord with subsequent paralysis.  This would require an emergency surgery to  decompress and there are no guarantees that the patient would recover from the  paralysis.       5. Pneumothorax:  Puncturing of a lung is a possibility, every time a needle is introduced in  the area of the chest or upper back.  Pneumothorax refers to free air around the  collapsed lung(s), inside of the thoracic cavity (chest cavity).  Another two possible  complications related to a similar event would include: Hemothorax and Chylothorax.   These are variations of the Pneumothorax, where instead of air around the collapsed  lung(s), you may have blood or chyle, respectively.       6. Spinal headaches: They may occur with any procedures in the area of the spine.       7. Persistent CSF (Cerebro-Spinal Fluid) leakage: This is a rare problem, but may occur  with prolonged intrathecal or epidural catheters either due to the formation of  a fistulous  track or a dural tear.       8. Nerve damage: By working so close to the spinal cord, there is always a possibility of  nerve damage, which could be as serious as a permanent spinal cord injury with  paralysis.       9. Death:  Although rare, severe deadly allergic reactions known as "Anaphylactic  reaction" can occur to any of the medications used.      10. Worsening of the symptoms:  We can always make thing  worse.  What are the chances of something like this happening? Chances of any of this occuring are extremely low.  By statistics, you have more of a chance of getting killed in a motor vehicle accident: while driving to the hospital than any of the above occurring .  Nevertheless, you should be aware that they are possibilities.  In general, it is similar to taking a shower.  Everybody knows that you can slip, hit your head and get killed.  Does that mean that you should not shower again?  Nevertheless always keep in mind that statistics do not mean anything if you happen to be on the wrong side of them.  Even if a procedure has a 1 (one) in a 1,000,000 (million) chance of going wrong, it you happen to be that one..Also, keep in mind that by statistics, you have more of a chance of having something go wrong when taking medications.  Who should not have this procedure? If you are on a blood thinning medication (e.g. Coumadin, Plavix, see list of "Blood Thinners"), or if you have an active infection going on, you should not have the procedure.  If you are taking any blood thinners, please inform your physician.  How should I prepare for this procedure?  Do not eat or drink anything at least six hours prior to the procedure.  Bring a driver with you .  It cannot be a taxi.  Come accompanied by an adult that can drive you back, and that is strong enough to help you if your legs get weak or numb from the local anesthetic.  Take all of your medicines the morning of the procedure with just enough water to swallow them.  If you have diabetes, make sure that you are scheduled to have your procedure done first thing in the morning, whenever possible.  If you have diabetes, take only half of your insulin dose and notify our nurse that you have done so as soon as you arrive at the clinic.  If you are diabetic, but only take blood sugar pills (oral hypoglycemic), then do not take them on the morning of your  procedure.  You may take them after you have had the procedure.  Do not take aspirin or any aspirin-containing medications, at least eleven (11) days prior to the procedure.  They may prolong bleeding.  Wear loose fitting clothing that may be easy to take off and that you would not mind if it got stained with Betadine or blood.  Do not wear any jewelry or perfume  Remove any nail coloring.  It will interfere with some of our monitoring equipment.  NOTE: Remember that this is not meant to be interpreted as a complete list of all possible complications.  Unforeseen problems may occur.  BLOOD THINNERS The following drugs contain aspirin or other products, which can cause increased bleeding during surgery and should not be taken for 2 weeks prior to and 1  week after surgery.  If you should need take something for relief of minor pain, you may take acetaminophen which is found in Tylenol,m Datril, Anacin-3 and Panadol. It is not blood thinner. The products listed below are.  Do not take any of the products listed below in addition to any listed on your instruction sheet.  A.P.C or A.P.C with Codeine Codeine Phosphate Capsules #3 Ibuprofen Ridaura  ABC compound Congesprin Imuran rimadil  Advil Cope Indocin Robaxisal  Alka-Seltzer Effervescent Pain Reliever and Antacid Coricidin or Coricidin-D  Indomethacin Rufen  Alka-Seltzer plus Cold Medicine Cosprin Ketoprofen S-A-C Tablets  Anacin Analgesic Tablets or Capsules Coumadin Korlgesic Salflex  Anacin Extra Strength Analgesic tablets or capsules CP-2 Tablets Lanoril Salicylate  Anaprox Cuprimine Capsules Levenox Salocol  Anexsia-D Dalteparin Magan Salsalate  Anodynos Darvon compound Magnesium Salicylate Sine-off  Ansaid Dasin Capsules Magsal Sodium Salicylate  Anturane Depen Capsules Marnal Soma  APF Arthritis pain formula Dewitt's Pills Measurin Stanback  Argesic Dia-Gesic Meclofenamic Sulfinpyrazone  Arthritis Bayer Timed Release Aspirin  Diclofenac Meclomen Sulindac  Arthritis pain formula Anacin Dicumarol Medipren Supac  Analgesic (Safety coated) Arthralgen Diffunasal Mefanamic Suprofen  Arthritis Strength Bufferin Dihydrocodeine Mepro Compound Suprol  Arthropan liquid Dopirydamole Methcarbomol with Aspirin Synalgos  ASA tablets/Enseals Disalcid Micrainin Tagament  Ascriptin Doan's Midol Talwin  Ascriptin A/D Dolene Mobidin Tanderil  Ascriptin Extra Strength Dolobid Moblgesic Ticlid  Ascriptin with Codeine Doloprin or Doloprin with Codeine Momentum Tolectin  Asperbuf Duoprin Mono-gesic Trendar  Aspergum Duradyne Motrin or Motrin IB Triminicin  Aspirin plain, buffered or enteric coated Durasal Myochrisine Trigesic  Aspirin Suppositories Easprin Nalfon Trillsate  Aspirin with Codeine Ecotrin Regular or Extra Strength Naprosyn Uracel  Atromid-S Efficin Naproxen Ursinus  Auranofin Capsules Elmiron Neocylate Vanquish  Axotal Emagrin Norgesic Verin  Azathioprine Empirin or Empirin with Codeine Normiflo Vitamin E  Azolid Emprazil Nuprin Voltaren  Bayer Aspirin plain, buffered or children's or timed BC Tablets or powders Encaprin Orgaran Warfarin Sodium  Buff-a-Comp Enoxaparin Orudis Zorpin  Buff-a-Comp with Codeine Equegesic Os-Cal-Gesic   Buffaprin Excedrin plain, buffered or Extra Strength Oxalid   Bufferin Arthritis Strength Feldene Oxphenbutazone   Bufferin plain or Extra Strength Feldene Capsules Oxycodone with Aspirin   Bufferin with Codeine Fenoprofen Fenoprofen Pabalate or Pabalate-SF   Buffets II Flogesic Panagesic   Buffinol plain or Extra Strength Florinal or Florinal with Codeine Panwarfarin   Buf-Tabs Flurbiprofen Penicillamine   Butalbital Compound Four-way cold tablets Penicillin   Butazolidin Fragmin Pepto-Bismol   Carbenicillin Geminisyn Percodan   Carna Arthritis Reliever Geopen Persantine   Carprofen Gold's salt Persistin   Chloramphenicol Goody's Phenylbutazone   Chloromycetin Haltrain Piroxlcam    Clmetidine heparin Plaquenil   Cllnoril Hyco-pap Ponstel   Clofibrate Hydroxy chloroquine Propoxyphen         Before stopping any of these medications, be sure to consult the physician who ordered them.  Some, such as Coumadin (Warfarin) are ordered to prevent or treat serious conditions such as "deep thrombosis", "pumonary embolisms", and other heart problems.  The amount of time that you may need off of the medication may also vary with the medication and the reason for which you were taking it.  If you are taking any of these medications, please make sure you notify your pain physician before you undergo any procedures.         Drug Holidays  Definitions Tolerance:  Defined as the progressively decreased responsiveness to a drug.  Occurs when the drug is used repeatedly and the body adapts to the continued  presence of the drug.  As a result, a larger dose of the drug is needed to achieve the effect originally obtained by a smaller dose.  It is thought to be due to the formation of excess opioid receptors.  Drug Holiday:   Is when a patient stops taking a medication(s) for a period of time; anywhere from a few days to several weeks.  Withdrawals:   Refers to the wide range of symptoms that occur after stopping or dramatically reducing opiate drugs after heavy or prolonged use.  Withdrawal symptoms do not occur to patients that use low dose opioids, or those who take the medication sporadically.  Contrary to benzodiazepine (example: Valium, Xanax, etc.) or alcohol withdrawals ("Delirium Tremens"), opioid withdrawals are not lethal.  Withdrawals are the physical manifestation of the body getting rid of the excess receptors.  Purpose To eliminate tolerance.  Duration of Holiday 14 consecutive days. (2 weeks)  Expected Symptoms Early symptoms of withdrawal include:   Agitation  Anxiety  Muscle Aches   Increased tearing  Insomnia  Runny Nose   Sweating  Yawning     Late  symptoms of withdrawal include:   Abdominal cramping  Diarrhea  Dilated pupils   Goose bumps  Nausea  Vomiting   Opioid withdrawal reactions are very uncomfortable but are not life-threatening.  Symptoms usually start within 12 hours of last opioid dose and within 30 hours of last methadone exposure.  Duration of Symptoms 48-72 hours of short acting medications and 2 to 4 days for methadone.  Treatment  Clonidine (Catepres) or tizanidine (Zanaflex) for agitation, sweating, tearing, runny nose.  Promethazine (Phenergan) for nausea, vomiting.  NSAIDs for pain.  Benefits 12. Improved effectiveness of opioids. 13. Decreased opioid dose needed to achieve benefits. 14. Improved pain with lesser dose.

## 2015-04-24 NOTE — Progress Notes (Signed)
Subjective:    Patient ID: Hannah [name], female    DOB: 07-Feb-1951, 65 y.o.   MRN: [id number]  HPI  NOTE:  The patient is a 65 y.o. female who [facility] the [facility] for further evaluation and treatment of pain involving the lumbar and lower extremity region with pain involving the region of the hip and buttocks as well.  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 Disc desiccation central right with the central disc bulge resulting and right foraminal narrowing L3-4 level with moderate left and right facet arthrosis. L4-L5 disc desiccation. Shallow broad-based disc bulging and narrowing of the central canal and left foramen at L4-L5 level.. The patient is with reproduction of severe pain with palpation over the PSIS and PII S regions. There is concern regarding patient's pain being due to significant component of sacroiliac joint dysfunction. We have discussed patient's condition.  The risks, benefits, and expectations of the procedure have been discussed and explained to the patient who is understanding and wishes to proceed with interventional treatment as planned.    PROCEDURE: Sacroiliac joint on the left side injection with IV Versed, IV Fentanyl conscious sedation, EKG, blood pressure, pulse, pulse oximetry monitoring.  Procedure was performed with patient in prone position under fluoroscopic guidance.    Inferior pole sacroiliac joint injection on the left side.  With patient in prone position and Betadine prep of proposed entry site of accomplished, 22 -gauge needle was inserted at the  inferior pole of the sacroiliac joint on the left under fluoroscopic guidance.  Following documentation of needle placement at the inferior pole of the sacroiliac joint on the left, a total of 1cc 0.25% bupivacaine with kenalog was injected for left inferior pole sacroiliac joint injection.  Needle was removed.    Superior pole sacroiliac joint injection on the left side.  With patient in prone position and Betadine prep of proposed entry site of accomplished, 22-gauge needle was inserted at the superior pole of the sacroiliac joint on the left under fluoroscopic guidance.  Following documentation of needle placement at the inferior pole of the sacroiliac joint on the left, a total of 1cc 0.25% bupivacaine with kenalog was injected for left superior pole sacroiliac joint injection.  Needle was removed.   SACROILIAC JOINT INJECTION ON THE RIGHT SIDE  The procedure was performed on the right side exactly as was performed on the left side utilizing the same technique and under fluoroscopic guidance for both superior pole sacroiliac joint injections and inferior pole sacroiliac joint injections on the right side     Patient tolerated procedure well.  A total of 10 mg Kenalog was utilized for the entire procedure.  PLAN:    1. Medications: Will continue presently prescribed medication hydrocodone acetaminophen 2. Patient to follow up with primary care physician Dr. Ronette Deter for evaluation of blood pressure and general medical condition status post sacroiliac joint injection performed on today's visit. 3. Surgical evaluation is discussed. Has been addressed 4. Neurological evaluation with PNCV/EMG studies as discussed. 5. Patient may be candidate for radiofrequency procedures, Botox injections, implantation-type procedures, and other treatment pending response to treatment  and follow-up evaluation. 6. Patient has been advised to adhere to proper body mechanics and to call [facility]  prior to scheduled return appointment should there be significant change in condition or patient have other concerns regarding condition prior to scheduled return appointment.    Patient with understanding and in agreement with suggested treatment plan.    Review of Systems     Objective:   Physical Exam        Assessment & Plan:

## 2015-04-24 NOTE — Progress Notes (Signed)
Pt stated "I feel dizzy." "I felt that way when I came in." Instructed pt to see pcp verbalized understanding. N. Hensleyrn

## 2015-04-24 NOTE — Progress Notes (Signed)
Safety precautions to be maintained throughout the outpatient stay will include: orient to surroundings, keep bed in low position, maintain call bell within reach at all times, provide assistance with transfer out of bed and ambulation.  

## 2015-04-25 ENCOUNTER — Telehealth: Payer: Self-pay | Admitting: *Deleted

## 2015-04-25 NOTE — Telephone Encounter (Signed)
Message left

## 2015-04-29 ENCOUNTER — Ambulatory Visit: Payer: Self-pay | Admitting: Cardiovascular Disease

## 2015-04-29 ENCOUNTER — Ambulatory Visit
Admission: RE | Admit: 2015-04-29 | Payer: BLUE CROSS/BLUE SHIELD | Source: Ambulatory Visit | Admitting: Gastroenterology

## 2015-04-29 ENCOUNTER — Encounter: Admission: RE | Payer: Self-pay | Source: Ambulatory Visit

## 2015-04-29 SURGERY — COLONOSCOPY WITH PROPOFOL
Anesthesia: General

## 2015-05-07 ENCOUNTER — Ambulatory Visit: Payer: BLUE CROSS/BLUE SHIELD | Attending: Pain Medicine | Admitting: Pain Medicine

## 2015-05-07 ENCOUNTER — Encounter: Payer: Self-pay | Admitting: Pain Medicine

## 2015-05-07 VITALS — BP 141/72 | HR 80 | Temp 98.3°F | Resp 16 | Ht 60.0 in | Wt 152.0 lb

## 2015-05-07 DIAGNOSIS — M5416 Radiculopathy, lumbar region: Secondary | ICD-10-CM

## 2015-05-07 DIAGNOSIS — M79606 Pain in leg, unspecified: Secondary | ICD-10-CM | POA: Diagnosis present

## 2015-05-07 DIAGNOSIS — M5126 Other intervertebral disc displacement, lumbar region: Secondary | ICD-10-CM | POA: Insufficient documentation

## 2015-05-07 DIAGNOSIS — M79604 Pain in right leg: Secondary | ICD-10-CM

## 2015-05-07 DIAGNOSIS — M5116 Intervertebral disc disorders with radiculopathy, lumbar region: Secondary | ICD-10-CM | POA: Diagnosis not present

## 2015-05-07 DIAGNOSIS — M51369 Other intervertebral disc degeneration, lumbar region without mention of lumbar back pain or lower extremity pain: Secondary | ICD-10-CM

## 2015-05-07 DIAGNOSIS — G5790 Unspecified mononeuropathy of unspecified lower limb: Secondary | ICD-10-CM

## 2015-05-07 DIAGNOSIS — M545 Low back pain: Secondary | ICD-10-CM | POA: Diagnosis present

## 2015-05-07 DIAGNOSIS — M47816 Spondylosis without myelopathy or radiculopathy, lumbar region: Secondary | ICD-10-CM

## 2015-05-07 DIAGNOSIS — M7060 Trochanteric bursitis, unspecified hip: Secondary | ICD-10-CM

## 2015-05-07 DIAGNOSIS — M4806 Spinal stenosis, lumbar region: Secondary | ICD-10-CM | POA: Diagnosis not present

## 2015-05-07 DIAGNOSIS — M533 Sacrococcygeal disorders, not elsewhere classified: Secondary | ICD-10-CM

## 2015-05-07 DIAGNOSIS — M48062 Spinal stenosis, lumbar region with neurogenic claudication: Secondary | ICD-10-CM

## 2015-05-07 DIAGNOSIS — M5136 Other intervertebral disc degeneration, lumbar region: Secondary | ICD-10-CM

## 2015-05-07 DIAGNOSIS — R252 Cramp and spasm: Secondary | ICD-10-CM

## 2015-05-07 DIAGNOSIS — M79659 Pain in unspecified thigh: Secondary | ICD-10-CM

## 2015-05-07 MED ORDER — HYDROCODONE-ACETAMINOPHEN 5-325 MG PO TABS: ORAL_TABLET | ORAL | Status: DC

## 2015-05-07 NOTE — Progress Notes (Signed)
Safety precautions to be maintained throughout the outpatient stay will include: orient to surroundings, keep bed in low position, maintain call bell within reach at all times, provide assistance with transfer out of bed and ambulation.  

## 2015-05-07 NOTE — Patient Instructions (Addendum)
PLAN   Continue present medication hydrocodone acetaminophen  Lumbosacral selective nerve root block to be performed at time of return appointment  F/U PCP Dr. Ronette Deter for evaliation of  BP and general medical  condition   F/U surgical evaluation.   Ask secretaries and nurses the date of your neurosurgical evaluation  F/U neurological evaluation. May consider pending follow-up evaluations  F/U  psych evaluation  May consider radiofrequency rhizolysis or intraspinal procedures pending response to present treatment and F/U evaluation   Patient to call [facility] should patient have concerns prior to scheduled return appointmentSelective Nerve Root Block Patient Information  Description: Specific nerve roots exit the spinal canal and these nerves can be compressed and inflamed by a bulging disc and bone spurs.  By injecting steroids on the nerve root, we can potentially decrease the inflammation surrounding these nerves, which often leads to decreased pain.  Also, by injecting local anesthesia on the nerve root, this can provide [place] helpful information to give to your referring doctor if it decreases your pain.  Selective nerve root blocks can be done along the spine from the neck to the low back depending on the location of your pain.   After numbing the skin with local anesthesia, a small needle is passed to the nerve root and the position of the needle is verified using x-ray pictures.  After the needle is in correct position, we then deposit the medication.  You may experience a pressure sensation while this is being done.  The entire block usually lasts less than 15 minutes.  Conditions that may be treated with selective nerve root blocks:  Low back and leg pain  Spinal stenosis  Diagnostic block prior to potential surgery  Neck and arm pain  Post laminectomy syndrome  Preparation for the injection:  1. Do not eat any solid food or dairy products within 6  hours of your appointment. 2. You may drink clear liquids up to 2 hours before an appointment.  Clear liquids include water, black coffee, juice or soda.  No milk or cream please. 3. You may take your regular medications, including pain medications, with a sip of water before your appointment.  Diabetics should hold regular insulin (if taken separately) and take 1/2 normal NPH dose the morning of the procedure.  Carry some sugar containing items with you to your appointment. 4. A driver must accompany you and be prepared to drive you home after your procedure. 5. Bring all your current medications with you. 6. An IV may be inserted and sedation may be given at the discretion of the physician. 7. A blood pressure cuff, EKG, and other monitors will often be applied during the procedure.  Some patients may need to have extra oxygen administered for a short period. 8. You will be asked to provide medical information, including allergies, prior to the procedure.  We must know immediately if you are taking blood  Thinners (like Coumadin) or if you are allergic to IV iodine contrast (dye).  Possible side-effects: All are usually temporary  Bleeding from needle site  Light headedness  Numbness and tingling  Decreased blood pressure  Weakness in arms/legs  Pressure sensation in back/neck  Pain at injection site (several days)  Possible complications: All are extremely rare  Infection  Nerve injury  Spinal headache (a headache wore with upright position)  Call if you experience:  Fever/chills associated with headache or increased back/neck pain  Headache worsened by an upright position  [place]  onset weakness or numbness of an extremity below the injection site  Hives or difficulty breathing (go to the emergency room)  Inflammation or drainage at the injection site(s)  Severe back/neck pain greater than usual  New symptoms which are concerning to you  Please note:  Although  the local anesthetic injected can often make your back or neck feel good for several hours after the injection the pain will likely return.  It takes 3-5 days for steroids to work on the nerve root. You may not notice any pain relief for at least one week.  If effective, we will often do a series of 3 injections spaced 3-6 weeks apart to maximally decrease your pain.    If you have any questions, please call [phone] [facility] ClinicGENERAL RISKS AND COMPLICATIONS  What are the risk, side effects and possible complications? Generally speaking, most procedures are safe.  However, with any procedure there are risks, side effects, and the possibility of complications.  The risks and complications are dependent upon the sites that are lesioned, or the type of nerve block to be performed.  The closer the procedure is to the spine, the more serious the risks are.  Great care is taken when placing the radio frequency needles, block needles or lesioning probes, but sometimes complications can occur. 1. Infection: Any time there is an injection through the skin, there is a risk of infection.  This is why sterile conditions are used for these blocks.  There are four possible types of infection. 1. Localized skin infection. 2. Central Nervous System Infection-This can be in the form of Meningitis, which can be deadly. 3. Epidural Infections-This can be in the form of an epidural abscess, which can cause pressure inside of the spine, causing compression of the spinal cord with subsequent paralysis. This would require an emergency surgery to decompress, and there are no guarantees that the patient would recover from the paralysis. 4. Discitis-This is an infection of the intervertebral discs.  It occurs in about 1% of discography procedures.  It is difficult to treat and it may lead to surgery.        2. Pain: the needles have to go through skin and soft tissues, will cause  soreness.       3. Damage to internal structures:  The nerves to be lesioned may be near blood vessels or    other nerves which can be potentially damaged.       4. Bleeding: Bleeding is more common if the patient is taking blood thinners such as  aspirin, Coumadin, Ticiid, Plavix, etc., or if he/she have some genetic predisposition  such as hemophilia. Bleeding into the spinal canal can cause compression of the spinal  cord with subsequent paralysis.  This would require an emergency surgery to  decompress and there are no guarantees that the patient would recover from the  paralysis.       5. Pneumothorax:  Puncturing of a lung is a possibility, every time a needle is introduced in  the area of the chest or upper back.  Pneumothorax refers to free air around the  collapsed lung(s), inside of the thoracic cavity (chest cavity).  Another two possible  complications related to a similar event would include: Hemothorax and Chylothorax.   These are variations of the Pneumothorax, where instead of air around the collapsed  lung(s), you may have blood or chyle, respectively.       6. Spinal headaches: They may  occur with any procedures in the area of the spine.       7. Persistent CSF (Cerebro-Spinal Fluid) leakage: This is a rare problem, but may occur  with prolonged intrathecal or epidural catheters either due to the formation of a fistulous  track or a dural tear.       8. Nerve damage: By working so close to the spinal cord, there is always a possibility of  nerve damage, which could be as serious as a permanent spinal cord injury with  paralysis.       9. Death:  Although rare, severe deadly allergic reactions known as "Anaphylactic  reaction" can occur to any of the medications used.      10. Worsening of the symptoms:  We can always make thing worse.  What are the chances of something like this happening? Chances of any of this occuring are extremely low.  By statistics, you have more of a chance of  getting killed in a motor vehicle accident: while driving to the hospital than any of the above occurring .  Nevertheless, you should be aware that they are possibilities.  In general, it is similar to taking a shower.  Everybody knows that you can slip, hit your head and get killed.  Does that mean that you should not shower again?  Nevertheless always keep in mind that statistics do not mean anything if you happen to be on the wrong side of them.  Even if a procedure has a 1 (one) in a 1,000,000 (million) chance of going wrong, it you happen to be that one..Also, keep in mind that by statistics, you have more of a chance of having something go wrong when taking medications.  Who should not have this procedure? If you are on a blood thinning medication (e.g. Coumadin, Plavix, see list of "Blood Thinners"), or if you have an active infection going on, you should not have the procedure.  If you are taking any blood thinners, please inform your physician.  How should I prepare for this procedure?  Do not eat or drink anything at least six hours prior to the procedure.  Bring a driver with you .  It cannot be a taxi.  Come accompanied by an adult that can drive you back, and that is strong enough to help you if your legs get weak or numb from the local anesthetic.  Take all of your medicines the morning of the procedure with just enough water to swallow them.  If you have diabetes, make sure that you are scheduled to have your procedure done first thing in the morning, whenever possible.  If you have diabetes, take only half of your insulin dose and notify our nurse that you have done so as soon as you arrive at the clinic.  If you are diabetic, but only take blood sugar pills (oral hypoglycemic), then do not take them on the morning of your procedure.  You may take them after you have had the procedure.  Do not take aspirin or any aspirin-containing medications, at least eleven (11) days prior to  the procedure.  They may prolong bleeding.  Wear loose fitting clothing that may be easy to take off and that you would not mind if it got stained with Betadine or blood.  Do not wear any jewelry or perfume  Remove any nail coloring.  It will interfere with some of our monitoring equipment.  NOTE: Remember that this is not meant to be interpreted  as a complete list of all possible complications.  Unforeseen problems may occur.  BLOOD THINNERS The following drugs contain aspirin or other products, which can cause increased bleeding during surgery and should not be taken for 2 weeks prior to and 1 week after surgery.  If you should need take something for relief of minor pain, you may take acetaminophen which is found in Tylenol,m Datril, Anacin-3 and Panadol. It is not blood thinner. The products listed below are.  Do not take any of the products listed below in addition to any listed on your instruction sheet.  A.P.C or A.P.C with Codeine Codeine Phosphate Capsules #3 Ibuprofen Ridaura  ABC compound Congesprin Imuran rimadil  Advil Cope Indocin Robaxisal  Alka-Seltzer Effervescent Pain Reliever and Antacid Coricidin or Coricidin-D  Indomethacin Rufen  Alka-Seltzer plus Cold Medicine Cosprin Ketoprofen S-A-C Tablets  Anacin Analgesic Tablets or Capsules Coumadin Korlgesic Salflex  Anacin Extra Strength Analgesic tablets or capsules CP-2 Tablets Lanoril Salicylate  Anaprox Cuprimine Capsules Levenox Salocol  Anexsia-D Dalteparin Magan Salsalate  Anodynos Darvon compound Magnesium Salicylate Sine-off  Ansaid Dasin Capsules Magsal Sodium Salicylate  Anturane Depen Capsules Marnal Soma  APF Arthritis pain formula Dewitt's Pills Measurin Stanback  Argesic Dia-Gesic Meclofenamic Sulfinpyrazone  Arthritis Bayer Timed Release Aspirin Diclofenac Meclomen Sulindac  Arthritis pain formula Anacin Dicumarol Medipren Supac  Analgesic (Safety coated) Arthralgen Diffunasal Mefanamic Suprofen  Arthritis  Strength Bufferin Dihydrocodeine Mepro Compound Suprol  Arthropan liquid Dopirydamole Methcarbomol with Aspirin Synalgos  ASA tablets/Enseals Disalcid Micrainin Tagament  Ascriptin Doan's Midol Talwin  Ascriptin A/D Dolene Mobidin Tanderil  Ascriptin Extra Strength Dolobid Moblgesic Ticlid  Ascriptin with Codeine Doloprin or Doloprin with Codeine Momentum Tolectin  Asperbuf Duoprin Mono-gesic Trendar  Aspergum Duradyne Motrin or Motrin IB Triminicin  Aspirin plain, buffered or enteric coated Durasal Myochrisine Trigesic  Aspirin Suppositories Easprin Nalfon Trillsate  Aspirin with Codeine Ecotrin Regular or Extra Strength Naprosyn Uracel  Atromid-S Efficin Naproxen Ursinus  Auranofin Capsules Elmiron Neocylate Vanquish  Axotal Emagrin Norgesic Verin  Azathioprine Empirin or Empirin with Codeine Normiflo Vitamin E  Azolid Emprazil Nuprin Voltaren  Bayer Aspirin plain, buffered or children's or timed BC Tablets or powders Encaprin Orgaran Warfarin Sodium  Buff-a-Comp Enoxaparin Orudis Zorpin  Buff-a-Comp with Codeine Equegesic Os-Cal-Gesic   Buffaprin Excedrin plain, buffered or Extra Strength Oxalid   Bufferin Arthritis Strength Feldene Oxphenbutazone   Bufferin plain or Extra Strength Feldene Capsules Oxycodone with Aspirin   Bufferin with Codeine Fenoprofen Fenoprofen Pabalate or Pabalate-SF   Buffets II Flogesic Panagesic   Buffinol plain or Extra Strength Florinal or Florinal with Codeine Panwarfarin   Buf-Tabs Flurbiprofen Penicillamine   Butalbital Compound Four-way cold tablets Penicillin   Butazolidin Fragmin Pepto-Bismol   Carbenicillin Geminisyn Percodan   Carna Arthritis Reliever Geopen Persantine   Carprofen Gold's salt Persistin   Chloramphenicol Goody's Phenylbutazone   Chloromycetin Haltrain Piroxlcam   Clmetidine heparin Plaquenil   Cllnoril Hyco-pap Ponstel   Clofibrate Hydroxy chloroquine Propoxyphen         Before stopping any of these medications, be sure to  consult the physician who ordered them.  Some, such as Coumadin (Warfarin) are ordered to prevent or treat serious conditions such as "deep thrombosis", "pumonary embolisms", and other heart problems.  The amount of time that you may need off of the medication may also vary with the medication and the reason for which you were taking it.  If you are taking any of these medications, please make sure you notify your pain physician  before you undergo any procedures.         GENERAL RISKS AND COMPLICATIONS  What are the risk, side effects and possible complications? Generally speaking, most procedures are safe.  However, with any procedure there are risks, side effects, and the possibility of complications.  The risks and complications are dependent upon the sites that are lesioned, or the type of nerve block to be performed.  The closer the procedure is to the spine, the more serious the risks are.  Great care is taken when placing the radio frequency needles, block needles or lesioning probes, but sometimes complications can occur. 2. Infection: Any time there is an injection through the skin, there is a risk of infection.  This is why sterile conditions are used for these blocks.  There are four possible types of infection. 1. Localized skin infection. 2. Central Nervous System Infection-This can be in the form of Meningitis, which can be deadly. 3. Epidural Infections-This can be in the form of an epidural abscess, which can cause pressure inside of the spine, causing compression of the spinal cord with subsequent paralysis. This would require an emergency surgery to decompress, and there are no guarantees that the patient would recover from the paralysis. 4. Discitis-This is an infection of the intervertebral discs.  It occurs in about 1% of discography procedures.  It is difficult to treat and it may lead to surgery.        2. Pain: the needles have to go through skin and soft tissues, will  cause soreness.       3. Damage to internal structures:  The nerves to be lesioned may be near blood vessels or    other nerves which can be potentially damaged.       4. Bleeding: Bleeding is more common if the patient is taking blood thinners such as  aspirin, Coumadin, Ticiid, Plavix, etc., or if he/she have some genetic predisposition  such as hemophilia. Bleeding into the spinal canal can cause compression of the spinal  cord with subsequent paralysis.  This would require an emergency surgery to  decompress and there are no guarantees that the patient would recover from the  paralysis.       5. Pneumothorax:  Puncturing of a lung is a possibility, every time a needle is introduced in  the area of the chest or upper back.  Pneumothorax refers to free air around the  collapsed lung(s), inside of the thoracic cavity (chest cavity).  Another two possible  complications related to a similar event would include: Hemothorax and Chylothorax.   These are variations of the Pneumothorax, where instead of air around the collapsed  lung(s), you may have blood or chyle, respectively.       6. Spinal headaches: They may occur with any procedures in the area of the spine.       7. Persistent CSF (Cerebro-Spinal Fluid) leakage: This is a rare problem, but may occur  with prolonged intrathecal or epidural catheters either due to the formation of a fistulous  track or a dural tear.       8. Nerve damage: By working so close to the spinal cord, there is always a possibility of  nerve damage, which could be as serious as a permanent spinal cord injury with  paralysis.       9. Death:  Although rare, severe deadly allergic reactions known as "Anaphylactic  reaction" can occur to any of the medications used.  10. Worsening of the symptoms:  We can always make thing worse.  What are the chances of something like this happening? Chances of any of this occuring are extremely low.  By statistics, you have more of a chance  of getting killed in a motor vehicle accident: while driving to the hospital than any of the above occurring .  Nevertheless, you should be aware that they are possibilities.  In general, it is similar to taking a shower.  Everybody knows that you can slip, hit your head and get killed.  Does that mean that you should not shower again?  Nevertheless always keep in mind that statistics do not mean anything if you happen to be on the wrong side of them.  Even if a procedure has a 1 (one) in a 1,000,000 (million) chance of going wrong, it you happen to be that one..Also, keep in mind that by statistics, you have more of a chance of having something go wrong when taking medications.  Who should not have this procedure? If you are on a blood thinning medication (e.g. Coumadin, Plavix, see list of "Blood Thinners"), or if you have an active infection going on, you should not have the procedure.  If you are taking any blood thinners, please inform your physician.  How should I prepare for this procedure?  Do not eat or drink anything at least six hours prior to the procedure.  Bring a driver with you .  It cannot be a taxi.  Come accompanied by an adult that can drive you back, and that is strong enough to help you if your legs get weak or numb from the local anesthetic.  Take all of your medicines the morning of the procedure with just enough water to swallow them.  If you have diabetes, make sure that you are scheduled to have your procedure done first thing in the morning, whenever possible.  If you have diabetes, take only half of your insulin dose and notify our nurse that you have done so as soon as you arrive at the clinic.  If you are diabetic, but only take blood sugar pills (oral hypoglycemic), then do not take them on the morning of your procedure.  You may take them after you have had the procedure.  Do not take aspirin or any aspirin-containing medications, at least eleven (11) days prior  to the procedure.  They may prolong bleeding.  Wear loose fitting clothing that may be easy to take off and that you would not mind if it got stained with Betadine or blood.  Do not wear any jewelry or perfume  Remove any nail coloring.  It will interfere with some of our monitoring equipment.  NOTE: Remember that this is not meant to be interpreted as a complete list of all possible complications.  Unforeseen problems may occur.  BLOOD THINNERS The following drugs contain aspirin or other products, which can cause increased bleeding during surgery and should not be taken for 2 weeks prior to and 1 week after surgery.  If you should need take something for relief of minor pain, you may take acetaminophen which is found in Tylenol,m Datril, Anacin-3 and Panadol. It is not blood thinner. The products listed below are.  Do not take any of the products listed below in addition to any listed on your instruction sheet.  A.P.C or A.P.C with Codeine Codeine Phosphate Capsules #3 Ibuprofen Ridaura  ABC compound Congesprin Imuran rimadil  Advil Cope Indocin Robaxisal  Alka-Seltzer Effervescent Pain Reliever and Antacid Coricidin or Coricidin-D  Indomethacin Rufen  Alka-Seltzer plus Cold Medicine Cosprin Ketoprofen S-A-C Tablets  Anacin Analgesic Tablets or Capsules Coumadin Korlgesic Salflex  Anacin Extra Strength Analgesic tablets or capsules CP-2 Tablets Lanoril Salicylate  Anaprox Cuprimine Capsules Levenox Salocol  Anexsia-D Dalteparin Magan Salsalate  Anodynos Darvon compound Magnesium Salicylate Sine-off  Ansaid Dasin Capsules Magsal Sodium Salicylate  Anturane Depen Capsules Marnal Soma  APF Arthritis pain formula Dewitt's Pills Measurin Stanback  Argesic Dia-Gesic Meclofenamic Sulfinpyrazone  Arthritis Bayer Timed Release Aspirin Diclofenac Meclomen Sulindac  Arthritis pain formula Anacin Dicumarol Medipren Supac  Analgesic (Safety coated) Arthralgen Diffunasal Mefanamic Suprofen   Arthritis Strength Bufferin Dihydrocodeine Mepro Compound Suprol  Arthropan liquid Dopirydamole Methcarbomol with Aspirin Synalgos  ASA tablets/Enseals Disalcid Micrainin Tagament  Ascriptin Doan's Midol Talwin  Ascriptin A/D Dolene Mobidin Tanderil  Ascriptin Extra Strength Dolobid Moblgesic Ticlid  Ascriptin with Codeine Doloprin or Doloprin with Codeine Momentum Tolectin  Asperbuf Duoprin Mono-gesic Trendar  Aspergum Duradyne Motrin or Motrin IB Triminicin  Aspirin plain, buffered or enteric coated Durasal Myochrisine Trigesic  Aspirin Suppositories Easprin Nalfon Trillsate  Aspirin with Codeine Ecotrin Regular or Extra Strength Naprosyn Uracel  Atromid-S Efficin Naproxen Ursinus  Auranofin Capsules Elmiron Neocylate Vanquish  Axotal Emagrin Norgesic Verin  Azathioprine Empirin or Empirin with Codeine Normiflo Vitamin E  Azolid Emprazil Nuprin Voltaren  Bayer Aspirin plain, buffered or children's or timed BC Tablets or powders Encaprin Orgaran Warfarin Sodium  Buff-a-Comp Enoxaparin Orudis Zorpin  Buff-a-Comp with Codeine Equegesic Os-Cal-Gesic   Buffaprin Excedrin plain, buffered or Extra Strength Oxalid   Bufferin Arthritis Strength Feldene Oxphenbutazone   Bufferin plain or Extra Strength Feldene Capsules Oxycodone with Aspirin   Bufferin with Codeine Fenoprofen Fenoprofen Pabalate or Pabalate-SF   Buffets II Flogesic Panagesic   Buffinol plain or Extra Strength Florinal or Florinal with Codeine Panwarfarin   Buf-Tabs Flurbiprofen Penicillamine   Butalbital Compound Four-way cold tablets Penicillin   Butazolidin Fragmin Pepto-Bismol   Carbenicillin Geminisyn Percodan   Carna Arthritis Reliever Geopen Persantine   Carprofen Gold's salt Persistin   Chloramphenicol Goody's Phenylbutazone   Chloromycetin Haltrain Piroxlcam   Clmetidine heparin Plaquenil   Cllnoril Hyco-pap Ponstel   Clofibrate Hydroxy chloroquine Propoxyphen         Before stopping any of these medications,  be sure to consult the physician who ordered them.  Some, such as Coumadin (Warfarin) are ordered to prevent or treat serious conditions such as "deep thrombosis", "pumonary embolisms", and other heart problems.  The amount of time that you may need off of the medication may also vary with the medication and the reason for which you were taking it.  If you are taking any of these medications, please make sure you notify your pain physician before you undergo any procedures.

## 2015-05-07 NOTE — Progress Notes (Signed)
Subjective:    Patient ID: Hannah [name], female    DOB: 02-25-1951, 65 y.o.   MRN: [id number]  HPI  The patient is a 65 year old female who returns to pain management for further evaluation and treatment of pain involving the region of the lower back and lower extremity region predominantly. The patient states the pain radiates to the lower extremity and is aggravated by standing and walking. We've discussed patient undergoing neurosurgical evaluation and we'll request neurosurgical evaluation once again today. The patient is without trauma change in events of daily living the call significant change in symptomatology. We will continue presently prescribed medications and will proceed with interventional treatment at time return appointment consisting of lumbosacral selective nerve root block. The patient was with understanding and in agreement with suggested treatment plan. The patient stated that the pain of the lower back region was aggravated with prolonged standing and walking. The patient also admitted to occasional numbness and tingling of the lower extremity. We will proceed with interventional treatment and neurosurgical evaluation as discussed and we'll consider additional modification of treatment pending follow-up evaluations. The patient was in agreement with suggested treatment plan.       Review of Systems     Objective:   Physical Exam  There was tenderness to palpation of paraspinal musculature region of the cervical region cervical facet region a mild degree with mild tenderness of the splenius capitis and occipitalis musculature regions.. Palpation over the thoracic facet thoracic paraspinal muscles region was attends to palpation of mild to moderate degree. No crepitus of the thoracic region was noted. The patient appeared to be with bilaterally equal grip strength and Tinel and Phalen's maneuver were without increase of pain of significant degree. No crepitus of the  thoracic region was noted. Palpation over the lumbar paraspinal must reason lumbar facet region was attends to palpation of moderate to moderately severe degree. There was moderate to moderately severe tenderness of the gluteal and piriformis musculature region. Straight leg raising was tolerates approximately 20 without a definite increase of pain with dorsiflexion noted. DTRs were difficult to elicit patient had difficulty relaxing. Palpation of the PSIS and PII S region reproduces moderate discomfort. No definite sensory deficit or dermatomal dystrophy detected. There was negative clonus negative Homans. Palpation of the greater trochanteric region and iliotibial band region reproduced moderate discomfort. Abdomen was nontender with no costovertebral tenderness noted.      Assessment & Plan:     Sacroiliac joint dysfunction  Degenerative disc disease lumbar spine Kyphotic curvature at the 1112 disc space, spondylosis and shallow disc bulge at this level resulting in mild canal and moderate bilateral foraminal stenosis and mild anterior and superior T12 upper endplate depression, lack of marrow edema indicative of chronic change with mild osseous and disc changes at the remaining levels with adrenal nodule. Disc desiccation central right with the central disc bulge resulting and right foraminal narrowing L3-4 level with moderate left and right facet arthrosis. L4-L5 disc desiccation. Shallow broad-based disc bulging and narrowing of the central canal and left foramen at L4-L5 level  Lumbar radiculopathy  Lumbar facet syndrome  Greater trochanteric bursitis    PLAN   Continue present medication hydrocodone acetaminophen  Lumbosacral selective nerve root block to be performed at time of return appointment  F/U PCP Dr. Ronette Deter for evaliation of  BP and general medical  condition   F/U surgical evaluation.   Ask secretaries and nurses the date of your neurosurgical  evaluation  F/U  neurological evaluation. May consider pending follow-up evaluations  F/U  psych evaluation  May consider radiofrequency rhizolysis or intraspinal procedures pending response to present treatment and F/U evaluation   Patient to call Pain Management Center should patient have concerns prior to scheduled return appointment

## 2015-05-09 LAB — HM COLONOSCOPY

## 2015-05-11 ENCOUNTER — Other Ambulatory Visit: Payer: Self-pay | Admitting: Pain Medicine

## 2015-05-11 ENCOUNTER — Other Ambulatory Visit: Payer: Self-pay | Admitting: Nurse Practitioner

## 2015-05-13 ENCOUNTER — Ambulatory Visit: Payer: BLUE CROSS/BLUE SHIELD | Attending: Pain Medicine | Admitting: Pain Medicine

## 2015-05-13 ENCOUNTER — Encounter: Payer: Self-pay | Admitting: Pain Medicine

## 2015-05-13 VITALS — BP 145/66 | HR 63 | Temp 98.2°F | Resp 14 | Ht 60.0 in | Wt 152.0 lb

## 2015-05-13 DIAGNOSIS — M4806 Spinal stenosis, lumbar region: Secondary | ICD-10-CM | POA: Diagnosis not present

## 2015-05-13 DIAGNOSIS — M5126 Other intervertebral disc displacement, lumbar region: Secondary | ICD-10-CM | POA: Insufficient documentation

## 2015-05-13 DIAGNOSIS — M5136 Other intervertebral disc degeneration, lumbar region: Secondary | ICD-10-CM | POA: Diagnosis not present

## 2015-05-13 DIAGNOSIS — M7060 Trochanteric bursitis, unspecified hip: Secondary | ICD-10-CM

## 2015-05-13 DIAGNOSIS — M79604 Pain in right leg: Secondary | ICD-10-CM

## 2015-05-13 DIAGNOSIS — M79606 Pain in leg, unspecified: Secondary | ICD-10-CM | POA: Diagnosis present

## 2015-05-13 DIAGNOSIS — G5790 Unspecified mononeuropathy of unspecified lower limb: Secondary | ICD-10-CM

## 2015-05-13 DIAGNOSIS — M47816 Spondylosis without myelopathy or radiculopathy, lumbar region: Secondary | ICD-10-CM

## 2015-05-13 DIAGNOSIS — M5416 Radiculopathy, lumbar region: Secondary | ICD-10-CM

## 2015-05-13 DIAGNOSIS — M48062 Spinal stenosis, lumbar region with neurogenic claudication: Secondary | ICD-10-CM

## 2015-05-13 DIAGNOSIS — M533 Sacrococcygeal disorders, not elsewhere classified: Secondary | ICD-10-CM

## 2015-05-13 DIAGNOSIS — R41840 Attention and concentration deficit: Secondary | ICD-10-CM

## 2015-05-13 DIAGNOSIS — M51369 Other intervertebral disc degeneration, lumbar region without mention of lumbar back pain or lower extremity pain: Secondary | ICD-10-CM

## 2015-05-13 DIAGNOSIS — M545 Low back pain: Secondary | ICD-10-CM | POA: Diagnosis present

## 2015-05-13 MED ORDER — LIDOCAINE HCL (PF) 1 % IJ SOLN: 10.0000 mL | Freq: Once | INTRAMUSCULAR | Status: DC

## 2015-05-13 MED ORDER — LACTATED RINGERS IV SOLN: 1000.0000 mL | INTRAVENOUS | Status: DC

## 2015-05-13 MED ORDER — CEFAZOLIN SODIUM 1-5 GM-% IV SOLN: 1.0000 g | Freq: Once | INTRAVENOUS | Status: DC

## 2015-05-13 MED ORDER — FENTANYL CITRATE (PF) 100 MCG/2ML IJ SOLN: 100.0000 ug | Freq: Once | INTRAMUSCULAR | Status: DC

## 2015-05-13 MED ORDER — TRIAMCINOLONE ACETONIDE 40 MG/ML IJ SUSP
INTRAMUSCULAR | Status: AC
  Administered 2015-05-13: 11:00:00
  Filled 2015-05-13: qty 1

## 2015-05-13 MED ORDER — CEFAZOLIN SODIUM 1 G IJ SOLR
INTRAMUSCULAR | Status: AC
  Administered 2015-05-13: 1 g via INTRAVENOUS
  Filled 2015-05-13: qty 10

## 2015-05-13 MED ORDER — BUPIVACAINE HCL (PF) 0.25 % IJ SOLN
INTRAMUSCULAR | Status: AC
  Filled 2015-05-13: qty 30

## 2015-05-13 MED ORDER — BUPIVACAINE HCL (PF) 0.25 % IJ SOLN: 30.0000 mL | Freq: Once | INTRAMUSCULAR | Status: DC

## 2015-05-13 MED ORDER — SODIUM CHLORIDE 0.9 % IJ SOLN
INTRAMUSCULAR | Status: AC
  Administered 2015-05-13: 11:00:00
  Filled 2015-05-13: qty 10

## 2015-05-13 MED ORDER — ORPHENADRINE CITRATE 30 MG/ML IJ SOLN: 60.0000 mg | Freq: Once | INTRAMUSCULAR | Status: DC

## 2015-05-13 MED ORDER — MIDAZOLAM HCL 5 MG/5ML IJ SOLN
INTRAMUSCULAR | Status: AC
  Administered 2015-05-13: 3 mg via INTRAVENOUS
  Filled 2015-05-13: qty 5

## 2015-05-13 MED ORDER — MIDAZOLAM HCL 5 MG/5ML IJ SOLN: 5.0000 mg | Freq: Once | INTRAMUSCULAR | Status: DC

## 2015-05-13 MED ORDER — ORPHENADRINE CITRATE 30 MG/ML IJ SOLN
INTRAMUSCULAR | Status: DC
Start: 2015-05-13 — End: 2015-05-13
  Filled 2015-05-13: qty 2

## 2015-05-13 MED ORDER — TRIAMCINOLONE ACETONIDE 40 MG/ML IJ SUSP: 40.0000 mg | Freq: Once | INTRAMUSCULAR | Status: DC

## 2015-05-13 MED ORDER — FENTANYL CITRATE (PF) 100 MCG/2ML IJ SOLN
INTRAMUSCULAR | Status: AC
  Administered 2015-05-13: 50 ug via INTRAVENOUS
  Filled 2015-05-13: qty 2

## 2015-05-13 NOTE — Patient Instructions (Addendum)
PLAN   Continue present medication hydrocodone acetaminophen . Please get Ceftin antibiotic today and begin taking Ceftin antibiotic as prescribed  F/U PCP Dr. Ronette Deter for evaliation of  BP and general medical  condition   F/U surgical evaluation. May consider pending follow-up evaluations  F/U neurological evaluation. May consider pending follow-up evaluations  F/U  psych evaluation  May consider radiofrequency rhizolysis or intraspinal procedures pending response to present treatment and F/U evaluation   Patient to call Pain Management Center should patient have concerns prior to scheduled return appointment  Pain Management Discharge Instructions  General Discharge Instructions :  If you need to reach your doctor call: Monday-Friday 8:00 am - 4:00 pm at [phone] or toll free [phone].  After clinic hours [phone] to have operator reach doctor.  Bring all of your medication bottles to all your appointments in the pain clinic.  To cancel or reschedule your appointment with Pain Management please remember to call 24 hours in advance to avoid a fee.  Refer to the educational materials which you have been given on: General Risks, I had my Procedure. Discharge Instructions, Post Sedation.  Post Procedure Instructions:  The drugs you were given will stay in your system until tomorrow, so for the next 24 hours you should not drive, make any legal decisions or drink any alcoholic beverages.  You may eat anything you prefer, but it is better to start with liquids then soups and crackers, and gradually work up to solid foods.  Please notify your doctor immediately if you have any unusual bleeding, trouble breathing or pain that is not related to your normal pain.  Depending on the type of procedure that was done, some parts of your body may feel week and/or numb.  This usually clears up by tonight or the next day.  Walk with the use of an assistive device or  accompanied by an adult for the 24 hours.  You may use ice on the affected area for the first 24 hours.  Put ice in a Ziploc bag and cover with a towel and place against area 15 minutes on 15 minutes off.  You may switch to heat after 24 hours.

## 2015-05-13 NOTE — Progress Notes (Signed)
Subjective:    Patient ID: Hannah [name], female    DOB: 1950/03/30, 65 y.o.   MRN: [id number]  HPI  PROCEDURE PERFORMED: Lumbosacral selective nerve root block   NOTE: The patient is a 65 y.o. female who returns to [facility] for further evaluation and treatment of pain involving the lumbar and lower extremity region. Studies consisting of MRI has revealed the patient to be with evidence of Degenerative disc disease lumbar spine Kyphotic curvature at the 1112 disc space, spondylosis and shallow disc bulge at this level resulting in mild canal and moderate bilateral foraminal stenosis and mild anterior and superior T12 upper endplate depression, lack of marrow edema indicative of chronic change with mild osseous and disc changes at the remaining levels with adrenal nodule. Disc desiccation central right with the central disc bulge resulting and right foraminal narrowing L3-4 level with moderate left and right facet arthrosis. L4-L5 disc desiccation. Shallow broad-based disc bulging and narrowing of the central canal and left foramen at L4-L5 level. There is concern regarding intraspinal abnormalities contributing to patient's symptomatology with significant component of pain due to lumbar radiculopathy.. The risks, benefits, and expectations of the procedure have been explained to the patient who was understanding and in agreement with suggested treatment plan. We will proceed with interventional treatment as discussed and as explained to the patient. The patient is understanding and in agreement with suggested treatment plan.   DESCRIPTION OF PROCEDURE: Lumbosacral selective nerve root block with IV Versed, IV fentanyl conscious sedation, EKG, blood pressure, pulse, and pulse oximetry monitoring. The procedure was performed with the patient in the prone position under fluoroscopic guidance. With the patient in the prone position, Betadine prep of proposed entry site was performed. Local  anesthetic skin wheal of proposed needle entry site was prepared with 1.5% plain lidocaine with AP view of the lumbosacral spine.   PROCEDURE #1: Needle placement at the right  L 2 vertebral body: A 22 -gauge needle was inserted at the inferior border of the transverse process of the vertebral body with needle placed medial to the midline of the transverse process on AP view of the lumbosacral spine.   NEEDLE PLACEMENT AT  L3, L4, and L5  VERTEBRAL BODY LEVELS  Needle  placement was accomplished at L3, L4, and L5  vertebral body levels on the right side exactly as was accomplished at the L2  vertebral body level  and utilizing the same technique and under fluoroscopic guidance.   Needle placement was then verified on lateral view at all levels with needle tip documented to be in the posterior superior quadrant of the intervertebral foramen of  L 2, L3, L4, and L5. Following negative aspiration for heme and CSF at each level, each level was injected with 3 mL of preservative-free normal saline with Kenalog.   The patient tolerated the procedure well. A total of 10 mg of Kenalog was utilized for the procedure.   PLAN:  1. Medications: Will continue presently prescribed medication hydrocodone acetaminophen. 2. The patient is to undergo follow-up evaluation with PCP Dr. Ronette Deter for evaluation of blood pressure and general medical condition status post procedure performed on today's visit. 3. Surgical follow-up evaluation. Has been addressed 4. Neurological evaluation. Has been addressed 5. May consider radiofrequency procedures, implantation type procedures and other treatment pending response to treatment and follow-up evaluation. 6. The patient has been advise do adhere to proper body mechanics and avoid activities which may aggravate condition. 7. The patient has  been advised to call the Pain Management Center prior to scheduled return appointment should there be significant change in  the patient's condition or should the patient have other concerns regarding condition prior to scheduled return appointment.   Review of Systems     Objective:   Physical Exam        Assessment & Plan:

## 2015-05-14 ENCOUNTER — Telehealth: Payer: Self-pay | Admitting: *Deleted

## 2015-05-14 NOTE — Telephone Encounter (Signed)
No problems post procedure. 

## 2015-05-22 ENCOUNTER — Other Ambulatory Visit: Payer: Self-pay | Admitting: Pain Medicine

## 2015-05-24 ENCOUNTER — Encounter: Payer: Self-pay | Admitting: Internal Medicine

## 2015-06-04 ENCOUNTER — Ambulatory Visit: Payer: BLUE CROSS/BLUE SHIELD | Attending: Pain Medicine | Admitting: Pain Medicine

## 2015-06-04 ENCOUNTER — Encounter: Payer: Self-pay | Admitting: Pain Medicine

## 2015-06-04 VITALS — BP 110/63 | HR 76 | Temp 98.0°F | Resp 16 | Ht 60.0 in | Wt 152.0 lb

## 2015-06-04 DIAGNOSIS — M5126 Other intervertebral disc displacement, lumbar region: Secondary | ICD-10-CM | POA: Diagnosis not present

## 2015-06-04 DIAGNOSIS — M79659 Pain in unspecified thigh: Secondary | ICD-10-CM

## 2015-06-04 DIAGNOSIS — M47814 Spondylosis without myelopathy or radiculopathy, thoracic region: Secondary | ICD-10-CM | POA: Diagnosis not present

## 2015-06-04 DIAGNOSIS — M533 Sacrococcygeal disorders, not elsewhere classified: Secondary | ICD-10-CM | POA: Diagnosis not present

## 2015-06-04 DIAGNOSIS — M40294 Other kyphosis, thoracic region: Secondary | ICD-10-CM | POA: Diagnosis not present

## 2015-06-04 DIAGNOSIS — M5416 Radiculopathy, lumbar region: Secondary | ICD-10-CM

## 2015-06-04 DIAGNOSIS — M545 Low back pain: Secondary | ICD-10-CM | POA: Diagnosis present

## 2015-06-04 DIAGNOSIS — M7060 Trochanteric bursitis, unspecified hip: Secondary | ICD-10-CM | POA: Insufficient documentation

## 2015-06-04 DIAGNOSIS — M79606 Pain in leg, unspecified: Secondary | ICD-10-CM | POA: Diagnosis present

## 2015-06-04 DIAGNOSIS — M5116 Intervertebral disc disorders with radiculopathy, lumbar region: Secondary | ICD-10-CM | POA: Diagnosis not present

## 2015-06-04 DIAGNOSIS — M47816 Spondylosis without myelopathy or radiculopathy, lumbar region: Secondary | ICD-10-CM

## 2015-06-04 DIAGNOSIS — M48062 Spinal stenosis, lumbar region with neurogenic claudication: Secondary | ICD-10-CM

## 2015-06-04 DIAGNOSIS — M79604 Pain in right leg: Secondary | ICD-10-CM

## 2015-06-04 DIAGNOSIS — G5790 Unspecified mononeuropathy of unspecified lower limb: Secondary | ICD-10-CM

## 2015-06-04 DIAGNOSIS — M5136 Other intervertebral disc degeneration, lumbar region: Secondary | ICD-10-CM

## 2015-06-04 MED ORDER — HYDROCODONE-ACETAMINOPHEN 5-325 MG PO TABS: ORAL_TABLET | ORAL | Status: DC

## 2015-06-04 NOTE — Progress Notes (Signed)
Safety precautions to be maintained throughout the outpatient stay will include: orient to surroundings, keep bed in low position, maintain call bell within reach at all times, provide assistance with transfer out of bed and ambulation.  

## 2015-06-04 NOTE — Patient Instructions (Addendum)
PLAN   Continue present medication hydrocodone acetaminophen  Lumbosacral selective nerve root block to be performed at time of return appointment  F/U PCP Dr. Ronette Deter for evaliation of  BP and general medical  condition   F/U surgical evaluation.   Ask secretaries and nurses the date of your neurosurgical evaluation as we previously discussed You need to have neurosurgical evaluation of lower back and lower extremity pain anesthesia's and weakness  F/U neurological evaluation. May consider pending follow-up evaluations  F/U  psych evaluation  May consider radiofrequency rhizolysis or intraspinal procedures pending response to present treatment and F/U evaluation   [facility] to call [facility] should patient have concerns prior to scheduled return appointmentGENERAL RISKS AND COMPLICATIONS  What are the risk, side effects and possible complications? Generally speaking, most procedures are safe.  However, with any procedure there are risks, side effects, and the possibility of complications.  The risks and complications are dependent upon the sites that are lesioned, or the type of nerve block to be performed.  The closer the procedure is to the spine, the more serious the risks are.  Great care is taken when placing the radio frequency needles, block needles or lesioning probes, but sometimes complications can occur. 1. Infection: Any time there is an injection through the skin, there is a risk of infection.  This is why sterile conditions are used for these blocks.  There are four possible types of infection. 1. Localized skin infection. 2. Central Nervous System Infection-This can be in the form of Meningitis, which can be deadly. 3. Epidural Infections-This can be in the form of an epidural abscess, which can cause pressure inside of the spine, causing compression of the spinal cord with subsequent paralysis. This would require an emergency surgery to decompress, and  there are no guarantees that the patient would recover from the paralysis. 4. Discitis-This is an infection of the intervertebral discs.  It occurs in about 1% of discography procedures.  It is difficult to treat and it may lead to surgery.        2. Pain: the needles have to go through skin and soft tissues, will cause soreness.       3. Damage to internal structures:  The nerves to be lesioned may be near blood vessels or    other nerves which can be potentially damaged.       4. Bleeding: Bleeding is more common if the patient is taking blood thinners such as  aspirin, Coumadin, Ticiid, Plavix, etc., or if he/she have some genetic predisposition  such as hemophilia. Bleeding into the spinal canal can cause compression of the spinal  cord with subsequent paralysis.  This would require an emergency surgery to  decompress and there are no guarantees that the patient would recover from the  paralysis.       5. Pneumothorax:  Puncturing of a lung is a possibility, every time a needle is introduced in  the area of the chest or upper back.  Pneumothorax refers to free air around the  collapsed lung(s), inside of the thoracic cavity (chest cavity).  Another two possible  complications related to a similar event would include: Hemothorax and Chylothorax.   These are variations of the Pneumothorax, where instead of air around the collapsed  lung(s), you may have blood or chyle, respectively.       6. Spinal headaches: They may occur with any procedures in the area of the spine.  7. Persistent CSF (Cerebro-Spinal Fluid) leakage: This is a rare problem, but may occur  with prolonged intrathecal or epidural catheters either due to the formation of a fistulous  track or a dural tear.       8. Nerve damage: By working so close to the spinal cord, there is always a possibility of  nerve damage, which could be as serious as a permanent spinal cord injury with  paralysis.       9. Death:  Although rare, severe  deadly allergic reactions known as "Anaphylactic  reaction" can occur to any of the medications used.      10. Worsening of the symptoms:  We can always make thing worse.  What are the chances of something like this happening? Chances of any of this occuring are extremely low.  By statistics, you have more of a chance of getting killed in a motor vehicle accident: while driving to the hospital than any of the above occurring .  Nevertheless, you should be aware that they are possibilities.  In general, it is similar to taking a shower.  Everybody knows that you can slip, hit your head and get killed.  Does that mean that you should not shower again?  Nevertheless always keep in mind that statistics do not mean anything if you happen to be on the wrong side of them.  Even if a procedure has a 1 (one) in a 1,000,000 (million) chance of going wrong, it you happen to be that one..Also, keep in mind that by statistics, you have more of a chance of having something go wrong when taking medications.  Who should not have this procedure? If you are on a blood thinning medication (e.g. Coumadin, Plavix, see list of "Blood Thinners"), or if you have an active infection going on, you should not have the procedure.  If you are taking any blood thinners, please inform your physician.  How should I prepare for this procedure?  Do not eat or drink anything at least six hours prior to the procedure.  Bring a driver with you .  It cannot be a taxi.  Come accompanied by an adult that can drive you back, and that is strong enough to help you if your legs get weak or numb from the local anesthetic.  Take all of your medicines the morning of the procedure with just enough water to swallow them.  If you have diabetes, make sure that you are scheduled to have your procedure done first thing in the morning, whenever possible.  If you have diabetes, take only half of your insulin dose and notify our nurse that you have  done so as soon as you arrive at the clinic.  If you are diabetic, but only take blood sugar pills (oral hypoglycemic), then do not take them on the morning of your procedure.  You may take them after you have had the procedure.  Do not take aspirin or any aspirin-containing medications, at least eleven (11) days prior to the procedure.  They may prolong bleeding.  Wear loose fitting clothing that may be easy to take off and that you would not mind if it got stained with Betadine or blood.  Do not wear any jewelry or perfume  Remove any nail coloring.  It will interfere with some of our monitoring equipment.  NOTE: Remember that this is not meant to be interpreted as a complete list of all possible complications.  Unforeseen problems may occur.  BLOOD THINNERS  The following drugs contain aspirin or other products, which can cause increased bleeding during surgery and should not be taken for 2 weeks prior to and 1 week after surgery.  If you should need take something for relief of minor pain, you may take acetaminophen which is found in Tylenol,m Datril, Anacin-3 and Panadol. It is not blood thinner. The products listed below are.  Do not take any of the products listed below in addition to any listed on your instruction sheet.  A.P.C or A.P.C with Codeine Codeine Phosphate Capsules #3 Ibuprofen Ridaura  ABC compound Congesprin Imuran rimadil  Advil Cope Indocin Robaxisal  Alka-Seltzer Effervescent Pain Reliever and Antacid Coricidin or Coricidin-D  Indomethacin Rufen  Alka-Seltzer plus Cold Medicine Cosprin Ketoprofen S-A-C Tablets  Anacin Analgesic Tablets or Capsules Coumadin Korlgesic Salflex  Anacin Extra Strength Analgesic tablets or capsules CP-2 Tablets Lanoril Salicylate  Anaprox Cuprimine Capsules Levenox Salocol  Anexsia-D Dalteparin Magan Salsalate  Anodynos Darvon compound Magnesium Salicylate Sine-off  Ansaid Dasin Capsules Magsal Sodium Salicylate  Anturane Depen Capsules  Marnal Soma  APF Arthritis pain formula Dewitt's Pills Measurin Stanback  Argesic Dia-Gesic Meclofenamic Sulfinpyrazone  Arthritis Bayer Timed Release Aspirin Diclofenac Meclomen Sulindac  Arthritis pain formula Anacin Dicumarol Medipren Supac  Analgesic (Safety coated) Arthralgen Diffunasal Mefanamic Suprofen  Arthritis Strength Bufferin Dihydrocodeine Mepro Compound Suprol  Arthropan liquid Dopirydamole Methcarbomol with Aspirin Synalgos  ASA tablets/Enseals Disalcid Micrainin Tagament  Ascriptin Doan's Midol Talwin  Ascriptin A/D Dolene Mobidin Tanderil  Ascriptin Extra Strength Dolobid Moblgesic Ticlid  Ascriptin with Codeine Doloprin or Doloprin with Codeine Momentum Tolectin  Asperbuf Duoprin Mono-gesic Trendar  Aspergum Duradyne Motrin or Motrin IB Triminicin  Aspirin plain, buffered or enteric coated Durasal Myochrisine Trigesic  Aspirin Suppositories Easprin Nalfon Trillsate  Aspirin with Codeine Ecotrin Regular or Extra Strength Naprosyn Uracel  Atromid-S Efficin Naproxen Ursinus  Auranofin Capsules Elmiron Neocylate Vanquish  Axotal Emagrin Norgesic Verin  Azathioprine Empirin or Empirin with Codeine Normiflo Vitamin E  Azolid Emprazil Nuprin Voltaren  Bayer Aspirin plain, buffered or children's or timed BC Tablets or powders Encaprin Orgaran Warfarin Sodium  Buff-a-Comp Enoxaparin Orudis Zorpin  Buff-a-Comp with Codeine Equegesic Os-Cal-Gesic   Buffaprin Excedrin plain, buffered or Extra Strength Oxalid   Bufferin Arthritis Strength Feldene Oxphenbutazone   Bufferin plain or Extra Strength Feldene Capsules Oxycodone with Aspirin   Bufferin with Codeine Fenoprofen Fenoprofen Pabalate or Pabalate-SF   Buffets II Flogesic Panagesic   Buffinol plain or Extra Strength Florinal or Florinal with Codeine Panwarfarin   Buf-Tabs Flurbiprofen Penicillamine   Butalbital Compound Four-way cold tablets Penicillin   Butazolidin Fragmin Pepto-Bismol   Carbenicillin Geminisyn Percodan    Carna Arthritis Reliever Geopen Persantine   Carprofen Gold's salt Persistin   Chloramphenicol Goody's Phenylbutazone   Chloromycetin Haltrain Piroxlcam   Clmetidine heparin Plaquenil   Cllnoril Hyco-pap Ponstel   Clofibrate Hydroxy chloroquine Propoxyphen         Before stopping any of these medications, be sure to consult the physician who ordered them.  Some, such as Coumadin (Warfarin) are ordered to prevent or treat serious conditions such as "deep thrombosis", "pumonary embolisms", and other heart problems.  The amount of time that you may need off of the medication may also vary with the medication and the reason for which you were taking it.  If you are taking any of these medications, please make sure you notify your pain physician before you undergo any procedures.         GENERAL RISKS AND  COMPLICATIONS  What are the risk, side effects and possible complications? Generally speaking, most procedures are safe.  However, with any procedure there are risks, side effects, and the possibility of complications.  The risks and complications are dependent upon the sites that are lesioned, or the type of nerve block to be performed.  The closer the procedure is to the spine, the more serious the risks are.  Great care is taken when placing the radio frequency needles, block needles or lesioning probes, but sometimes complications can occur. 2. Infection: Any time there is an injection through the skin, there is a risk of infection.  This is why sterile conditions are used for these blocks.  There are four possible types of infection. 1. Localized skin infection. 2. Central Nervous System Infection-This can be in the form of Meningitis, which can be deadly. 3. Epidural Infections-This can be in the form of an epidural abscess, which can cause pressure inside of the spine, causing compression of the spinal cord with subsequent paralysis. This would require an emergency surgery to  decompress, and there are no guarantees that the patient would recover from the paralysis. 4. Discitis-This is an infection of the intervertebral discs.  It occurs in about 1% of discography procedures.  It is difficult to treat and it may lead to surgery.        2. Pain: the needles have to go through skin and soft tissues, will cause soreness.       3. Damage to internal structures:  The nerves to be lesioned may be near blood vessels or    other nerves which can be potentially damaged.       4. Bleeding: Bleeding is more common if the patient is taking blood thinners such as  aspirin, Coumadin, Ticiid, Plavix, etc., or if he/she have some genetic predisposition  such as hemophilia. Bleeding into the spinal canal can cause compression of the spinal  cord with subsequent paralysis.  This would require an emergency surgery to  decompress and there are no guarantees that the patient would recover from the  paralysis.       5. Pneumothorax:  Puncturing of a lung is a possibility, every time a needle is introduced in  the area of the chest or upper back.  Pneumothorax refers to free air around the  collapsed lung(s), inside of the thoracic cavity (chest cavity).  Another two possible  complications related to a similar event would include: Hemothorax and Chylothorax.   These are variations of the Pneumothorax, where instead of air around the collapsed  lung(s), you may have blood or chyle, respectively.       6. Spinal headaches: They may occur with any procedures in the area of the spine.       7. Persistent CSF (Cerebro-Spinal Fluid) leakage: This is a rare problem, but may occur  with prolonged intrathecal or epidural catheters either due to the formation of a fistulous  track or a dural tear.       8. Nerve damage: By working so close to the spinal cord, there is always a possibility of  nerve damage, which could be as serious as a permanent spinal cord injury with  paralysis.       9. Death:  Although  rare, severe deadly allergic reactions known as "Anaphylactic  reaction" can occur to any of the medications used.      10. Worsening of the symptoms:  We can always make thing worse.  What  are the chances of something like this happening? Chances of any of this occuring are extremely low.  By statistics, you have more of a chance of getting killed in a motor vehicle accident: while driving to the hospital than any of the above occurring .  Nevertheless, you should be aware that they are possibilities.  In general, it is similar to taking a shower.  Everybody knows that you can slip, hit your head and get killed.  Does that mean that you should not shower again?  Nevertheless always keep in mind that statistics do not mean anything if you happen to be on the wrong side of them.  Even if a procedure has a 1 (one) in a 1,000,000 (million) chance of going wrong, it you happen to be that one..Also, keep in mind that by statistics, you have more of a chance of having something go wrong when taking medications.  Who should not have this procedure? If you are on a blood thinning medication (e.g. Coumadin, Plavix, see list of "Blood Thinners"), or if you have an active infection going on, you should not have the procedure.  If you are taking any blood thinners, please inform your physician.  How should I prepare for this procedure?  Do not eat or drink anything at least six hours prior to the procedure.  Bring a driver with you .  It cannot be a taxi.  Come accompanied by an adult that can drive you back, and that is strong enough to help you if your legs get weak or numb from the local anesthetic.  Take all of your medicines the morning of the procedure with just enough water to swallow them.  If you have diabetes, make sure that you are scheduled to have your procedure done first thing in the morning, whenever possible.  If you have diabetes, take only half of your insulin dose and notify our nurse  that you have done so as soon as you arrive at the clinic.  If you are diabetic, but only take blood sugar pills (oral hypoglycemic), then do not take them on the morning of your procedure.  You may take them after you have had the procedure.  Do not take aspirin or any aspirin-containing medications, at least eleven (11) days prior to the procedure.  They may prolong bleeding.  Wear loose fitting clothing that may be easy to take off and that you would not mind if it got stained with Betadine or blood.  Do not wear any jewelry or perfume  Remove any nail coloring.  It will interfere with some of our monitoring equipment.  NOTE: Remember that this is not meant to be interpreted as a complete list of all possible complications.  Unforeseen problems may occur.  BLOOD THINNERS The following drugs contain aspirin or other products, which can cause increased bleeding during surgery and should not be taken for 2 weeks prior to and 1 week after surgery.  If you should need take something for relief of minor pain, you may take acetaminophen which is found in Tylenol,m Datril, Anacin-3 and Panadol. It is not blood thinner. The products listed below are.  Do not take any of the products listed below in addition to any listed on your instruction sheet.  A.P.C or A.P.C with Codeine Codeine Phosphate Capsules #3 Ibuprofen Ridaura  ABC compound Congesprin Imuran rimadil  Advil Cope Indocin Robaxisal  Alka-Seltzer Effervescent Pain Reliever and Antacid Coricidin or Coricidin-D  Indomethacin Rufen  Alka-Seltzer plus  Cold Medicine Cosprin Ketoprofen S-A-C Tablets  Anacin Analgesic Tablets or Capsules Coumadin Korlgesic Salflex  Anacin Extra Strength Analgesic tablets or capsules CP-2 Tablets Lanoril Salicylate  Anaprox Cuprimine Capsules Levenox Salocol  Anexsia-D Dalteparin Magan Salsalate  Anodynos Darvon compound Magnesium Salicylate Sine-off  Ansaid Dasin Capsules Magsal Sodium Salicylate  Anturane  Depen Capsules Marnal Soma  APF Arthritis pain formula Dewitt's Pills Measurin Stanback  Argesic Dia-Gesic Meclofenamic Sulfinpyrazone  Arthritis Bayer Timed Release Aspirin Diclofenac Meclomen Sulindac  Arthritis pain formula Anacin Dicumarol Medipren Supac  Analgesic (Safety coated) Arthralgen Diffunasal Mefanamic Suprofen  Arthritis Strength Bufferin Dihydrocodeine Mepro Compound Suprol  Arthropan liquid Dopirydamole Methcarbomol with Aspirin Synalgos  ASA tablets/Enseals Disalcid Micrainin Tagament  Ascriptin Doan's Midol Talwin  Ascriptin A/D Dolene Mobidin Tanderil  Ascriptin Extra Strength Dolobid Moblgesic Ticlid  Ascriptin with Codeine Doloprin or Doloprin with Codeine Momentum Tolectin  Asperbuf Duoprin Mono-gesic Trendar  Aspergum Duradyne Motrin or Motrin IB Triminicin  Aspirin plain, buffered or enteric coated Durasal Myochrisine Trigesic  Aspirin Suppositories Easprin Nalfon Trillsate  Aspirin with Codeine Ecotrin Regular or Extra Strength Naprosyn Uracel  Atromid-S Efficin Naproxen Ursinus  Auranofin Capsules Elmiron Neocylate Vanquish  Axotal Emagrin Norgesic Verin  Azathioprine Empirin or Empirin with Codeine Normiflo Vitamin E  Azolid Emprazil Nuprin Voltaren  Bayer Aspirin plain, buffered or children's or timed BC Tablets or powders Encaprin Orgaran Warfarin Sodium  Buff-a-Comp Enoxaparin Orudis Zorpin  Buff-a-Comp with Codeine Equegesic Os-Cal-Gesic   Buffaprin Excedrin plain, buffered or Extra Strength Oxalid   Bufferin Arthritis Strength Feldene Oxphenbutazone   Bufferin plain or Extra Strength Feldene Capsules Oxycodone with Aspirin   Bufferin with Codeine Fenoprofen Fenoprofen Pabalate or Pabalate-SF   Buffets II Flogesic Panagesic   Buffinol plain or Extra Strength Florinal or Florinal with Codeine Panwarfarin   Buf-Tabs Flurbiprofen Penicillamine   Butalbital Compound Four-way cold tablets Penicillin   Butazolidin Fragmin Pepto-Bismol   Carbenicillin  Geminisyn Percodan   Carna Arthritis Reliever Geopen Persantine   Carprofen Gold's salt Persistin   Chloramphenicol Goody's Phenylbutazone   Chloromycetin Haltrain Piroxlcam   Clmetidine heparin Plaquenil   Cllnoril Hyco-pap Ponstel   Clofibrate Hydroxy chloroquine Propoxyphen         Before stopping any of these medications, be sure to consult the physician who ordered them.  Some, such as Coumadin (Warfarin) are ordered to prevent or treat serious conditions such as "deep thrombosis", "pumonary embolisms", and other heart problems.  The amount of time that you may need off of the medication may also vary with the medication and the reason for which you were taking it.  If you are taking any of these medications, please make sure you notify your pain physician before you undergo any procedures.

## 2015-06-04 NOTE — Progress Notes (Signed)
Subjective:    Patient ID: Hannah [name], female    DOB: 1950/11/22, 65 y.o.   MRN: [id number]  HPI   The patient is a 65 year old female who returns to pain management for further evaluation and treatment of pain involving the lower back and lower extremity region. The patient was quite please with the amount of relief of pain in paresthesias obtained following previous lumbosacral selective nerve root block. The patient continues hydrocodone acetaminophen at this time. We also discussed patient undergoing further evaluation including neurosurgical evaluation and will proceed with neurosurgical evaluation as planned. We will proceed with lumbosacral selective nerve root block to be performed at time return appointment in attempt to decrease severity of patient's symptoms , decrease progression of patient's symptoms, and avoid the need for more involved treatment. The patient was with understanding and agreed with suggested treatment plan we will proceed with lumbosacral selective nerve root block to be performed at time return appointment. All agreed to suggested treatment plan   Review of Systems     Objective:   Physical Exam  There was tenderness to palpation of the splenius capitis and occipitalis musculature region a mild degree. There was mild tenderness of the cervical facet cervical paraspinal musculature region. There was minimal tenderness of the acromioclavicular and glenohumeral joint region and patient appeared to be with slightly decreased grip strength. Tinel and Phalen's maneuver without increased pain of significant degree. Thoracic region thoracic facet region was with mild just palpation of the upper and mid thoracic region and moderate tends to palpation of the lower thoracic paraspinal musculature region. No crepitus of the thoracic region was noted. Palpation over the lumbar paraspinal musculatures and lumbar facet region was attends to palpation of moderate degree there  was moderate tenderness of the PSIS and PII S region as well as the gluteal and piriformis musculature regions. Straight leg raising was limited to approximately 20 without a definite increased pain with dorsiflexion noted. No definite sensory deficit or dermatomal distribution was detected. EHL strength was decreased. There was negative clonus negative Homans. Abdomen was nontender and no costovertebral tenderness was noted      Assessment & Plan:    Degenerative disc disease lumbar spine Kyphotic curvature at the 1112 disc space, spondylosis and shallow disc bulge at this level resulting in mild canal and moderate bilateral foraminal stenosis and mild anterior and superior T12 upper endplate depression, lack of marrow edema indicative of chronic change with mild osseous and disc changes at the remaining levels with adrenal nodule. Disc desiccation central right with the central disc bulge resulting and right foraminal narrowing L3-4 level with moderate left and right facet arthrosis. L4-L5 disc desiccation. Shallow broad-based disc bulging and narrowing of the central canal and left foramen at L4-L5 level  Lumbar radiculopathy  Lumbar facet syndrome  Greater trochanteric bursitis  Sacroiliac joint dysfunction     PLAN   Continue present medication hydrocodone acetaminophen  Lumbosacral selective nerve root block to be performed at time of return appointment  F/U PCP Dr. Ronette Deter for evaliation of  BP and general medical  condition   F/U surgical evaluation.   Ask secretaries and nurses the date of your neurosurgical evaluation as we previously discussed You need to have neurosurgical evaluation of lower back and lower extremity pain anesthesia's and weakness  F/U neurological evaluation. May consider pending follow-up evaluations  F/U  psych evaluation  May consider radiofrequency rhizolysis or intraspinal procedures pending response to present treatment and F/U  evaluation   Patient to call [facility] should patient have concerns prior to scheduled return appointment

## 2015-06-10 ENCOUNTER — Ambulatory Visit: Payer: BLUE CROSS/BLUE SHIELD | Attending: Pain Medicine | Admitting: Pain Medicine

## 2015-06-10 ENCOUNTER — Encounter: Payer: Self-pay | Admitting: Pain Medicine

## 2015-06-10 VITALS — BP 150/73 | HR 72 | Temp 97.6°F | Resp 14 | Ht 62.0 in | Wt 152.0 lb

## 2015-06-10 DIAGNOSIS — M5136 Other intervertebral disc degeneration, lumbar region: Secondary | ICD-10-CM | POA: Insufficient documentation

## 2015-06-10 DIAGNOSIS — M79604 Pain in right leg: Secondary | ICD-10-CM

## 2015-06-10 DIAGNOSIS — M5416 Radiculopathy, lumbar region: Secondary | ICD-10-CM

## 2015-06-10 DIAGNOSIS — E278 Other specified disorders of adrenal gland: Secondary | ICD-10-CM | POA: Diagnosis not present

## 2015-06-10 DIAGNOSIS — M533 Sacrococcygeal disorders, not elsewhere classified: Secondary | ICD-10-CM

## 2015-06-10 DIAGNOSIS — M48062 Spinal stenosis, lumbar region with neurogenic claudication: Secondary | ICD-10-CM

## 2015-06-10 DIAGNOSIS — M79606 Pain in leg, unspecified: Secondary | ICD-10-CM | POA: Diagnosis present

## 2015-06-10 DIAGNOSIS — M545 Low back pain: Secondary | ICD-10-CM | POA: Diagnosis present

## 2015-06-10 DIAGNOSIS — M5126 Other intervertebral disc displacement, lumbar region: Secondary | ICD-10-CM | POA: Insufficient documentation

## 2015-06-10 DIAGNOSIS — M79659 Pain in unspecified thigh: Secondary | ICD-10-CM

## 2015-06-10 DIAGNOSIS — G5790 Unspecified mononeuropathy of unspecified lower limb: Secondary | ICD-10-CM

## 2015-06-10 DIAGNOSIS — M47816 Spondylosis without myelopathy or radiculopathy, lumbar region: Secondary | ICD-10-CM

## 2015-06-10 DIAGNOSIS — M4806 Spinal stenosis, lumbar region: Secondary | ICD-10-CM | POA: Diagnosis not present

## 2015-06-10 DIAGNOSIS — M7060 Trochanteric bursitis, unspecified hip: Secondary | ICD-10-CM

## 2015-06-10 MED ORDER — BUPIVACAINE HCL (PF) 0.25 % IJ SOLN
INTRAMUSCULAR | Status: AC
  Administered 2015-06-10: 13:00:00
  Filled 2015-06-10: qty 30

## 2015-06-10 MED ORDER — FENTANYL CITRATE (PF) 100 MCG/2ML IJ SOLN: 100.0000 ug | Freq: Once | INTRAMUSCULAR | Status: DC

## 2015-06-10 MED ORDER — CEFUROXIME AXETIL 250 MG PO TABS: 250.0000 mg | ORAL_TABLET | Freq: Two times a day (BID) | ORAL | Status: DC

## 2015-06-10 MED ORDER — TRIAMCINOLONE ACETONIDE 40 MG/ML IJ SUSP: 40.0000 mg | Freq: Once | INTRAMUSCULAR | Status: DC

## 2015-06-10 MED ORDER — FENTANYL CITRATE (PF) 100 MCG/2ML IJ SOLN
INTRAMUSCULAR | Status: AC
  Administered 2015-06-10: 50 ug via INTRAVENOUS
  Filled 2015-06-10: qty 2

## 2015-06-10 MED ORDER — LACTATED RINGERS IV SOLN: 1000.0000 mL | INTRAVENOUS | Status: DC

## 2015-06-10 MED ORDER — SODIUM CHLORIDE 0.9% FLUSH: 20.0000 mL | Freq: Once | INTRAVENOUS | Status: DC

## 2015-06-10 MED ORDER — SODIUM CHLORIDE 0.9 % IJ SOLN
INTRAMUSCULAR | Status: AC
  Administered 2015-06-10: 13:00:00
  Filled 2015-06-10: qty 10

## 2015-06-10 MED ORDER — ORPHENADRINE CITRATE 30 MG/ML IJ SOLN
INTRAMUSCULAR | Status: AC
  Administered 2015-06-10: 13:00:00
  Filled 2015-06-10: qty 2

## 2015-06-10 MED ORDER — CEFAZOLIN SODIUM 1-5 GM-% IV SOLN: 1.0000 g | Freq: Once | INTRAVENOUS | Status: DC

## 2015-06-10 MED ORDER — ORPHENADRINE CITRATE 30 MG/ML IJ SOLN: 60.0000 mg | Freq: Once | INTRAMUSCULAR | Status: DC

## 2015-06-10 MED ORDER — MIDAZOLAM HCL 5 MG/5ML IJ SOLN: 5.0000 mg | Freq: Once | INTRAMUSCULAR | Status: DC

## 2015-06-10 MED ORDER — LIDOCAINE HCL (PF) 1 % IJ SOLN: 10.0000 mL | Freq: Once | INTRAMUSCULAR | Status: DC

## 2015-06-10 MED ORDER — CEFAZOLIN SODIUM 1 G IJ SOLR
INTRAMUSCULAR | Status: AC
  Administered 2015-06-10: 13:00:00
  Filled 2015-06-10: qty 10

## 2015-06-10 MED ORDER — TRIAMCINOLONE ACETONIDE 40 MG/ML IJ SUSP
INTRAMUSCULAR | Status: AC
  Administered 2015-06-10: 13:00:00
  Filled 2015-06-10: qty 1

## 2015-06-10 MED ORDER — MIDAZOLAM HCL 5 MG/5ML IJ SOLN
INTRAMUSCULAR | Status: AC
  Administered 2015-06-10: 4 mg via INTRAVENOUS
  Filled 2015-06-10: qty 5

## 2015-06-10 NOTE — Patient Instructions (Addendum)
PLAN   Continue present medication hydrocodone acetaminophen . Please get Ceftin antibiotic today and begin taking Ceftin antibiotic as prescribed  F/U PCP Dr. Ronette Deter for evaliation of  BP and general medical  condition   F/U surgical evaluation. May consider pending follow-up evaluations  F/U neurological evaluation. May consider pending follow-up evaluations  F/U  psych evaluation  May consider radiofrequency rhizolysis or intraspinal procedures pending response to present treatment and F/U evaluation   Patient to call [facility] should patient have concerns prior to scheduled return appointmentGENERAL RISKS AND COMPLICATIONS  What are the risk, side effects and possible complications? Generally speaking, most procedures are safe.  However, with any procedure there are risks, side effects, and the possibility of complications.  The risks and complications are dependent upon the sites that are lesioned, or the type of nerve block to be performed.  The closer the procedure is to the spine, the more serious the risks are.  Great care is taken when placing the radio frequency needles, block needles or lesioning probes, but sometimes complications can occur. 1. Infection: Any time there is an injection through the skin, there is a risk of infection.  This is why sterile conditions are used for these blocks.  There are four possible types of infection. 1. Localized skin infection. 2. Central Nervous System Infection-This can be in the form of Meningitis, which can be deadly. 3. Epidural Infections-This can be in the form of an epidural abscess, which can cause pressure inside of the spine, causing compression of the spinal cord with subsequent paralysis. This would require an emergency surgery to decompress, and there are no guarantees that the patient would recover from the paralysis. 4. Discitis-This is an infection of the intervertebral discs.  It occurs in about 1% of  discography procedures.  It is difficult to treat and it may lead to surgery.        2. Pain: the needles have to go through skin and soft tissues, will cause soreness.       3. Damage to internal structures:  The nerves to be lesioned may be near blood vessels or    other nerves which can be potentially damaged.       4. Bleeding: Bleeding is more common if the patient is taking blood thinners such as  aspirin, Coumadin, Ticiid, Plavix, etc., or if he/she have some genetic predisposition  such as hemophilia. Bleeding into the spinal canal can cause compression of the spinal  cord with subsequent paralysis.  This would require an emergency surgery to  decompress and there are no guarantees that the patient would recover from the  paralysis.       5. Pneumothorax:  Puncturing of a lung is a possibility, every time a needle is introduced in  the area of the chest or upper back.  Pneumothorax refers to free air around the  collapsed lung(s), inside of the thoracic cavity (chest cavity).  Another two possible  complications related to a similar event would include: Hemothorax and Chylothorax.   These are variations of the Pneumothorax, where instead of air around the collapsed  lung(s), you may have blood or chyle, respectively.       6. Spinal headaches: They may occur with any procedures in the area of the spine.       7. Persistent CSF (Cerebro-Spinal Fluid) leakage: This is a rare problem, but may occur  with prolonged intrathecal or epidural catheters either due to the formation of  a fistulous  track or a dural tear.       8. Nerve damage: By working so close to the spinal cord, there is always a possibility of  nerve damage, which could be as serious as a permanent spinal cord injury with  paralysis.       9. Death:  Although rare, severe deadly allergic reactions known as "Anaphylactic  reaction" can occur to any of the medications used.      10. Worsening of the symptoms:  We can always make thing  worse.  What are the chances of something like this happening? Chances of any of this occuring are extremely low.  By statistics, you have more of a chance of getting killed in a motor vehicle accident: while driving to the hospital than any of the above occurring .  Nevertheless, you should be aware that they are possibilities.  In general, it is similar to taking a shower.  Everybody knows that you can slip, hit your head and get killed.  Does that mean that you should not shower again?  Nevertheless always keep in mind that statistics do not mean anything if you happen to be on the wrong side of them.  Even if a procedure has a 1 (one) in a 1,000,000 (million) chance of going wrong, it you happen to be that one..Also, keep in mind that by statistics, you have more of a chance of having something go wrong when taking medications.  Who should not have this procedure? If you are on a blood thinning medication (e.g. Coumadin, Plavix, see list of "Blood Thinners"), or if you have an active infection going on, you should not have the procedure.  If you are taking any blood thinners, please inform your physician.  How should I prepare for this procedure?  Do not eat or drink anything at least six hours prior to the procedure.  Bring a driver with you .  It cannot be a taxi.  Come accompanied by an adult that can drive you back, and that is strong enough to help you if your legs get weak or numb from the local anesthetic.  Take all of your medicines the morning of the procedure with just enough water to swallow them.  If you have diabetes, make sure that you are scheduled to have your procedure done first thing in the morning, whenever possible.  If you have diabetes, take only half of your insulin dose and notify our nurse that you have done so as soon as you arrive at the clinic.  If you are diabetic, but only take blood sugar pills (oral hypoglycemic), then do not take them on the morning of your  procedure.  You may take them after you have had the procedure.  Do not take aspirin or any aspirin-containing medications, at least eleven (11) days prior to the procedure.  They may prolong bleeding.  Wear loose fitting clothing that may be easy to take off and that you would not mind if it got stained with Betadine or blood.  Do not wear any jewelry or perfume  Remove any nail coloring.  It will interfere with some of our monitoring equipment.  NOTE: Remember that this is not meant to be interpreted as a complete list of all possible complications.  Unforeseen problems may occur.  BLOOD THINNERS The following drugs contain aspirin or other products, which can cause increased bleeding during surgery and should not be taken for 2 weeks prior to and 1  week after surgery.  If you should need take something for relief of minor pain, you may take acetaminophen which is found in Tylenol,m Datril, Anacin-3 and Panadol. It is not blood thinner. The products listed below are.  Do not take any of the products listed below in addition to any listed on your instruction sheet.  A.P.C or A.P.C with Codeine Codeine Phosphate Capsules #3 Ibuprofen Ridaura  ABC compound Congesprin Imuran rimadil  Advil Cope Indocin Robaxisal  Alka-Seltzer Effervescent Pain Reliever and Antacid Coricidin or Coricidin-D  Indomethacin Rufen  Alka-Seltzer plus Cold Medicine Cosprin Ketoprofen S-A-C Tablets  Anacin Analgesic Tablets or Capsules Coumadin Korlgesic Salflex  Anacin Extra Strength Analgesic tablets or capsules CP-2 Tablets Lanoril Salicylate  Anaprox Cuprimine Capsules Levenox Salocol  Anexsia-D Dalteparin Magan Salsalate  Anodynos Darvon compound Magnesium Salicylate Sine-off  Ansaid Dasin Capsules Magsal Sodium Salicylate  Anturane Depen Capsules Marnal Soma  APF Arthritis pain formula Dewitt's Pills Measurin Stanback  Argesic Dia-Gesic Meclofenamic Sulfinpyrazone  Arthritis Bayer Timed Release Aspirin  Diclofenac Meclomen Sulindac  Arthritis pain formula Anacin Dicumarol Medipren Supac  Analgesic (Safety coated) Arthralgen Diffunasal Mefanamic Suprofen  Arthritis Strength Bufferin Dihydrocodeine Mepro Compound Suprol  Arthropan liquid Dopirydamole Methcarbomol with Aspirin Synalgos  ASA tablets/Enseals Disalcid Micrainin Tagament  Ascriptin Doan's Midol Talwin  Ascriptin A/D Dolene Mobidin Tanderil  Ascriptin Extra Strength Dolobid Moblgesic Ticlid  Ascriptin with Codeine Doloprin or Doloprin with Codeine Momentum Tolectin  Asperbuf Duoprin Mono-gesic Trendar  Aspergum Duradyne Motrin or Motrin IB Triminicin  Aspirin plain, buffered or enteric coated Durasal Myochrisine Trigesic  Aspirin Suppositories Easprin Nalfon Trillsate  Aspirin with Codeine Ecotrin Regular or Extra Strength Naprosyn Uracel  Atromid-S Efficin Naproxen Ursinus  Auranofin Capsules Elmiron Neocylate Vanquish  Axotal Emagrin Norgesic Verin  Azathioprine Empirin or Empirin with Codeine Normiflo Vitamin E  Azolid Emprazil Nuprin Voltaren  Bayer Aspirin plain, buffered or children's or timed BC Tablets or powders Encaprin Orgaran Warfarin Sodium  Buff-a-Comp Enoxaparin Orudis Zorpin  Buff-a-Comp with Codeine Equegesic Os-Cal-Gesic   Buffaprin Excedrin plain, buffered or Extra Strength Oxalid   Bufferin Arthritis Strength Feldene Oxphenbutazone   Bufferin plain or Extra Strength Feldene Capsules Oxycodone with Aspirin   Bufferin with Codeine Fenoprofen Fenoprofen Pabalate or Pabalate-SF   Buffets II Flogesic Panagesic   Buffinol plain or Extra Strength Florinal or Florinal with Codeine Panwarfarin   Buf-Tabs Flurbiprofen Penicillamine   Butalbital Compound Four-way cold tablets Penicillin   Butazolidin Fragmin Pepto-Bismol   Carbenicillin Geminisyn Percodan   Carna Arthritis Reliever Geopen Persantine   Carprofen Gold's salt Persistin   Chloramphenicol Goody's Phenylbutazone   Chloromycetin Haltrain Piroxlcam    Clmetidine heparin Plaquenil   Cllnoril Hyco-pap Ponstel   Clofibrate Hydroxy chloroquine Propoxyphen         Before stopping any of these medications, be sure to consult the physician who ordered them.  Some, such as Coumadin (Warfarin) are ordered to prevent or treat serious conditions such as "deep thrombosis", "pumonary embolisms", and other heart problems.  The amount of time that you may need off of the medication may also vary with the medication and the reason for which you were taking it.  If you are taking any of these medications, please make sure you notify your pain physician before you undergo any procedures.         GENERAL RISKS AND COMPLICATIONS  What are the risk, side effects and possible complications? Generally speaking, most procedures are safe.  However, with any procedure there are risks, side effects,  and the possibility of complications.  The risks and complications are dependent upon the sites that are lesioned, or the type of nerve block to be performed.  The closer the procedure is to the spine, the more serious the risks are.  Great care is taken when placing the radio frequency needles, block needles or lesioning probes, but sometimes complications can occur. 2. Infection: Any time there is an injection through the skin, there is a risk of infection.  This is why sterile conditions are used for these blocks.  There are four possible types of infection. 1. Localized skin infection. 2. Central Nervous System Infection-This can be in the form of Meningitis, which can be deadly. 3. Epidural Infections-This can be in the form of an epidural abscess, which can cause pressure inside of the spine, causing compression of the spinal cord with subsequent paralysis. This would require an emergency surgery to decompress, and there are no guarantees that the patient would recover from the paralysis. 4. Discitis-This is an infection of the intervertebral discs.  It occurs in  about 1% of discography procedures.  It is difficult to treat and it may lead to surgery.        2. Pain: the needles have to go through skin and soft tissues, will cause soreness.       3. Damage to internal structures:  The nerves to be lesioned may be near blood vessels or    other nerves which can be potentially damaged.       4. Bleeding: Bleeding is more common if the patient is taking blood thinners such as  aspirin, Coumadin, Ticiid, Plavix, etc., or if he/she have some genetic predisposition  such as hemophilia. Bleeding into the spinal canal can cause compression of the spinal  cord with subsequent paralysis.  This would require an emergency surgery to  decompress and there are no guarantees that the patient would recover from the  paralysis.       5. Pneumothorax:  Puncturing of a lung is a possibility, every time a needle is introduced in  the area of the chest or upper back.  Pneumothorax refers to free air around the  collapsed lung(s), inside of the thoracic cavity (chest cavity).  Another two possible  complications related to a similar event would include: Hemothorax and Chylothorax.   These are variations of the Pneumothorax, where instead of air around the collapsed  lung(s), you may have blood or chyle, respectively.       6. Spinal headaches: They may occur with any procedures in the area of the spine.       7. Persistent CSF (Cerebro-Spinal Fluid) leakage: This is a rare problem, but may occur  with prolonged intrathecal or epidural catheters either due to the formation of a fistulous  track or a dural tear.       8. Nerve damage: By working so close to the spinal cord, there is always a possibility of  nerve damage, which could be as serious as a permanent spinal cord injury with  paralysis.       9. Death:  Although rare, severe deadly allergic reactions known as "Anaphylactic  reaction" can occur to any of the medications used.      10. Worsening of the symptoms:  We can always  make thing worse.  What are the chances of something like this happening? Chances of any of this occuring are extremely low.  By statistics, you have more of a chance of  getting killed in a motor vehicle accident: while driving to the hospital than any of the above occurring .  Nevertheless, you should be aware that they are possibilities.  In general, it is similar to taking a shower.  Everybody knows that you can slip, hit your head and get killed.  Does that mean that you should not shower again?  Nevertheless always keep in mind that statistics do not mean anything if you happen to be on the wrong side of them.  Even if a procedure has a 1 (one) in a 1,000,000 (million) chance of going wrong, it you happen to be that one..Also, keep in mind that by statistics, you have more of a chance of having something go wrong when taking medications.  Who should not have this procedure? If you are on a blood thinning medication (e.g. Coumadin, Plavix, see list of "Blood Thinners"), or if you have an active infection going on, you should not have the procedure.  If you are taking any blood thinners, please inform your physician.  How should I prepare for this procedure?  Do not eat or drink anything at least six hours prior to the procedure.  Bring a driver with you .  It cannot be a taxi.  Come accompanied by an adult that can drive you back, and that is strong enough to help you if your legs get weak or numb from the local anesthetic.  Take all of your medicines the morning of the procedure with just enough water to swallow them.  If you have diabetes, make sure that you are scheduled to have your procedure done first thing in the morning, whenever possible.  If you have diabetes, take only half of your insulin dose and notify our nurse that you have done so as soon as you arrive at the clinic.  If you are diabetic, but only take blood sugar pills (oral hypoglycemic), then do not take them on the  morning of your procedure.  You may take them after you have had the procedure.  Do not take aspirin or any aspirin-containing medications, at least eleven (11) days prior to the procedure.  They may prolong bleeding.  Wear loose fitting clothing that may be easy to take off and that you would not mind if it got stained with Betadine or blood.  Do not wear any jewelry or perfume  Remove any nail coloring.  It will interfere with some of our monitoring equipment.  NOTE: Remember that this is not meant to be interpreted as a complete list of all possible complications.  Unforeseen problems may occur.  BLOOD THINNERS The following drugs contain aspirin or other products, which can cause increased bleeding during surgery and should not be taken for 2 weeks prior to and 1 week after surgery.  If you should need take something for relief of minor pain, you may take acetaminophen which is found in Tylenol,m Datril, Anacin-3 and Panadol. It is not blood thinner. The products listed below are.  Do not take any of the products listed below in addition to any listed on your instruction sheet.  A.P.C or A.P.C with Codeine Codeine Phosphate Capsules #3 Ibuprofen Ridaura  ABC compound Congesprin Imuran rimadil  Advil Cope Indocin Robaxisal  Alka-Seltzer Effervescent Pain Reliever and Antacid Coricidin or Coricidin-D  Indomethacin Rufen  Alka-Seltzer plus Cold Medicine Cosprin Ketoprofen S-A-C Tablets  Anacin Analgesic Tablets or Capsules Coumadin Korlgesic Salflex  Anacin Extra Strength Analgesic tablets or capsules CP-2 Tablets Lanoril Salicylate  Anaprox Cuprimine Capsules Levenox Salocol  Anexsia-D Dalteparin Magan Salsalate  Anodynos Darvon compound Magnesium Salicylate Sine-off  Ansaid Dasin Capsules Magsal Sodium Salicylate  Anturane Depen Capsules Marnal Soma  APF Arthritis pain formula Dewitt's Pills Measurin Stanback  Argesic Dia-Gesic Meclofenamic Sulfinpyrazone  Arthritis Bayer Timed  Release Aspirin Diclofenac Meclomen Sulindac  Arthritis pain formula Anacin Dicumarol Medipren Supac  Analgesic (Safety coated) Arthralgen Diffunasal Mefanamic Suprofen  Arthritis Strength Bufferin Dihydrocodeine Mepro Compound Suprol  Arthropan liquid Dopirydamole Methcarbomol with Aspirin Synalgos  ASA tablets/Enseals Disalcid Micrainin Tagament  Ascriptin Doan's Midol Talwin  Ascriptin A/D Dolene Mobidin Tanderil  Ascriptin Extra Strength Dolobid Moblgesic Ticlid  Ascriptin with Codeine Doloprin or Doloprin with Codeine Momentum Tolectin  Asperbuf Duoprin Mono-gesic Trendar  Aspergum Duradyne Motrin or Motrin IB Triminicin  Aspirin plain, buffered or enteric coated Durasal Myochrisine Trigesic  Aspirin Suppositories Easprin Nalfon Trillsate  Aspirin with Codeine Ecotrin Regular or Extra Strength Naprosyn Uracel  Atromid-S Efficin Naproxen Ursinus  Auranofin Capsules Elmiron Neocylate Vanquish  Axotal Emagrin Norgesic Verin  Azathioprine Empirin or Empirin with Codeine Normiflo Vitamin E  Azolid Emprazil Nuprin Voltaren  Bayer Aspirin plain, buffered or children's or timed BC Tablets or powders Encaprin Orgaran Warfarin Sodium  Buff-a-Comp Enoxaparin Orudis Zorpin  Buff-a-Comp with Codeine Equegesic Os-Cal-Gesic   Buffaprin Excedrin plain, buffered or Extra Strength Oxalid   Bufferin Arthritis Strength Feldene Oxphenbutazone   Bufferin plain or Extra Strength Feldene Capsules Oxycodone with Aspirin   Bufferin with Codeine Fenoprofen Fenoprofen Pabalate or Pabalate-SF   Buffets II Flogesic Panagesic   Buffinol plain or Extra Strength Florinal or Florinal with Codeine Panwarfarin   Buf-Tabs Flurbiprofen Penicillamine   Butalbital Compound Four-way cold tablets Penicillin   Butazolidin Fragmin Pepto-Bismol   Carbenicillin Geminisyn Percodan   Carna Arthritis Reliever Geopen Persantine   Carprofen Gold's salt Persistin   Chloramphenicol Goody's Phenylbutazone   Chloromycetin  Haltrain Piroxlcam   Clmetidine heparin Plaquenil   Cllnoril Hyco-pap Ponstel   Clofibrate Hydroxy chloroquine Propoxyphen         Before stopping any of these medications, be sure to consult the physician who ordered them.  Some, such as Coumadin (Warfarin) are ordered to prevent or treat serious conditions such as "deep thrombosis", "pumonary embolisms", and other heart problems.  The amount of time that you may need off of the medication may also vary with the medication and the reason for which you were taking it.  If you are taking any of these medications, please make sure you notify your pain physician before you undergo any procedures.

## 2015-06-10 NOTE — Progress Notes (Signed)
Subjective:    Patient ID: Hannah [name], female    DOB: October 27, 1950, 65 y.o.   MRN: [id number]  HPI PROCEDURE PERFORMED: Lumbosacral selective nerve root block   NOTE: The patient is a 65 y.o. female who returns to [place] for further evaluation and treatment of pain involving the lumbar and lower extremity region. Studies consisting of MRI has revealed the patient to be with evidence of degenerative disc disease lumbar spine Kyphotic curvature at the 1112 disc space, spondylosis and shallow disc bulge at this level resulting in mild canal and moderate bilateral foraminal stenosis and mild anterior and superior T12 upper endplate depression, lack of marrow edema indicative of chronic change with mild osseous and disc changes at the remaining levels with adrenal nodule. Disc desiccation central right with the central disc bulge resulting and right foraminal narrowing L3-4 level with moderate left and right facet arthrosis. L4-L5 disc desiccation. Shallow broad-based disc bulging and narrowing of the central canal and left foramen at L4-L5 level. There is concern regarding intraspinal abnormalities contributing to the patient's symptomatology with concern regarding patient being with component of lumbar radiculopathy The risks, benefits, and expectations of the procedure have been explained to the patient who was understanding and in agreement with suggested treatment plan. We will proceed with interventional treatment as discussed and as explained to the patient. The patient is understanding and in agreement with suggested treatment plan.   DESCRIPTION OF PROCEDURE: Lumbosacral selective nerve root block with IV Versed, IV fentanyl conscious sedation, EKG, blood pressure, pulse, and pulse oximetry monitoring. The procedure was performed with the patient in the prone position under fluoroscopic guidance. With the patient in the prone position, Betadine prep of proposed entry site was  performed. Local anesthetic skin wheal of proposed needle entry site was prepared with 1.5% plain lidocaine with AP view of the lumbosacral spine.   PROCEDURE #1: Needle placement at the right L 2 vertebral body: A 22 -gauge needle was inserted at the inferior border of the transverse process of the vertebral body with needle placed medial to the midline of the transverse process on AP view of the lumbosacral spine.   NEEDLE PLACEMENT AT  L3, L4, and L5  VERTEBRAL BODY LEVELS  Needle  placement was accomplished at L3, L4, and L5  vertebral body levels on the right side exactly as was accomplished at the L2  vertebral body level  and utilizing the same technique and under fluoroscopic guidance.    Needle placement was then verified on lateral view at all levels with needle tip documented to be in the posterior superior quadrant of the intervertebral foramen of  L 2 L3, L4, and L5. Following negative aspiration for heme and CSF at each level, each level was injected with 3 mL of preservative-free normal saline with Kenalog.    The patient tolerated the procedure well.    A total of 10 mg of Kenalog was utilized for the procedure.   PLAN:  1. Medications: Will continue presently prescribed medication hydrocodone acetaminophen. 2. The patient is to undergo follow-up evaluation with PCP Dr. Ronette Deter  for evaluation of blood pressure and general medical condition status post procedure performed on today's visit. 3. Surgical follow-up evaluation. Has been addressed 4. Neurological evaluation. May consider PNCV EMG studies and other studies May consider radiofrequency procedures, implantation type procedures and other treatment pending response to treatment and follow-up evaluation. 5. The patient has been advise do adhere to proper body mechanics and  avoid activities which may aggravate condition. 6. The patient has been advised to call the Pain Management Center prior to scheduled return  appointment should there be significant change in the patient's condition or should the patient have other concerns regarding condition prior to scheduled return appointment.    Review of Systems     Objective:   Physical Exam        Assessment & Plan:

## 2015-06-10 NOTE — Progress Notes (Signed)
Patient here for procedure d/t back pain Safety precautions to be maintained throughout the outpatient stay will include: orient to surroundings, keep bed in low position, maintain call bell within reach at all times, provide assistance with transfer out of bed and ambulation.

## 2015-06-11 ENCOUNTER — Telehealth: Payer: Self-pay | Admitting: *Deleted

## 2015-06-11 NOTE — Telephone Encounter (Signed)
Voicemail left for patient to call our office if there are any questions or concerns re; procedure on yesterday. 

## 2015-06-19 ENCOUNTER — Other Ambulatory Visit: Payer: Self-pay | Admitting: Internal Medicine

## 2015-07-01 ENCOUNTER — Encounter: Payer: Self-pay | Admitting: Psychiatry

## 2015-07-01 ENCOUNTER — Ambulatory Visit (INDEPENDENT_AMBULATORY_CARE_PROVIDER_SITE_OTHER): Payer: BLUE CROSS/BLUE SHIELD | Admitting: Psychiatry

## 2015-07-01 VITALS — BP 140/100 | HR 109 | Temp 99.3°F | Ht 62.0 in | Wt 153.8 lb

## 2015-07-01 DIAGNOSIS — F411 Generalized anxiety disorder: Secondary | ICD-10-CM

## 2015-07-01 DIAGNOSIS — F32A Depression, unspecified: Secondary | ICD-10-CM

## 2015-07-01 DIAGNOSIS — F329 Major depressive disorder, single episode, unspecified: Secondary | ICD-10-CM

## 2015-07-01 MED ORDER — CLONAZEPAM 0.5 MG PO TABS: ORAL_TABLET | ORAL | Status: DC

## 2015-07-01 MED ORDER — TRAZODONE HCL 50 MG PO TABS: 50.0000 mg | ORAL_TABLET | Freq: Every day | ORAL | Status: DC

## 2015-07-01 MED ORDER — PAROXETINE HCL 20 MG PO TABS: 20.0000 mg | ORAL_TABLET | Freq: Every day | ORAL | Status: DC

## 2015-07-01 NOTE — Progress Notes (Signed)
Patient ID: Hannah [name], female   DOB: 1951-02-07, 65 y.o.   MRN: [id number] [facility] MD/PA/NP OP Progress Note  07/01/2015 9:23 AM Hannah [name]  MRN:  [id number]  Subjective:  Patient returns for follow-up of her panic disorder with agoraphobia and major depressive disorder recurrent moderate. Patient was previously seen by Dr. Jimmye Norman and this is the first visit for this patient with this clinician. She reports that she has been doing okay in terms of her mood. She lives with her nephew and the he has 3 boys who are in and out of the house and not living with her mother. She states that she loves the sports and enjoys spending time with them. She has been compliant on with the Paxil at 20 mg and states it does help her. She takes the Klonopin 0.5 mg about 3-4 times a week and states that she does not take it twice a day. She does report some trouble with sleeping and says that she has periods where she sleeps well and then most recently she has not been sleeping that good. Overall she reports that her mood is okay. She denies any suicidal thoughts.   Chief Complaint: Sleep Chief Complaint    Follow-up; Medication Refill     Visit Diagnosis:   No diagnosis found.  Past Medical History:  Past Medical History  Diagnosis Date  . GERD (gastroesophageal reflux disease)   . Degenerative joint disease of spine   . Hypertension   . Hyperlipidemia   . Depression     Followed in past by Dr. Randel Books  . Hypercholesteremia   . Heart murmur   . Carotid artery occlusion   . Thyroid disease     Past Surgical History  Procedure Laterality Date  . Abdominal hysterectomy    . Cholecystectomy    . Appendectomy    . Vaginal delivery    . Tonsillectomy    . Adenoidectomy    . Broken wrist  Left   . Fracture surgery Left   . Thyroidectomy Right 02/28/2015    Procedure: THYROIDECTOMY;  Surgeon: Carloyn Manner, MD;  Location: [facility] ORS;  Service: ENT;  Laterality: Right;   Family History:  Family  History  Problem Relation Age of Onset  . Hypertension Mother   . Hyperlipidemia Mother   . Heart disease Mother   . Anxiety disorder Mother   . Drug abuse Mother   . Cancer Sister     breast  . Social phobia Sister   . Alcohol abuse Father   . Anxiety disorder Father   . Drug abuse Father   . Drug abuse Sister   . Anxiety disorder Sister   . Alcohol abuse Sister   . Breast cancer Sister   . Bipolar disorder Sister    Social History:  Social History   Social History  . Marital Status: Widowed    Spouse Name: N/A  . Number of Children: N/A  . Years of Education: N/A   Social History Main Topics  . Smoking status: Never Smoker   . Smokeless tobacco: Never Used  . Alcohol Use: No  . Drug Use: No  . Sexual Activity: Not Currently   Other Topics Concern  . None   Social History Narrative   Lives with nephew in [place]. Helps with care of 2 grand-nephews.   Husband deceased.      Work - previously as Theatre manager History:   Assessment:   Musculoskeletal: Strength &  Muscle Tone: within normal limits Gait & Station: normal Patient leans: N/A  Psychiatric Specialty Exam: HPI  Review of Systems  Psychiatric/Behavioral: Negative for depression, suicidal ideas, hallucinations, memory loss and substance abuse. The patient is nervous/anxious (states it's responding well to the current medication regimen but she does occasionally have panic attacks) and has insomnia.     Blood pressure 140/100, pulse 109, temperature 99.3 F (37.4 C), temperature source Axillary, height 5\' 2"  (1.575 m), weight 153 lb 12.8 oz (69.763 kg), SpO2 96 %.Body mass index is 28.12 kg/(m^2).  General Appearance: Well Groomed  Eye Contact:  Good  Speech:  Normal Rate  Volume:  Normal  Mood:  All right  Affect:  Smiling   Thought Process:  Linear and Logical  Orientation:  Full (Time, Place, and Person)  Thought Content:  Negative  Suicidal Thoughts:  No   Homicidal Thoughts:  No  Memory:  Immediate;   Good Recent;   Good Remote;   Good  Judgement:  Good  Insight:  NA  Psychomotor Activity:  Negative  Concentration:  Good  Recall:  Good  Fund of Knowledge: Good  Language: Good  Akathisia:  Negative  Handed:   AIMS (if indicated):    Assets:  Communication Skills Desire for Improvement Social Support  ADL's:  Intact  Cognition: WNL  Sleep:  Or 3-4 hours a night, frequent wakening    Is the patient at risk to self?  No. Has the patient been a risk to self in the past 6 months?  No. Has the patient been a risk to self within the distant past?  No. Is the patient a risk to others?  No. Has the patient been a risk to others in the past 6 months?  No. Has the patient been a risk to others within the distant past?  No.  Current Medications: Current Outpatient Prescriptions  Medication Sig Dispense Refill  . amLODipine (NORVASC) 5 MG tablet Take 1 tablet (5 mg total) by mouth daily. 90 tablet 3  . clonazePAM (KLONOPIN) 0.5 MG tablet Take 1 tablet (0.5 mg total) by mouth 2 (two) times daily as needed for anxiety. 60 tablet 4  . gabapentin (NEURONTIN) 300 MG capsule TAKE 2 CAPSULES BY MOUTH TWICE DAILY 120 capsule 3  . HYDROcodone-acetaminophen (NORCO/VICODIN) 5-325 MG tablet Limit 1 tab by mouth per day or twice per day if tolerated 50 tablet 0  . hyoscyamine (LEVSIN SL) 0.125 MG SL tablet DISSOLVE ONE TABLET IN MOUTH EVERY 4 HOURS AS NEEDED 30 tablet 0  . naproxen sodium (ANAPROX) 220 MG tablet Take 220 mg by mouth as needed.    Marland Kitchen omeprazole (PRILOSEC) 40 MG capsule TAKE 1 CAPSULE BY MOUTH EVERY DAY 30 capsule 11  . ondansetron (ZOFRAN) 4 MG tablet Take 1 tablet (4 mg total) by mouth every 4 (four) hours as needed for nausea. 20 tablet 0  . oxyCODONE-acetaminophen (ROXICET) 5-325 MG tablet Take 1 tablet by mouth every 6 (six) hours as needed. 20 tablet 0  . PARoxetine (PAXIL) 20 MG tablet Take 1 tablet (20 mg total) by mouth daily. 30  tablet 4  . senna-docusate (SENOKOT-S) 8.6-50 MG tablet Take 1 tablet by mouth at bedtime as needed for mild constipation. 45 tablet 0   Current Facility-Administered Medications  Medication Dose Route Frequency Provider Last Rate Last Dose  . bupivacaine (PF) (MARCAINE) 0.25 % injection 30 mL  30 mL Other Once Mohammed Kindle, MD      . bupivacaine (PF) (MARCAINE)  0.25 % injection 30 mL  30 mL Other Once Mohammed Kindle, MD      . ceFAZolin (ANCEF) IVPB 1 g/50 mL premix  1 g Intravenous Once Mohammed Kindle, MD      . ceFAZolin (ANCEF) IVPB 1 g/50 mL premix  1 g Intravenous Once Mohammed Kindle, MD      . ceFAZolin (ANCEF) IVPB 1 g/50 mL premix  1 g Intravenous Once Mohammed Kindle, MD      . fentaNYL (SUBLIMAZE) injection 100 mcg  100 mcg Intravenous Once Mohammed Kindle, MD      . fentaNYL (SUBLIMAZE) injection 100 mcg  100 mcg Intravenous Once Mohammed Kindle, MD      . fentaNYL (SUBLIMAZE) injection 100 mcg  100 mcg Intravenous Once Mohammed Kindle, MD      . fentaNYL (SUBLIMAZE) injection 100 mcg  100 mcg Intravenous Once Mohammed Kindle, MD      . lactated ringers infusion 1,000 mL  1,000 mL Intravenous Continuous Mohammed Kindle, MD      . lactated ringers infusion 1,000 mL  1,000 mL Intravenous Continuous Mohammed Kindle, MD      . lactated ringers infusion 1,000 mL  1,000 mL Intravenous Continuous Mohammed Kindle, MD      . lactated ringers infusion 1,000 mL  1,000 mL Intravenous Continuous Mohammed Kindle, MD      . lidocaine (PF) (XYLOCAINE) 1 % injection 10 mL  10 mL Subcutaneous Once Mohammed Kindle, MD      . lidocaine (PF) (XYLOCAINE) 1 % injection 10 mL  10 mL Subcutaneous Once Mohammed Kindle, MD      . midazolam (VERSED) 5 MG/5ML injection 5 mg  5 mg Intravenous Once Mohammed Kindle, MD      . midazolam (VERSED) 5 MG/5ML injection 5 mg  5 mg Intravenous Once Mohammed Kindle, MD      . midazolam (VERSED) 5 MG/5ML injection 5 mg  5 mg Intravenous Once Mohammed Kindle, MD      . midazolam (VERSED) 5 MG/5ML  injection 5 mg  5 mg Intravenous Once Mohammed Kindle, MD      . orphenadrine (NORFLEX) injection 60 mg  60 mg Intramuscular Once Mohammed Kindle, MD      . orphenadrine (NORFLEX) injection 60 mg  60 mg Intramuscular Once Mohammed Kindle, MD      . orphenadrine (NORFLEX) injection 60 mg  60 mg Intramuscular Once Mohammed Kindle, MD      . orphenadrine (NORFLEX) injection 60 mg  60 mg Intramuscular Once Mohammed Kindle, MD      . sodium chloride flush (NS) 0.9 % injection 20 mL  20 mL Other Once Mohammed Kindle, MD      . triamcinolone acetonide (KENALOG-40) injection 40 mg  40 mg Other Once Mohammed Kindle, MD      . triamcinolone acetonide (KENALOG-40) injection 40 mg  40 mg Other Once Mohammed Kindle, MD      . triamcinolone acetonide (KENALOG-40) injection 40 mg  40 mg Other Once Mohammed Kindle, MD      . triamcinolone acetonide (KENALOG-40) injection 40 mg  40 mg Other Once Mohammed Kindle, MD        Medical Decision Making:  Established Problem, Stable/Improving (1), Review of Medication Regimen & Side Effects (2) and Review of New Medication or Change in Dosage (2)  Treatment Plan Summary:Medication management and Plan Plan   Panic disorder with agoraphobia-continue Paxil 20 mg in the morning.  Continue Klonopin at 0.5 mg once daily as needed for  anxiety about 3-4 times a week.  Major depressive disorder, recurrent, moderate-Paxil as above.   Insomnia- Start trazodone at 50mg  po qhs prn for insomnia  Return to clinic in 2 months time or call before if needed  Aslan Himes 07/01/2015, 9:23 AM

## 2015-07-04 ENCOUNTER — Ambulatory Visit: Payer: BLUE CROSS/BLUE SHIELD | Attending: Pain Medicine | Admitting: Pain Medicine

## 2015-07-04 ENCOUNTER — Encounter: Payer: Self-pay | Admitting: Pain Medicine

## 2015-07-04 VITALS — BP 125/73 | HR 76 | Temp 98.1°F | Resp 18 | Ht 60.0 in | Wt 152.0 lb

## 2015-07-04 DIAGNOSIS — M5116 Intervertebral disc disorders with radiculopathy, lumbar region: Secondary | ICD-10-CM | POA: Insufficient documentation

## 2015-07-04 DIAGNOSIS — M47896 Other spondylosis, lumbar region: Secondary | ICD-10-CM | POA: Insufficient documentation

## 2015-07-04 DIAGNOSIS — M4806 Spinal stenosis, lumbar region: Secondary | ICD-10-CM | POA: Insufficient documentation

## 2015-07-04 DIAGNOSIS — M533 Sacrococcygeal disorders, not elsewhere classified: Secondary | ICD-10-CM | POA: Diagnosis not present

## 2015-07-04 DIAGNOSIS — E278 Other specified disorders of adrenal gland: Secondary | ICD-10-CM | POA: Diagnosis not present

## 2015-07-04 DIAGNOSIS — M545 Low back pain: Secondary | ICD-10-CM | POA: Diagnosis present

## 2015-07-04 DIAGNOSIS — M5126 Other intervertebral disc displacement, lumbar region: Secondary | ICD-10-CM | POA: Diagnosis not present

## 2015-07-04 DIAGNOSIS — M7060 Trochanteric bursitis, unspecified hip: Secondary | ICD-10-CM | POA: Diagnosis not present

## 2015-07-04 DIAGNOSIS — M48062 Spinal stenosis, lumbar region with neurogenic claudication: Secondary | ICD-10-CM

## 2015-07-04 DIAGNOSIS — M79606 Pain in leg, unspecified: Secondary | ICD-10-CM | POA: Diagnosis present

## 2015-07-04 DIAGNOSIS — M47816 Spondylosis without myelopathy or radiculopathy, lumbar region: Secondary | ICD-10-CM

## 2015-07-04 DIAGNOSIS — M5416 Radiculopathy, lumbar region: Secondary | ICD-10-CM

## 2015-07-04 DIAGNOSIS — G5790 Unspecified mononeuropathy of unspecified lower limb: Secondary | ICD-10-CM

## 2015-07-04 DIAGNOSIS — M79604 Pain in right leg: Secondary | ICD-10-CM

## 2015-07-04 DIAGNOSIS — M546 Pain in thoracic spine: Secondary | ICD-10-CM | POA: Diagnosis present

## 2015-07-04 DIAGNOSIS — M79659 Pain in unspecified thigh: Secondary | ICD-10-CM

## 2015-07-04 DIAGNOSIS — M5136 Other intervertebral disc degeneration, lumbar region: Secondary | ICD-10-CM

## 2015-07-04 MED ORDER — HYDROCODONE-ACETAMINOPHEN 5-325 MG PO TABS: ORAL_TABLET | ORAL | Status: DC

## 2015-07-04 NOTE — Progress Notes (Signed)
   Subjective:    Patient ID: Hannah [name], female    DOB: 11-21-1950, 65 y.o.   MRN: [id number]  HPI     The patient is a 65 year old female who returns to pain management for further evaluation and treatment of pain involving the upper mid lower back and lower extremity regions. The patient states that the pain has improved with prior treatment performed in pain management Center. At the present time the patient is scheduled to undergo surgical evaluation. We will await surgical disposition and will consider patient for modification of treatment regimen pending follow-up evaluation. All agreed to suggested treatment plan. T the patient denies any trauma change in events of daily living the call significant change in symptomatology.        Review of Systems     Objective:   Physical Exam  There was tenderness of the splenius capitis and occipitalis muscles region palpation which reproduces mild to moderate discomfort. There was mild to moderate tenderness of the trapezius levator scapula and rhomboid musculature regions. Palpation over the region of the acromioclavicular and glenohumeral joint regions reproduce mild to moderate discomfort with no crepitus of the thoracic region noted. Palpation over the region of the PSIS and PII S region reproduces moderate discomfort with moderate tenderness over the greater trochanteric region iliotibial band region. Straight leg raising limited proximal 20 without a definite increased pain with dorsiflexion noted. There appeared to be negative clonus negative Homans. DTRs were difficult to this patient had difficulty relaxing. EHL strength was questionably decreased. There was tenderness of the greater trochanteric region iliotibial band region a mild degree. There was tenderness to palpation of the gluteal and piriformis musculature region a mild tenderness of the greater trochanteric region iliotibial band region. No sensory deficit dermatomal  dystrophy detected. Knees were attends to palpation with no increased warmth erythema of the knees noted there was crepitus of the knee noted with negative anterior and posterior drawer signs without ballottement of the patella. Negative clonus negative Homans. Abdomen nontender and no costovertebral tenderness     Assessment & Plan:       Degenerative disc disease lumbar spine Kyphotic curvature at the 1112 disc space, spondylosis and shallow disc bulge at this level resulting in mild canal and moderate bilateral foraminal stenosis and mild anterior and superior T12 upper endplate depression, lack of marrow edema indicative of chronic change with mild osseous and disc changes at the remaining levels with adrenal nodule. Disc desiccation central right with the central disc bulge resulting and right foraminal narrowing L3-4 level with moderate left and right facet arthrosis. L4-L5 disc desiccation. Shallow broad-based disc bulging and narrowing of the central canal and left foramen at L4-L5 level  Lumbar radiculopathy  Lumbar facet syndrome  Greater trochanteric bursitis  Sacroiliac joint dysfunction       LAN   Continue present medication hydrocodone acetaminophen  F/U PCP Dr. Ronette Deter for evaliation of  BP and general medical  condition   F/U surgical evaluation.. Patient will proceed with neurosurgical evaluation with Dr. Trenton Gammon  F/U neurological evaluation. May consider PNCV/EMG studies and other studies pending follow-up evaluations  F/U  psych evaluation  May consider radiofrequency rhizolysis or intraspinal procedures pending response to present treatment and F/U evaluation   Patient to call Pain Management Center should patient have concerns prior to scheduled return appointment

## 2015-07-04 NOTE — Progress Notes (Signed)
Safety precautions to be maintained throughout the outpatient stay will include: orient to surroundings, keep bed in low position, maintain call bell within reach at all times, provide assistance with transfer out of bed and ambulation.  

## 2015-07-04 NOTE — Patient Instructions (Addendum)
PLAN   Continue present medication hydrocodone acetaminophen  F/U PCP Dr. Ronette Deter for evaliation of  BP and general medical  condition   F/U surgical evaluation.. Patient will proceed with neurosurgical evaluation with Dr. Trenton Gammon  F/U neurological evaluation. May consider PNCV/EMG studies and other studies pending follow-up evaluations  F/U  psych evaluation  May consider radiofrequency rhizolysis or intraspinal procedures pending response to present treatment and F/U evaluation   Patient to call [facility] should patient have concerns prior to scheduled return appointmentGENERAL RISKS AND COMPLICATIONS  What are the risk, side effects and possible complications? Generally speaking, most procedures are safe.  However, with any procedure there are risks, side effects, and the possibility of complications.  The risks and complications are dependent upon the sites that are lesioned, or the type of nerve block to be performed.  The closer the procedure is to the spine, the more serious the risks are.  Great care is taken when placing the radio frequency needles, block needles or lesioning probes, but sometimes complications can occur. 1. Infection: Any time there is an injection through the skin, there is a risk of infection.  This is why sterile conditions are used for these blocks.  There are four possible types of infection. 1. Localized skin infection. 2. Central Nervous System Infection-This can be in the form of Meningitis, which can be deadly. 3. Epidural Infections-This can be in the form of an epidural abscess, which can cause pressure inside of the spine, causing compression of the spinal cord with subsequent paralysis. This would require an emergency surgery to decompress, and there are no guarantees that the patient would recover from the paralysis. 4. Discitis-This is an infection of the intervertebral discs.  It occurs in about 1% of discography procedures.  It is  difficult to treat and it may lead to surgery.        2. Pain: the needles have to go through skin and soft tissues, will cause soreness.       3. Damage to internal structures:  The nerves to be lesioned may be near blood vessels or    other nerves which can be potentially damaged.       4. Bleeding: Bleeding is more common if the patient is taking blood thinners such as  aspirin, Coumadin, Ticiid, Plavix, etc., or if he/she have some genetic predisposition  such as hemophilia. Bleeding into the spinal canal can cause compression of the spinal  cord with subsequent paralysis.  This would require an emergency surgery to  decompress and there are no guarantees that the patient would recover from the  paralysis.       5. Pneumothorax:  Puncturing of a lung is a possibility, every time a needle is introduced in  the area of the chest or upper back.  Pneumothorax refers to free air around the  collapsed lung(s), inside of the thoracic cavity (chest cavity).  Another two possible  complications related to a similar event would include: Hemothorax and Chylothorax.   These are variations of the Pneumothorax, where instead of air around the collapsed  lung(s), you may have blood or chyle, respectively.       6. Spinal headaches: They may occur with any procedures in the area of the spine.       7. Persistent CSF (Cerebro-Spinal Fluid) leakage: This is a rare problem, but may occur  with prolonged intrathecal or epidural catheters either due to the formation of a fistulous  track  or a dural tear.       8. Nerve damage: By working so close to the spinal cord, there is always a possibility of  nerve damage, which could be as serious as a permanent spinal cord injury with  paralysis.       9. Death:  Although rare, severe deadly allergic reactions known as "Anaphylactic  reaction" can occur to any of the medications used.      10. Worsening of the symptoms:  We can always make thing worse.  What are the chances of  something like this happening? Chances of any of this occuring are extremely low.  By statistics, you have more of a chance of getting killed in a motor vehicle accident: while driving to the hospital than any of the above occurring .  Nevertheless, you should be aware that they are possibilities.  In general, it is similar to taking a shower.  Everybody knows that you can slip, hit your head and get killed.  Does that mean that you should not shower again?  Nevertheless always keep in mind that statistics do not mean anything if you happen to be on the wrong side of them.  Even if a procedure has a 1 (one) in a 1,000,000 (million) chance of going wrong, it you happen to be that one..Also, keep in mind that by statistics, you have more of a chance of having something go wrong when taking medications.  Who should not have this procedure? If you are on a blood thinning medication (e.g. Coumadin, Plavix, see list of "Blood Thinners"), or if you have an active infection going on, you should not have the procedure.  If you are taking any blood thinners, please inform your physician.  How should I prepare for this procedure?  Do not eat or drink anything at least six hours prior to the procedure.  Bring a driver with you .  It cannot be a taxi.  Come accompanied by an adult that can drive you back, and that is strong enough to help you if your legs get weak or numb from the local anesthetic.  Take all of your medicines the morning of the procedure with just enough water to swallow them.  If you have diabetes, make sure that you are scheduled to have your procedure done first thing in the morning, whenever possible.  If you have diabetes, take only half of your insulin dose and notify our nurse that you have done so as soon as you arrive at the clinic.  If you are diabetic, but only take blood sugar pills (oral hypoglycemic), then do not take them on the morning of your procedure.  You may take them  after you have had the procedure.  Do not take aspirin or any aspirin-containing medications, at least eleven (11) days prior to the procedure.  They may prolong bleeding.  Wear loose fitting clothing that may be easy to take off and that you would not mind if it got stained with Betadine or blood.  Do not wear any jewelry or perfume  Remove any nail coloring.  It will interfere with some of our monitoring equipment.  NOTE: Remember that this is not meant to be interpreted as a complete list of all possible complications.  Unforeseen problems may occur.  BLOOD THINNERS The following drugs contain aspirin or other products, which can cause increased bleeding during surgery and should not be taken for 2 weeks prior to and 1 week after surgery.  If you should need take something for relief of minor pain, you may take acetaminophen which is found in Tylenol,m Datril, Anacin-3 and Panadol. It is not blood thinner. The products listed below are.  Do not take any of the products listed below in addition to any listed on your instruction sheet.  A.P.C or A.P.C with Codeine Codeine Phosphate Capsules #3 Ibuprofen Ridaura  ABC compound Congesprin Imuran rimadil  Advil Cope Indocin Robaxisal  Alka-Seltzer Effervescent Pain Reliever and Antacid Coricidin or Coricidin-D  Indomethacin Rufen  Alka-Seltzer plus Cold Medicine Cosprin Ketoprofen S-A-C Tablets  Anacin Analgesic Tablets or Capsules Coumadin Korlgesic Salflex  Anacin Extra Strength Analgesic tablets or capsules CP-2 Tablets Lanoril Salicylate  Anaprox Cuprimine Capsules Levenox Salocol  Anexsia-D Dalteparin Magan Salsalate  Anodynos Darvon compound Magnesium Salicylate Sine-off  Ansaid Dasin Capsules Magsal Sodium Salicylate  Anturane Depen Capsules Marnal Soma  APF Arthritis pain formula Dewitt's Pills Measurin Stanback  Argesic Dia-Gesic Meclofenamic Sulfinpyrazone  Arthritis Bayer Timed Release Aspirin Diclofenac Meclomen Sulindac   Arthritis pain formula Anacin Dicumarol Medipren Supac  Analgesic (Safety coated) Arthralgen Diffunasal Mefanamic Suprofen  Arthritis Strength Bufferin Dihydrocodeine Mepro Compound Suprol  Arthropan liquid Dopirydamole Methcarbomol with Aspirin Synalgos  ASA tablets/Enseals Disalcid Micrainin Tagament  Ascriptin Doan's Midol Talwin  Ascriptin A/D Dolene Mobidin Tanderil  Ascriptin Extra Strength Dolobid Moblgesic Ticlid  Ascriptin with Codeine Doloprin or Doloprin with Codeine Momentum Tolectin  Asperbuf Duoprin Mono-gesic Trendar  Aspergum Duradyne Motrin or Motrin IB Triminicin  Aspirin plain, buffered or enteric coated Durasal Myochrisine Trigesic  Aspirin Suppositories Easprin Nalfon Trillsate  Aspirin with Codeine Ecotrin Regular or Extra Strength Naprosyn Uracel  Atromid-S Efficin Naproxen Ursinus  Auranofin Capsules Elmiron Neocylate Vanquish  Axotal Emagrin Norgesic Verin  Azathioprine Empirin or Empirin with Codeine Normiflo Vitamin E  Azolid Emprazil Nuprin Voltaren  Bayer Aspirin plain, buffered or children's or timed BC Tablets or powders Encaprin Orgaran Warfarin Sodium  Buff-a-Comp Enoxaparin Orudis Zorpin  Buff-a-Comp with Codeine Equegesic Os-Cal-Gesic   Buffaprin Excedrin plain, buffered or Extra Strength Oxalid   Bufferin Arthritis Strength Feldene Oxphenbutazone   Bufferin plain or Extra Strength Feldene Capsules Oxycodone with Aspirin   Bufferin with Codeine Fenoprofen Fenoprofen Pabalate or Pabalate-SF   Buffets II Flogesic Panagesic   Buffinol plain or Extra Strength Florinal or Florinal with Codeine Panwarfarin   Buf-Tabs Flurbiprofen Penicillamine   Butalbital Compound Four-way cold tablets Penicillin   Butazolidin Fragmin Pepto-Bismol   Carbenicillin Geminisyn Percodan   Carna Arthritis Reliever Geopen Persantine   Carprofen Gold's salt Persistin   Chloramphenicol Goody's Phenylbutazone   Chloromycetin Haltrain Piroxlcam   Clmetidine heparin Plaquenil    Cllnoril Hyco-pap Ponstel   Clofibrate Hydroxy chloroquine Propoxyphen         Before stopping any of these medications, be sure to consult the physician who ordered them.  Some, such as Coumadin (Warfarin) are ordered to prevent or treat serious conditions such as "deep thrombosis", "pumonary embolisms", and other heart problems.  The amount of time that you may need off of the medication may also vary with the medication and the reason for which you were taking it.  If you are taking any of these medications, please make sure you notify your pain physician before you undergo any procedures.         We discussed patient's condition on today's visit and we will continue present medication consisting of hydrocodone acetaminophen. We'll also schedule patient for neurosurgical evaluation assessment of patient's condition as discussed and will proceed with  interventional treatment

## 2015-07-25 ENCOUNTER — Other Ambulatory Visit (HOSPITAL_COMMUNITY): Payer: Self-pay | Admitting: Neurosurgery

## 2015-07-25 ENCOUNTER — Other Ambulatory Visit: Payer: Self-pay | Admitting: Internal Medicine

## 2015-07-25 DIAGNOSIS — M4310 Spondylolisthesis, site unspecified: Secondary | ICD-10-CM

## 2015-08-01 ENCOUNTER — Ambulatory Visit: Payer: BLUE CROSS/BLUE SHIELD | Attending: Pain Medicine | Admitting: Pain Medicine

## 2015-08-01 ENCOUNTER — Encounter: Payer: Self-pay | Admitting: Pain Medicine

## 2015-08-01 VITALS — BP 107/91 | HR 75 | Temp 98.3°F | Resp 16 | Wt 155.0 lb

## 2015-08-01 DIAGNOSIS — M7060 Trochanteric bursitis, unspecified hip: Secondary | ICD-10-CM

## 2015-08-01 DIAGNOSIS — M533 Sacrococcygeal disorders, not elsewhere classified: Secondary | ICD-10-CM

## 2015-08-01 DIAGNOSIS — M51369 Other intervertebral disc degeneration, lumbar region without mention of lumbar back pain or lower extremity pain: Secondary | ICD-10-CM

## 2015-08-01 DIAGNOSIS — M47816 Spondylosis without myelopathy or radiculopathy, lumbar region: Secondary | ICD-10-CM

## 2015-08-01 DIAGNOSIS — M48062 Spinal stenosis, lumbar region with neurogenic claudication: Secondary | ICD-10-CM

## 2015-08-01 DIAGNOSIS — M79659 Pain in unspecified thigh: Secondary | ICD-10-CM

## 2015-08-01 DIAGNOSIS — G5790 Unspecified mononeuropathy of unspecified lower limb: Secondary | ICD-10-CM

## 2015-08-01 DIAGNOSIS — M5136 Other intervertebral disc degeneration, lumbar region: Secondary | ICD-10-CM

## 2015-08-01 DIAGNOSIS — R252 Cramp and spasm: Secondary | ICD-10-CM

## 2015-08-01 DIAGNOSIS — M5416 Radiculopathy, lumbar region: Secondary | ICD-10-CM

## 2015-08-01 DIAGNOSIS — M79604 Pain in right leg: Secondary | ICD-10-CM

## 2015-08-01 MED ORDER — HYDROCODONE-ACETAMINOPHEN 5-325 MG PO TABS: ORAL_TABLET | ORAL | Status: DC

## 2015-08-01 NOTE — Patient Instructions (Signed)
PLAN   Continue present medication hydrocodone acetaminophen  F/U PCP Dr. Ronette Deter for evaliation of  BP and general medical  condition   F/U surgical evaluation.. Patient will proceed with neurosurgical evaluation with Dr. Trenton Gammon to discuss MRI results  MRI scheduled for this week  F/U neurological evaluation. May consider PNCV/EMG studies and other studies pending follow-up evaluations  F/U  psych evaluation  May consider radiofrequency rhizolysis or intraspinal procedures pending response to present treatment and F/U evaluation   Patient to call [facility] should patient have concerns prior to scheduled return appointment

## 2015-08-01 NOTE — Progress Notes (Signed)
Safety precautions to be maintained throughout the outpatient stay will include: orient to surroundings, keep bed in low position, maintain call bell within reach at all times, provide assistance with transfer out of bed and ambulation.  

## 2015-08-01 NOTE — Progress Notes (Signed)
   Subjective:    Patient ID: Hannah [name], female    DOB: [birth year], 64 y.o.   MRN: [id number]  HPI  Patient is a 65 year old female who returns to pain management for further evaluation and treatment of pain involving the region of lower back lower extremity region predominantly. The patient is scheduled to undergo follow-up evaluation with Dr. Trenton Gammon for neurosurgical be assessment of patient's condition. At the present time we will continue present treatment regimen consisting of oxycodone and we will avoid interventional treatment.. The patient will also undergo neurological reassessment of her memory as discussed and as well. We will remain available to consider modification of treatment regimen pending surgical disposition of Dr. Trenton Gammon and pending neurological reevaluation the patient is well. All agreed to suggested treatment plan  Review of Systems     Objective:   Physical Exam  There was tends to palpation of paraspinal muscles and cervical cervical facet region a mild degree with mild tenderness of the splenius capitis and occipitalis musculature region. Palpation over the region of the thoracic facet thoracic paraspinal misreading was with evidence of moderate muscle spasm involving the mid and lower thoracic region. No crepitus of the thoracic region was noted  Palpation of the acromioclavicular and glenohumeral joint regions reproduce mild discomfort and patient appeared to be unremarkable Spurling's maneuver and unremarkable drop test. Palpation thoracic region was attends to palpation of muscle spasm of the lower thoracic region with palpation over the lumbar paraspinal must reason lumbar facet region associated with moderate discomfort on the left as well as on the right. Lateral bending rotation extension and palpation the lumbar facets reproduce moderate discomfort. Straight leg raise was tolerates approximately 30 without a definite increase of pain with dorsiflexion noted. EHL  to appeared to be decreased. No definite sensory deficit or dermatomal distribution detected. Knees appeared to be trace at the knees. There was negative clonus negative Homans. Abdomen nontender with no costovertebral tenderness noted     Assessment & Plan:     Degenerative disc disease lumbar spine Kyphotic curvature at the 1112 disc space, spondylosis and shallow disc bulge at this level resulting in mild canal and moderate bilateral foraminal stenosis and mild anterior and superior T12 upper endplate depression, lack of marrow edema indicative of chronic change with mild osseous and disc changes at the remaining levels with adrenal nodule. Disc desiccation central right with the central disc bulge resulting and right foraminal narrowing L3-4 level with moderate left and right facet arthrosis. L4-L5 disc desiccation. Shallow broad-based disc bulging and narrowing of the central canal and left foramen at L4-L5 level  Lumbar radiculopathy  Lumbar facet syndrome  Greater trochanteric bursitis  Sacroiliac joint dysfunction     PLAN   Continue present medication hydrocodone acetaminophen  F/U PCP Dr. Ronette Deter for evaliation of  BP and general medical  condition   F/U surgical evaluation.. Patient will proceed with neurosurgical evaluation with Dr. Trenton Gammon to discuss MRI results  MRI scheduled for this week  F/U neurological evaluation. May consider PNCV/EMG studies and other studies pending follow-up evaluations  F/U  psych evaluation  May consider radiofrequency rhizolysis or intraspinal procedures pending response to present treatment and F/U evaluation   Patient to call [facility] should patient have concerns prior to scheduled return appointment

## 2015-08-10 LAB — TOXASSURE SELECT 13 (MW), URINE

## 2015-08-12 NOTE — Progress Notes (Signed)
Quick Note:  Reviewed. ______ 

## 2015-08-15 ENCOUNTER — Ambulatory Visit
Admission: RE | Admit: 2015-08-15 | Discharge: 2015-08-15 | Disposition: A | Discharge status: Home / Self Care | Payer: BLUE CROSS/BLUE SHIELD | Source: Ambulatory Visit | Attending: Neurosurgery | Admitting: Neurosurgery

## 2015-08-15 DIAGNOSIS — M4310 Spondylolisthesis, site unspecified: Secondary | ICD-10-CM | POA: Diagnosis present

## 2015-08-15 DIAGNOSIS — M4316 Spondylolisthesis, lumbar region: Secondary | ICD-10-CM | POA: Insufficient documentation

## 2015-08-15 DIAGNOSIS — M5136 Other intervertebral disc degeneration, lumbar region: Secondary | ICD-10-CM | POA: Diagnosis not present

## 2015-08-15 DIAGNOSIS — M4806 Spinal stenosis, lumbar region: Secondary | ICD-10-CM | POA: Insufficient documentation

## 2015-08-22 ENCOUNTER — Other Ambulatory Visit: Payer: Self-pay | Admitting: Internal Medicine

## 2015-08-29 ENCOUNTER — Encounter: Payer: Self-pay | Admitting: Psychiatry

## 2015-08-29 ENCOUNTER — Ambulatory Visit: Payer: BLUE CROSS/BLUE SHIELD | Attending: Pain Medicine | Admitting: Pain Medicine

## 2015-08-29 ENCOUNTER — Ambulatory Visit (INDEPENDENT_AMBULATORY_CARE_PROVIDER_SITE_OTHER): Payer: BLUE CROSS/BLUE SHIELD | Admitting: Psychiatry

## 2015-08-29 ENCOUNTER — Encounter: Payer: Self-pay | Admitting: Pain Medicine

## 2015-08-29 VITALS — BP 129/43 | HR 76 | Temp 98.4°F | Resp 14 | Ht 60.0 in | Wt 150.0 lb

## 2015-08-29 VITALS — BP 142/82 | HR 77 | Temp 98.4°F | Ht 60.0 in | Wt 156.0 lb

## 2015-08-29 DIAGNOSIS — M47896 Other spondylosis, lumbar region: Secondary | ICD-10-CM | POA: Insufficient documentation

## 2015-08-29 DIAGNOSIS — M533 Sacrococcygeal disorders, not elsewhere classified: Secondary | ICD-10-CM

## 2015-08-29 DIAGNOSIS — F32A Depression, unspecified: Secondary | ICD-10-CM

## 2015-08-29 DIAGNOSIS — M7060 Trochanteric bursitis, unspecified hip: Secondary | ICD-10-CM

## 2015-08-29 DIAGNOSIS — F329 Major depressive disorder, single episode, unspecified: Secondary | ICD-10-CM | POA: Diagnosis not present

## 2015-08-29 DIAGNOSIS — M51369 Other intervertebral disc degeneration, lumbar region without mention of lumbar back pain or lower extremity pain: Secondary | ICD-10-CM

## 2015-08-29 DIAGNOSIS — M5126 Other intervertebral disc displacement, lumbar region: Secondary | ICD-10-CM | POA: Diagnosis not present

## 2015-08-29 DIAGNOSIS — M4806 Spinal stenosis, lumbar region: Secondary | ICD-10-CM | POA: Diagnosis not present

## 2015-08-29 DIAGNOSIS — M545 Low back pain: Secondary | ICD-10-CM | POA: Diagnosis present

## 2015-08-29 DIAGNOSIS — M47814 Spondylosis without myelopathy or radiculopathy, thoracic region: Secondary | ICD-10-CM | POA: Insufficient documentation

## 2015-08-29 DIAGNOSIS — M5136 Other intervertebral disc degeneration, lumbar region: Secondary | ICD-10-CM

## 2015-08-29 DIAGNOSIS — G5790 Unspecified mononeuropathy of unspecified lower limb: Secondary | ICD-10-CM

## 2015-08-29 DIAGNOSIS — M79606 Pain in leg, unspecified: Secondary | ICD-10-CM | POA: Diagnosis present

## 2015-08-29 DIAGNOSIS — F411 Generalized anxiety disorder: Secondary | ICD-10-CM

## 2015-08-29 DIAGNOSIS — M5116 Intervertebral disc disorders with radiculopathy, lumbar region: Secondary | ICD-10-CM | POA: Diagnosis not present

## 2015-08-29 DIAGNOSIS — M47816 Spondylosis without myelopathy or radiculopathy, lumbar region: Secondary | ICD-10-CM

## 2015-08-29 DIAGNOSIS — M79604 Pain in right leg: Secondary | ICD-10-CM

## 2015-08-29 DIAGNOSIS — M5416 Radiculopathy, lumbar region: Secondary | ICD-10-CM

## 2015-08-29 DIAGNOSIS — M48062 Spinal stenosis, lumbar region with neurogenic claudication: Secondary | ICD-10-CM

## 2015-08-29 MED ORDER — PAROXETINE HCL 30 MG PO TABS: 30.0000 mg | ORAL_TABLET | Freq: Every day | ORAL | Status: DC

## 2015-08-29 MED ORDER — HYDROCODONE-ACETAMINOPHEN 5-325 MG PO TABS: ORAL_TABLET | ORAL | Status: DC

## 2015-08-29 NOTE — Progress Notes (Signed)
Patient ID: Hannah [name], female   DOB: 19-Jun-1950, 65 y.o.   MRN: [id number]  [facility] MD/PA/NP OP Progress Note  08/29/2015 10:19 AM Hannah [name]  MRN:  [id number]  Subjective:  Patient returns for follow-up of her panic disorder with agoraphobia and major depressive disorder recurrent moderate.  Patient reports that she has not started taking the trazodone and she just filled within the other day. She states that she continues to feel depressed and thinks about the various deaths in her family. States that her husband had some severe health issues before he died and she continues to feel trouble thinking about them. Has a lot of trouble sleeping. States that she is quite depressed many times. Though she enjoys spending time with her nephews children she feels like her mood fluctuates quite a bit.  She denies any suicidal thoughts.  States that she has taken the Klonopin at 0.5 mg about 4-5 times over the past 2 weeks. She is okay with tapering off the Klonopin over the next 2 weeks.  Chief Complaint: Depressed mood and sleep issues  Visit Diagnosis:     ICD-9-CM ICD-10-CM   1. Depression 311 F32.9   2. GAD (generalized anxiety disorder) 300.02 F41.1     Past Medical History:  Past Medical History  Diagnosis Date  . GERD (gastroesophageal reflux disease)   . Degenerative joint disease of spine   . Hypertension   . Hyperlipidemia   . Depression     Followed in past by Dr. Randel Books  . Hypercholesteremia   . Heart murmur   . Carotid artery occlusion   . Thyroid disease     Past Surgical History  Procedure Laterality Date  . Abdominal hysterectomy    . Cholecystectomy    . Appendectomy    . Vaginal delivery    . Tonsillectomy    . Adenoidectomy    . Broken wrist  Left   . Fracture surgery Left   . Thyroidectomy Right 02/28/2015    Procedure: THYROIDECTOMY;  Surgeon: Carloyn Manner, MD;  Location: [facility] ORS;  Service: ENT;  Laterality: Right;   Family History:  Family History   Problem Relation Age of Onset  . Hypertension Mother   . Hyperlipidemia Mother   . Heart disease Mother   . Anxiety disorder Mother   . Drug abuse Mother   . Cancer Sister     breast  . Social phobia Sister   . Alcohol abuse Father   . Anxiety disorder Father   . Drug abuse Father   . Drug abuse Sister   . Anxiety disorder Sister   . Alcohol abuse Sister   . Breast cancer Sister   . Bipolar disorder Sister    Social History:  Social History   Social History  . Marital Status: Widowed    Spouse Name: N/A  . Number of Children: N/A  . Years of Education: N/A   Social History Main Topics  . Smoking status: Never Smoker   . Smokeless tobacco: Never Used  . Alcohol Use: No  . Drug Use: No  . Sexual Activity: Not Currently   Other Topics Concern  . None   Social History Narrative   Lives with nephew in [place]. Helps with care of 2 grand-nephews.   Husband deceased.      Work - previously as Theatre manager History:   Assessment:   Musculoskeletal: Strength & Muscle Tone: within normal limits Gait & Station: normal Patient  leans: N/A  Psychiatric Specialty Exam: HPI  Review of Systems  Psychiatric/Behavioral: Negative for depression, suicidal ideas, hallucinations, memory loss and substance abuse. The patient is nervous/anxious (states it's responding well to the current medication regimen but she does occasionally have panic attacks) and has insomnia.     Blood pressure 142/82, pulse 77, temperature 98.4 F (36.9 C), height 5' (1.524 m), weight 156 lb (70.761 kg).Body mass index is 30.47 kg/(m^2).  General Appearance: Well Groomed  Eye Contact:  Good  Speech:  Normal Rate  Volume:  Normal  Mood:  depressed  Affect:  Smiling   Thought Process:  Linear and Logical  Orientation:  Full (Time, Place, and Person)  Thought Content:  Negative  Suicidal Thoughts:  No  Homicidal Thoughts:  No  Memory:  Immediate;   Good Recent;    Good Remote;   Good  Judgement:  Good  Insight:  NA  Psychomotor Activity:  Negative  Concentration:  Good  Recall:  Good  Fund of Knowledge: Good  Language: Good  Akathisia:  Negative  Handed:   AIMS (if indicated):    Assets:  Communication Skills Desire for Improvement Social Support  ADL's:  Intact  Cognition: WNL  Sleep:  Or 3-4 hours a night, frequent wakening    Is the patient at risk to self?  No. Has the patient been a risk to self in the past 6 months?  No. Has the patient been a risk to self within the distant past?  No. Is the patient a risk to others?  No. Has the patient been a risk to others in the past 6 months?  No. Has the patient been a risk to others within the distant past?  No.  Current Medications: Current Outpatient Prescriptions  Medication Sig Dispense Refill  . amLODipine (NORVASC) 5 MG tablet Take 1 tablet (5 mg total) by mouth daily. NEEDS APPT FOR ADDITIONAL REFILLS. PLEASE CALL OFFICE SOON 30 tablet 0  . clonazePAM (KLONOPIN) 0.5 MG tablet Take 1 tablet as needed daily for anxiety, try to not take more than 3-4 times in a week. 30 tablet 1  . gabapentin (NEURONTIN) 300 MG capsule TAKE 2 CAPSULES BY MOUTH TWICE DAILY 120 capsule 3  . HYDROcodone-acetaminophen (NORCO/VICODIN) 5-325 MG tablet Limit 1 tab by mouth per day or twice per day if tolerated 50 tablet 0  . hyoscyamine (LEVSIN SL) 0.125 MG SL tablet DISSOLVE ONE TABLET IN MOUTH EVERY 4 HOURS AS NEEDED 30 tablet 0  . naproxen sodium (ANAPROX) 220 MG tablet Take 220 mg by mouth as needed.    Marland Kitchen omeprazole (PRILOSEC) 40 MG capsule TAKE 1 CAPSULE BY MOUTH EVERY DAY 30 capsule 11  . ondansetron (ZOFRAN) 4 MG tablet Take 1 tablet (4 mg total) by mouth every 4 (four) hours as needed for nausea. 20 tablet 0  . PARoxetine (PAXIL) 20 MG tablet Take 1 tablet (20 mg total) by mouth daily. 30 tablet 4  . senna-docusate (SENOKOT-S) 8.6-50 MG tablet Take 1 tablet by mouth at bedtime as needed for mild  constipation. 45 tablet 0  . traZODone (DESYREL) 50 MG tablet Take 1 tablet (50 mg total) by mouth at bedtime. 30 tablet 2   Current Facility-Administered Medications  Medication Dose Route Frequency Provider Last Rate Last Dose  . bupivacaine (PF) (MARCAINE) 0.25 % injection 30 mL  30 mL Other Once Mohammed Kindle, MD      . bupivacaine (PF) (MARCAINE) 0.25 % injection 30 mL  30 mL Other  Once Mohammed Kindle, MD      . ceFAZolin (ANCEF) IVPB 1 g/50 mL premix  1 g Intravenous Once Mohammed Kindle, MD      . ceFAZolin (ANCEF) IVPB 1 g/50 mL premix  1 g Intravenous Once Mohammed Kindle, MD      . ceFAZolin (ANCEF) IVPB 1 g/50 mL premix  1 g Intravenous Once Mohammed Kindle, MD      . fentaNYL (SUBLIMAZE) injection 100 mcg  100 mcg Intravenous Once Mohammed Kindle, MD      . fentaNYL (SUBLIMAZE) injection 100 mcg  100 mcg Intravenous Once Mohammed Kindle, MD      . fentaNYL (SUBLIMAZE) injection 100 mcg  100 mcg Intravenous Once Mohammed Kindle, MD      . fentaNYL (SUBLIMAZE) injection 100 mcg  100 mcg Intravenous Once Mohammed Kindle, MD      . lactated ringers infusion 1,000 mL  1,000 mL Intravenous Continuous Mohammed Kindle, MD      . lactated ringers infusion 1,000 mL  1,000 mL Intravenous Continuous Mohammed Kindle, MD      . lactated ringers infusion 1,000 mL  1,000 mL Intravenous Continuous Mohammed Kindle, MD      . lactated ringers infusion 1,000 mL  1,000 mL Intravenous Continuous Mohammed Kindle, MD      . lidocaine (PF) (XYLOCAINE) 1 % injection 10 mL  10 mL Subcutaneous Once Mohammed Kindle, MD      . lidocaine (PF) (XYLOCAINE) 1 % injection 10 mL  10 mL Subcutaneous Once Mohammed Kindle, MD      . midazolam (VERSED) 5 MG/5ML injection 5 mg  5 mg Intravenous Once Mohammed Kindle, MD      . midazolam (VERSED) 5 MG/5ML injection 5 mg  5 mg Intravenous Once Mohammed Kindle, MD      . midazolam (VERSED) 5 MG/5ML injection 5 mg  5 mg Intravenous Once Mohammed Kindle, MD      . midazolam (VERSED) 5 MG/5ML injection 5 mg  5  mg Intravenous Once Mohammed Kindle, MD      . orphenadrine (NORFLEX) injection 60 mg  60 mg Intramuscular Once Mohammed Kindle, MD      . orphenadrine (NORFLEX) injection 60 mg  60 mg Intramuscular Once Mohammed Kindle, MD      . orphenadrine (NORFLEX) injection 60 mg  60 mg Intramuscular Once Mohammed Kindle, MD      . orphenadrine (NORFLEX) injection 60 mg  60 mg Intramuscular Once Mohammed Kindle, MD      . sodium chloride flush (NS) 0.9 % injection 20 mL  20 mL Other Once Mohammed Kindle, MD      . triamcinolone acetonide (KENALOG-40) injection 40 mg  40 mg Other Once Mohammed Kindle, MD      . triamcinolone acetonide (KENALOG-40) injection 40 mg  40 mg Other Once Mohammed Kindle, MD      . triamcinolone acetonide (KENALOG-40) injection 40 mg  40 mg Other Once Mohammed Kindle, MD      . triamcinolone acetonide (KENALOG-40) injection 40 mg  40 mg Other Once Mohammed Kindle, MD        Medical Decision Making:  Established Problem, Stable/Improving (1), Review of Medication Regimen & Side Effects (2) and Review of New Medication or Change in Dosage (2)  Treatment Plan Summary:Medication management and Plan Plan   Panic disorder with agoraphobia- Increase  Paxil to 30 mg in the morning.  Taper klonopin to 0.5mg  1-2 times a week for 2 weeks and discontinue after 2 weeks.  Major depressive disorder, recurrent, moderate-Paxil as above.   Insomnia- Start trazodone at 50mg  po qhs prn for insomnia  Return to clinic in 3 weeks  time or call before if needed  Mattia Osterman 08/29/2015, 10:19 AM

## 2015-08-29 NOTE — Patient Instructions (Addendum)
PLAN   Continue present medication hydrocodone acetaminophen  F/U PCP Dr. Ronette Deter for evaliation of  BP and general medical  condition   F/U surgical evaluation.. Patient will follow-up with Dr. Trenton Gammon as planned to discuss MRI results  F/U neurological evaluation. May consider PNCV/EMG studies and other studies pending follow-up evaluations  F/U  psych evaluation  May consider radiofrequency rhizolysis or intraspinal procedures pending response to present treatment and F/U evaluation   Patient to call [facility] should patient have concerns prior to scheduled return appointment

## 2015-08-29 NOTE — Progress Notes (Signed)
   Subjective:    Patient ID: Hannah [name], female    DOB: 09-04-1950, 65 y.o.   MRN: [id number]  HPI  The patient is a 65 year old female who returns to pain management for further evaluation and treatment of pain involving the region of the lower back and lower extremity region predominantly the patient is to undergo further evaluation of her lower back and lower extremity pain with Dr. Trenton Gammon neurosurgeon. The patient has had recent lumbar MRI and is to follow-up with Dr. Trenton Gammon for assessment of her lower back and lower extremity pain. We will remain available to consider patient for additional treatment including interventional treatment as well as modification of medications and other aspects of patient's treatment regimen including physical therapy. The patient denied any recent trauma change in events of daily living the call significant change in symptomatology. We will continue hydrocodone acetaminophen at the present dose and we will remain available to consider patient for additional treatment pending follow-up evaluations. All agreed to suggested treatment plan   Review of Systems     Objective:   Physical Exam  There was tenderness to palpation of the paraspinal musculature region of the cervical region cervical facet region a mild degree with mild tenderness of the splenius capitis and occipitalis musculature regions as well as mild tenderness over the region of the thoracic facet thoracic paraspinal must which regions. Palpation of the acromioclavicular and glenohumeral joint regions reproduce mild to moderate discomfort. The patient appeared to be with unremarkable Spurling's maneuver and Tinel and Phalen's maneuver were without increased pain of significant degree. The patient appeared to be with slightly decreased grip strength. Over the lumbar paraspinal musculatures and lumbar facet region was attends to palpation of moderate degree with lateral bending rotation extension and  palpation over the lumbar facets reproducing moderate discomfort. Straight leg raising was limited to 20 without a definite increase of pain with dorsiflexion noted. DTRs were difficult to elicit patient had difficulty relaxing. Palpation over the PSIS and PII S regions reproduce moderate discomfort. There was mild tenderness of the greater trochanteric region iliotibial band region. Abdomen was nontender with no costovertebral tenderness noted      Assessment & Plan:      Degenerative disc disease lumbar spine Kyphotic curvature at the 1112 disc space, spondylosis and shallow disc bulge at this level resulting in mild canal and moderate bilateral foraminal stenosis and mild anterior and superior T12 upper endplate depression, lack of marrow edema indicative of chronic change with mild osseous and disc changes at the remaining levels with adrenal nodule. Disc desiccation central right with the central disc bulge resulting and right foraminal narrowing L3-4 level with moderate left and right facet arthrosis. L4-L5 disc desiccation. Shallow broad-based disc bulging and narrowing of the central canal and left foramen at L4-L5 level  Lumbar radiculopathy  Lumbar facet syndrome  Greater trochanteric bursitis  Sacroiliac joint dysfunction       PLAN   Continue present medication hydrocodone acetaminophen  F/U PCP Dr. Ronette Deter for evaliation of  BP and general medical  condition   F/U surgical evaluation.. Patient will follow-up with Dr. Trenton Gammon as planned to discuss MRI results  F/U neurological evaluation. May consider PNCV/EMG studies and other studies pending follow-up evaluations  F/U  psych evaluation  May consider radiofrequency rhizolysis or intraspinal procedures pending response to present treatment and F/U evaluation   Patient to call [facility] Center should patient have concerns prior to scheduled return appointment

## 2015-09-19 ENCOUNTER — Ambulatory Visit (INDEPENDENT_AMBULATORY_CARE_PROVIDER_SITE_OTHER): Payer: BLUE CROSS/BLUE SHIELD | Admitting: Psychiatry

## 2015-09-19 ENCOUNTER — Encounter: Payer: Self-pay | Admitting: Psychiatry

## 2015-09-19 VITALS — BP 120/82 | HR 97 | Temp 98.9°F | Ht 60.0 in | Wt 154.8 lb

## 2015-09-19 DIAGNOSIS — F329 Major depressive disorder, single episode, unspecified: Secondary | ICD-10-CM

## 2015-09-19 DIAGNOSIS — F411 Generalized anxiety disorder: Secondary | ICD-10-CM | POA: Diagnosis not present

## 2015-09-19 DIAGNOSIS — F32A Depression, unspecified: Secondary | ICD-10-CM

## 2015-09-19 MED ORDER — PAROXETINE HCL 30 MG PO TABS: 30.0000 mg | ORAL_TABLET | Freq: Every day | ORAL | Status: DC

## 2015-09-19 MED ORDER — TRAZODONE HCL 50 MG PO TABS: 50.0000 mg | ORAL_TABLET | Freq: Every day | ORAL | Status: DC

## 2015-09-19 NOTE — Progress Notes (Signed)
Patient ID: Hannah [name], female   DOB: [birth year], 65 y.o.   MRN: [id number]  [facility] MD/PA/NP OP Progress Note  09/19/2015 9:40 AM Hannah [name]  MRN:  [id number]  Subjective:  Patient returns for follow-up of her panic disorder with agoraphobia and major depressive disorder recurrent moderate.  She has been tolerating the increase in Paxil well, denies any side effects. Sleeping better with the Trazodone, continues to wake up couple of times at night. Continues to report depressed mood, denies suicidal thoughts. Continues to have flashbacks of her son and great grand daughter and feels bad about not being with them. States that makes her depressed. States that she misses her son in [place] and her home places [place]. Dates that she currently cannot afford to live there. States that she would like to see her great granddaughter and that would make her happy. States that she is working on having her granddaughter moved to this area.  Chief Complaint: Depressed due to not being with her family Chief Complaint    Follow-up; Medication Refill     Visit Diagnosis:     ICD-9-CM ICD-10-CM   1. Depression 311 F32.9   2. GAD (generalized anxiety disorder) 300.02 F41.1     Past Medical History:  Past Medical History  Diagnosis Date  . GERD (gastroesophageal reflux disease)   . Degenerative joint disease of spine   . Hypertension   . Hyperlipidemia   . Depression     Followed in past by Dr. Randel Books  . Hypercholesteremia   . Heart murmur   . Carotid artery occlusion   . Thyroid disease     Past Surgical History  Procedure Laterality Date  . Abdominal hysterectomy    . Cholecystectomy    . Appendectomy    . Vaginal delivery    . Tonsillectomy    . Adenoidectomy    . Broken wrist  Left   . Fracture surgery Left   . Thyroidectomy Right 02/28/2015    Procedure: THYROIDECTOMY;  Surgeon: Carloyn Manner, MD;  Location: [facility] ORS;  Service: ENT;  Laterality: Right;    Family History:  Family History  Problem Relation Age of Onset  . Hypertension Mother   . Hyperlipidemia Mother   . Heart disease Mother   . Anxiety disorder Mother   . Drug abuse Mother   . Cancer Sister     breast  . Social phobia Sister   . Alcohol abuse Father   . Anxiety disorder Father   . Drug abuse Father   . Drug abuse Sister   . Anxiety disorder Sister   . Alcohol abuse Sister   . Breast cancer Sister   . Bipolar disorder Sister    Social History:  Social History   Social History  . Marital Status: Widowed    Spouse Name: N/A  . Number of Children: N/A  . Years of Education: N/A   Social History Main Topics  . Smoking status: Never Smoker   . Smokeless tobacco: Never Used  . Alcohol Use: No  . Drug Use: No  . Sexual Activity: Not Currently   Other Topics Concern  . None   Social History Narrative   Lives with nephew in [place]. Helps with care of 2 grand-nephews.   Husband deceased.      Work - previously as Theatre manager History:   Assessment:   Musculoskeletal: Strength & Muscle Tone: within normal limits Gait & Station: normal  Patient leans: N/A  Psychiatric Specialty Exam: HPI  Review of Systems  Psychiatric/Behavioral: Negative for depression, suicidal ideas, hallucinations, memory loss and substance abuse. The patient is not nervous/anxious (states it's responding well to the current medication regimen but she does occasionally have panic attacks) and does not have insomnia.     Blood pressure 120/82, pulse 97, temperature 98.9 F (37.2 C), temperature source Tympanic, height 5' (1.524 m), weight 154 lb 12.8 oz (70.217 kg), SpO2 95 %.Body mass index is 30.23 kg/(m^2).  General Appearance: Well Groomed  Eye Contact:  Good  Speech:  Normal Rate  Volume:  Normal  Mood:  depressed  Affect:  Smiling   Thought Process:  Linear and Logical  Orientation:  Full (Time, Place, and Person)  Thought Content:   Negative  Suicidal Thoughts:  No  Homicidal Thoughts:  No  Memory:  Immediate;   Good Recent;   Good Remote;   Good  Judgement:  Good  Insight:  NA  Psychomotor Activity:  Negative  Concentration:  Good  Recall:  Good  Fund of Knowledge: Good  Language: Good  Akathisia:  Negative  Handed:   AIMS (if indicated):    Assets:  Communication Skills Desire for Improvement Social Support  ADL's:  Intact  Cognition: WNL  Sleep:  Better with frequent wakening    Is the patient at risk to self?  No. Has the patient been a risk to self in the past 6 months?  No. Has the patient been a risk to self within the distant past?  No. Is the patient a risk to others?  No. Has the patient been a risk to others in the past 6 months?  No. Has the patient been a risk to others within the distant past?  No.  Current Medications: Current Outpatient Prescriptions  Medication Sig Dispense Refill  . amLODipine (NORVASC) 5 MG tablet Take 1 tablet (5 mg total) by mouth daily. NEEDS APPT FOR ADDITIONAL REFILLS. PLEASE CALL OFFICE SOON 30 tablet 0  . clonazePAM (KLONOPIN) 0.5 MG tablet Take 1 tablet as needed daily for anxiety, try to not take more than 3-4 times in a week. 30 tablet 1  . gabapentin (NEURONTIN) 300 MG capsule TAKE 2 CAPSULES BY MOUTH TWICE DAILY 120 capsule 3  . HYDROcodone-acetaminophen (NORCO/VICODIN) 5-325 MG tablet Limit 1 tab by mouth per day or twice per day if tolerated 50 tablet 0  . hyoscyamine (LEVSIN SL) 0.125 MG SL tablet DISSOLVE ONE TABLET IN MOUTH EVERY 4 HOURS AS NEEDED 30 tablet 0  . naproxen sodium (ANAPROX) 220 MG tablet Take 220 mg by mouth as needed.    Marland Kitchen. omeprazole (PRILOSEC) 40 MG capsule TAKE 1 CAPSULE BY MOUTH EVERY DAY 30 capsule 11  . ondansetron (ZOFRAN) 4 MG tablet Take 1 tablet (4 mg total) by mouth every 4 (four) hours as needed for nausea. 20 tablet 0  . PARoxetine (PAXIL) 30 MG tablet Take 1 tablet (30 mg total) by mouth daily. 30 tablet 1  . senna-docusate  (SENOKOT-S) 8.6-50 MG tablet Take 1 tablet by mouth at bedtime as needed for mild constipation. 45 tablet 0  . dicyclomine (BENTYL) 10 MG capsule TK 1 C PO QID B MEALS AND ATN  0  . meloxicam (MOBIC) 15 MG tablet TK 1 T PO QD WITH A MEAL  3  . traZODone (DESYREL) 50 MG tablet Take 1 tablet (50 mg total) by mouth at bedtime. 30 tablet 2   Current Facility-Administered Medications  Medication  Dose Route Frequency Provider Last Rate Last Dose  . bupivacaine (PF) (MARCAINE) 0.25 % injection 30 mL  30 mL Other Once Mohammed Kindle, MD      . bupivacaine (PF) (MARCAINE) 0.25 % injection 30 mL  30 mL Other Once Mohammed Kindle, MD      . ceFAZolin (ANCEF) IVPB 1 g/50 mL premix  1 g Intravenous Once Mohammed Kindle, MD      . ceFAZolin (ANCEF) IVPB 1 g/50 mL premix  1 g Intravenous Once Mohammed Kindle, MD      . ceFAZolin (ANCEF) IVPB 1 g/50 mL premix  1 g Intravenous Once Mohammed Kindle, MD      . fentaNYL (SUBLIMAZE) injection 100 mcg  100 mcg Intravenous Once Mohammed Kindle, MD      . fentaNYL (SUBLIMAZE) injection 100 mcg  100 mcg Intravenous Once Mohammed Kindle, MD      . fentaNYL (SUBLIMAZE) injection 100 mcg  100 mcg Intravenous Once Mohammed Kindle, MD      . fentaNYL (SUBLIMAZE) injection 100 mcg  100 mcg Intravenous Once Mohammed Kindle, MD      . lactated ringers infusion 1,000 mL  1,000 mL Intravenous Continuous Mohammed Kindle, MD      . lactated ringers infusion 1,000 mL  1,000 mL Intravenous Continuous Mohammed Kindle, MD      . lactated ringers infusion 1,000 mL  1,000 mL Intravenous Continuous Mohammed Kindle, MD      . lactated ringers infusion 1,000 mL  1,000 mL Intravenous Continuous Mohammed Kindle, MD      . lidocaine (PF) (XYLOCAINE) 1 % injection 10 mL  10 mL Subcutaneous Once Mohammed Kindle, MD      . lidocaine (PF) (XYLOCAINE) 1 % injection 10 mL  10 mL Subcutaneous Once Mohammed Kindle, MD      . midazolam (VERSED) 5 MG/5ML injection 5 mg  5 mg Intravenous Once Mohammed Kindle, MD      . midazolam  (VERSED) 5 MG/5ML injection 5 mg  5 mg Intravenous Once Mohammed Kindle, MD      . midazolam (VERSED) 5 MG/5ML injection 5 mg  5 mg Intravenous Once Mohammed Kindle, MD      . midazolam (VERSED) 5 MG/5ML injection 5 mg  5 mg Intravenous Once Mohammed Kindle, MD      . orphenadrine (NORFLEX) injection 60 mg  60 mg Intramuscular Once Mohammed Kindle, MD      . orphenadrine (NORFLEX) injection 60 mg  60 mg Intramuscular Once Mohammed Kindle, MD      . orphenadrine (NORFLEX) injection 60 mg  60 mg Intramuscular Once Mohammed Kindle, MD      . orphenadrine (NORFLEX) injection 60 mg  60 mg Intramuscular Once Mohammed Kindle, MD      . sodium chloride flush (NS) 0.9 % injection 20 mL  20 mL Other Once Mohammed Kindle, MD      . triamcinolone acetonide (KENALOG-40) injection 40 mg  40 mg Other Once Mohammed Kindle, MD      . triamcinolone acetonide (KENALOG-40) injection 40 mg  40 mg Other Once Mohammed Kindle, MD      . triamcinolone acetonide (KENALOG-40) injection 40 mg  40 mg Other Once Mohammed Kindle, MD      . triamcinolone acetonide (KENALOG-40) injection 40 mg  40 mg Other Once Mohammed Kindle, MD        Medical Decision Making:  Established Problem, Stable/Improving (1), Review of Medication Regimen & Side Effects (2) and Review of New Medication or  Change in Dosage (2)  Treatment Plan Summary:Medication management and Plan Plan   Panic disorder with agoraphobia- continue  Paxil to 30 mg in the morning.   Major depressive disorder, recurrent, moderate-Paxil as above.   Insomnia- Continue trazodone at 50mg  po qhs prn for insomnia  Return to clinic in 2 months  time or call before if needed  Dailynn Nancarrow 09/19/2015, 9:40 AM

## 2015-09-28 ENCOUNTER — Other Ambulatory Visit: Payer: Self-pay | Admitting: Internal Medicine

## 2015-10-01 ENCOUNTER — Encounter: Payer: Self-pay | Admitting: Pain Medicine

## 2015-10-01 ENCOUNTER — Ambulatory Visit: Payer: BLUE CROSS/BLUE SHIELD | Attending: Pain Medicine | Admitting: Pain Medicine

## 2015-10-01 VITALS — HR 77 | Temp 98.2°F | Resp 16 | Ht 60.0 in | Wt 152.0 lb

## 2015-10-01 DIAGNOSIS — M5116 Intervertebral disc disorders with radiculopathy, lumbar region: Secondary | ICD-10-CM | POA: Diagnosis not present

## 2015-10-01 DIAGNOSIS — M4806 Spinal stenosis, lumbar region: Secondary | ICD-10-CM | POA: Insufficient documentation

## 2015-10-01 DIAGNOSIS — M545 Low back pain: Secondary | ICD-10-CM | POA: Diagnosis present

## 2015-10-01 DIAGNOSIS — R41840 Attention and concentration deficit: Secondary | ICD-10-CM

## 2015-10-01 DIAGNOSIS — M79606 Pain in leg, unspecified: Secondary | ICD-10-CM | POA: Diagnosis present

## 2015-10-01 DIAGNOSIS — M7060 Trochanteric bursitis, unspecified hip: Secondary | ICD-10-CM | POA: Diagnosis not present

## 2015-10-01 DIAGNOSIS — M48062 Spinal stenosis, lumbar region with neurogenic claudication: Secondary | ICD-10-CM

## 2015-10-01 DIAGNOSIS — M5126 Other intervertebral disc displacement, lumbar region: Secondary | ICD-10-CM | POA: Diagnosis not present

## 2015-10-01 DIAGNOSIS — F329 Major depressive disorder, single episode, unspecified: Secondary | ICD-10-CM | POA: Diagnosis not present

## 2015-10-01 DIAGNOSIS — M47816 Spondylosis without myelopathy or radiculopathy, lumbar region: Secondary | ICD-10-CM

## 2015-10-01 DIAGNOSIS — G5790 Unspecified mononeuropathy of unspecified lower limb: Secondary | ICD-10-CM

## 2015-10-01 DIAGNOSIS — M533 Sacrococcygeal disorders, not elsewhere classified: Secondary | ICD-10-CM

## 2015-10-01 DIAGNOSIS — M51369 Other intervertebral disc degeneration, lumbar region without mention of lumbar back pain or lower extremity pain: Secondary | ICD-10-CM

## 2015-10-01 DIAGNOSIS — M5416 Radiculopathy, lumbar region: Secondary | ICD-10-CM

## 2015-10-01 DIAGNOSIS — M5136 Other intervertebral disc degeneration, lumbar region: Secondary | ICD-10-CM

## 2015-10-01 DIAGNOSIS — M79604 Pain in right leg: Secondary | ICD-10-CM

## 2015-10-01 DIAGNOSIS — M79659 Pain in unspecified thigh: Secondary | ICD-10-CM

## 2015-10-01 MED ORDER — HYDROCODONE-ACETAMINOPHEN 5-325 MG PO TABS: ORAL_TABLET | ORAL | Status: DC

## 2015-10-01 NOTE — Progress Notes (Signed)
Safety precautions to be maintained throughout the outpatient stay will include: orient to surroundings, keep bed in low position, maintain call bell within reach at all times, provide assistance with transfer out of bed and ambulation.  

## 2015-10-01 NOTE — Progress Notes (Signed)
   Subjective:    Patient ID: Hannah [name], female    DOB: [birth year], 65 y.o.   MRN: [id number]  HPI  The patient is a 65 year old female who returns to pain management for further evaluation and treatment of pain involving the region of the lower back lower extremity region predominantly. The patient is scheduled to undergo further evaluation with Dr. Trenton Gammon with patient being considered for surgical intervention of the lumbar region. We will continue medications as prescribed this time and we will consider modifications of treatment pending follow-up evaluation of patient. At the present time patient is scheduled to undergo surgical intervention of the lumbar region as discussed. All agreed to suggested treatment plan     Review of Systems     Objective:   Physical Exam  There was tenderness of the splenius capitis and occipitalis region palpation of these regions reproduce mild to moderate discomfort with mild to moderate tenderness of the acromial clavicular and glenohumeral joint regions. Patient was attends to palpation of mild to moderate degree of the cervical and thoracic facet region and paraspinal musculature regions. There was tenderness over the thoracic region with moderate tenderness to palpation in the lower thoracic region with no crepitus of the thoracic region noted. Palpation over the lumbar region was without increased pain of moderate to moderately severe degree with lateral bending rotation extension and palpation of the lumbar facets reproducing moderately severe discomfort. Straight leg raise was tolerates approximately 20 without increased pain with dorsiflexion noted. There was no definite sensory deficit of dermatomal distribution detected. DTRs were difficult to elicit. Patient had difficulty relaxing. There was negative clonus negative Homans Abdomen was nontender with no costovertebral angle tenderness noted      Assessment & Plan:      Degenerative disc  disease lumbar spine Kyphotic curvature at the 1112 disc space, spondylosis and shallow disc bulge at this level resulting in mild canal and moderate bilateral foraminal stenosis and mild anterior and superior T12 upper endplate depression, lack of marrow edema indicative of chronic change with mild osseous and disc changes at the remaining levels with adrenal nodule. Disc desiccation central right with the central disc bulge resulting and right foraminal narrowing L3-4 level with moderate left and right facet arthrosis. L4-L5 disc desiccation. Shallow broad-based disc bulging and narrowing of the central canal and left foramen at L4-L5 level  Lumbar radiculopathy  Lumbar facet syndrome  Greater trochanteric bursitis  Sacroiliac joint dysfunction      PLAN   Continue present medication hydrocodone acetaminophen  F/U PCP Dr. Ronette Deter for evaliation of  BP and general medical  condition   F/U surgical evaluation.. Patient will follow-up with Dr. Trenton Gammon. Patient is to undergo surgical intervention of the lumbar region Patient with evidence of degenerative changes of the cervical spine will undergo further evaluation of cervical region and upper extremity pain and paresthesias with evidence of carpal tunnel syndrome as well  F/U neurological evaluation. May consider PNCV/EMG studies and other studies pending follow-up evaluations  F/U  psych evaluation  May consider radiofrequency rhizolysis or intraspinal procedures pending response to present treatment and F/U evaluation   Patient to call [facility] Center should patient have concerns prior to scheduled return appointment

## 2015-10-01 NOTE — Patient Instructions (Addendum)
PLAN   Continue present medication hydrocodone acetaminophen  F/U PCP Dr. Ronette Deter for evaliation of  BP and general medical  condition   F/U surgical evaluation.. Patient will follow-up with Dr. Trenton Gammon. Patient is to undergo surgical intervention of the lumbar region Patient with evidence of degenerative changes of the cervical spine will undergo further evaluation of cervical region and upper extremity pain and paresthesias with evidence of carpal tunnel syndrome as well  F/U neurological evaluation. May consider PNCV/EMG studies and other studies pending follow-up evaluations  F/U  psych evaluation  May consider radiofrequency rhizolysis or intraspinal procedures pending response to present treatment and F/U evaluation   Patient to call [facility] Center should patient have concerns prior to scheduled return appointment

## 2015-10-24 ENCOUNTER — Other Ambulatory Visit: Payer: Self-pay | Admitting: Internal Medicine

## 2015-10-25 NOTE — Telephone Encounter (Signed)
Refill

## 2015-10-31 ENCOUNTER — Ambulatory Visit: Payer: BLUE CROSS/BLUE SHIELD | Attending: Pain Medicine | Admitting: Pain Medicine

## 2015-10-31 ENCOUNTER — Encounter: Payer: Self-pay | Admitting: Pain Medicine

## 2015-10-31 VITALS — BP 134/71 | HR 82 | Temp 98.3°F | Resp 18 | Ht 60.0 in | Wt 150.0 lb

## 2015-10-31 DIAGNOSIS — M533 Sacrococcygeal disorders, not elsewhere classified: Secondary | ICD-10-CM | POA: Insufficient documentation

## 2015-10-31 DIAGNOSIS — M48062 Spinal stenosis, lumbar region with neurogenic claudication: Secondary | ICD-10-CM

## 2015-10-31 DIAGNOSIS — E278 Other specified disorders of adrenal gland: Secondary | ICD-10-CM | POA: Diagnosis not present

## 2015-10-31 DIAGNOSIS — M7060 Trochanteric bursitis, unspecified hip: Secondary | ICD-10-CM | POA: Insufficient documentation

## 2015-10-31 DIAGNOSIS — M47816 Spondylosis without myelopathy or radiculopathy, lumbar region: Secondary | ICD-10-CM

## 2015-10-31 DIAGNOSIS — M79606 Pain in leg, unspecified: Secondary | ICD-10-CM | POA: Diagnosis present

## 2015-10-31 DIAGNOSIS — M5116 Intervertebral disc disorders with radiculopathy, lumbar region: Secondary | ICD-10-CM | POA: Insufficient documentation

## 2015-10-31 DIAGNOSIS — M5126 Other intervertebral disc displacement, lumbar region: Secondary | ICD-10-CM | POA: Insufficient documentation

## 2015-10-31 DIAGNOSIS — M4726 Other spondylosis with radiculopathy, lumbar region: Secondary | ICD-10-CM | POA: Insufficient documentation

## 2015-10-31 DIAGNOSIS — M4806 Spinal stenosis, lumbar region: Secondary | ICD-10-CM | POA: Insufficient documentation

## 2015-10-31 DIAGNOSIS — M545 Low back pain: Secondary | ICD-10-CM | POA: Diagnosis present

## 2015-10-31 MED ORDER — HYDROCODONE-ACETAMINOPHEN 5-325 MG PO TABS: ORAL_TABLET | ORAL | 0 refills | Status: DC

## 2015-10-31 NOTE — Patient Instructions (Addendum)
PLAN   Continue present medication hydrocodone acetaminophen  F/U PCP Dr. Ronette Deter for evaliation of  BP and general medical  condition   F/U surgical evaluation.. Patient will follow-up with Dr. Trenton Gammon. Patient is to undergo surgical intervention of the lumbar region and lower extremity region Patient with evidence of degenerative changes of the cervical spine will undergo further evaluation of cervical region and upper extremity pain and paresthesias with evidence of carpal tunnel syndrome as well  F/U neurological evaluation. May consider PNCV/EMG studies and other studies pending follow-up evaluations  Physical therapy as recommended at this time  F/U  psych evaluation  May consider radiofrequency rhizolysis or intraspinal procedures pending response to present treatment and F/U evaluation   Patient to call [facility] Center should patient have concerns prior to scheduled return appointment  Norco script given to patient

## 2015-10-31 NOTE — Progress Notes (Signed)
     The patient is a 65 year old female who returns to pain management for further evaluation and treatment of pain involving the lumbar and lower extremity region predominantly the patient is undergone surgical evaluation and will follow-up with Dr. Trenton Gammon for reevaluation: The patient is with pain in the cervical region as well. We will avoid interventional treatment we'll continue medications as prescribed this time and await further assessment of patient's condition. Patient was with understanding and agreement suggested treatment plan     Physical examination  There was tenderness over the region cervical facet cervical paraspinal musculature region palpation which reproduces moderate discomfort. There was moderate tenderness of the splenius capitis and occipitalis regions as well palpation of the acromioclavicular and glenohumeral joint regions reproduced pain of mild degree and patient was with mild difficulty performed the drop test. Patient appeared to be with bilaterally equal grip strength without significant increase of pain with Tinel and Phalen's maneuver. Palpation of the thoracic region was attends to palpation with no crepitus of the thoracic region noted. Palpation over the lumbar region was attends to palpation of moderate degree with lateral bending rotation extension and palpation of the lumbar facets reproduced moderate discomfort straight leg raise was tolerates approximately 20 without a definite increased pain with dorsiflexion noted EHL strength appeared to be decreased. No definite sensory deficit of dermatomal distribution detected. DTRs were difficult to elicit appeared to be trace at the knees. There was negative clonus negative Homans. Abdomen nontender with no costovertebral tenderness noted     Assessment     Degenerative disc disease lumbar spine Kyphotic curvature at the 1112 disc space, spondylosis and shallow disc bulge at this level resulting in mild canal  and moderate bilateral foraminal stenosis and mild anterior and superior T12 upper endplate depression, lack of marrow edema indicative of chronic change with mild osseous and disc changes at the remaining levels with adrenal nodule. Disc desiccation central right with the central disc bulge resulting and right foraminal narrowing L3-4 level with moderate left and right facet arthrosis. L4-L5 disc desiccation. Shallow broad-based disc bulging and narrowing of the central canal and left foramen at L4-L5 level  Lumbar radiculopathy  Lumbar facet syndrome  Greater trochanteric bursitis  Sacroiliac joint dysfunction      PLAN   Continue present medication hydrocodone acetaminophen  F/U PCP Dr. Ronette Deter for evaliation of  BP and general medical  condition   F/U surgical evaluation.. Patient will follow-up with Dr. Trenton Gammon. Patient is to undergo surgical intervention of the lumbar region and lower extremity region Patient with evidence of degenerative changes of the cervical spine will undergo further evaluation of cervical region and upper extremity pain and paresthesias with evidence of carpal tunnel syndrome as well  F/U neurological evaluation. May consider PNCV/EMG studies and other studies pending follow-up evaluations  Physical therapy as recommended at this time  F/U  psych evaluation  May consider radiofrequency rhizolysis or intraspinal procedures pending response to present treatment and F/U evaluation   Patient to call Pain Management Center should patient have concerns prior to scheduled return appointment

## 2015-10-31 NOTE — Progress Notes (Signed)
Safety precautions to be maintained throughout the outpatient stay will include: orient to surroundings, keep bed in low position, maintain call bell within reach at all times, provide assistance with transfer out of bed and ambulation.  

## 2015-11-05 ENCOUNTER — Ambulatory Visit: Payer: Self-pay | Admitting: Family

## 2015-11-08 ENCOUNTER — Telehealth: Payer: Self-pay

## 2015-11-08 NOTE — Telephone Encounter (Signed)
Refill request for Gabapentin from [facility], last fill was 06/19/15 with 3 refills. Use to see Dr Lilly Cove

## 2015-11-10 ENCOUNTER — Telehealth: Payer: Self-pay | Admitting: Family

## 2015-11-10 NOTE — Telephone Encounter (Signed)
Please call patient-  She has not been seen in 2017.   She needs an OV for refill of gabapentin and to establish care. Last appt was 01/2015 with Dr. Gilford Rile.

## 2015-11-11 NOTE — Telephone Encounter (Signed)
Left message for patient to return call back for an appointment to refill medication.

## 2015-11-12 ENCOUNTER — Encounter: Payer: Self-pay | Admitting: Family

## 2015-11-12 ENCOUNTER — Ambulatory Visit (INDEPENDENT_AMBULATORY_CARE_PROVIDER_SITE_OTHER): Payer: BLUE CROSS/BLUE SHIELD | Admitting: Family

## 2015-11-12 ENCOUNTER — Other Ambulatory Visit: Payer: Self-pay | Admitting: Family

## 2015-11-12 VITALS — BP 106/66 | HR 84 | Temp 98.2°F | Resp 16 | Ht 60.0 in | Wt 150.0 lb

## 2015-11-12 DIAGNOSIS — R197 Diarrhea, unspecified: Secondary | ICD-10-CM

## 2015-11-12 DIAGNOSIS — R109 Unspecified abdominal pain: Secondary | ICD-10-CM

## 2015-11-12 DIAGNOSIS — Z1231 Encounter for screening mammogram for malignant neoplasm of breast: Secondary | ICD-10-CM | POA: Diagnosis not present

## 2015-11-12 DIAGNOSIS — M79669 Pain in unspecified lower leg: Secondary | ICD-10-CM | POA: Diagnosis not present

## 2015-11-12 LAB — COMPREHENSIVE METABOLIC PANEL
ALBUMIN: 4.9 g/dL (ref 3.5–5.2)
ALK PHOS: 100 U/L (ref 39–117)
ALT: 24 U/L (ref 0–35)
AST: 23 U/L (ref 0–37)
BUN: 23 mg/dL (ref 6–23)
CALCIUM: 9.3 mg/dL (ref 8.4–10.5)
CO2: 29 mEq/L (ref 19–32)
Chloride: 101 mEq/L (ref 96–112)
Creatinine, Ser: 1.42 mg/dL — ABNORMAL HIGH (ref 0.40–1.20)
GFR: 39.61 mL/min — AB (ref >60.00)
Glucose, Bld: 85 mg/dL (ref 70–99)
Potassium: 4.6 mEq/L (ref 3.5–5.1)
Sodium: 139 mEq/L (ref 135–145)
TOTAL PROTEIN: 7.6 g/dL (ref 6.0–8.3)
Total Bilirubin: 0.6 mg/dL (ref 0.2–1.2)

## 2015-11-12 LAB — CBC WITH DIFFERENTIAL/PLATELET
BASOS ABS: 0.1 10*3/uL (ref 0.0–0.1)
BASOS PCT: 0.7 % (ref 0.0–3.0)
Eosinophils Absolute: 0.2 10*3/uL (ref 0.0–0.7)
Eosinophils Relative: 2.1 % (ref 0.0–5.0)
HEMATOCRIT: 40.2 % (ref 36.0–46.0)
HEMOGLOBIN: 13.9 g/dL (ref 12.0–15.0)
Lymphocytes Relative: 37.6 % (ref 12.0–46.0)
Lymphs Abs: 3.1 10*3/uL (ref 0.7–4.0)
MCHC: 34.5 g/dL (ref 30.0–36.0)
MCV: 92.3 fl (ref 78.0–100.0)
Monocytes Absolute: 0.7 10*3/uL (ref 0.1–1.0)
Monocytes Relative: 9.2 % (ref 3.0–12.0)
NEUTROS ABS: 4.1 10*3/uL (ref 1.4–7.7)
NEUTROS PCT: 50.4 % (ref 43.0–77.0)
Platelets: 310 10*3/uL (ref 150.0–400.0)
RBC: 4.36 Mil/uL (ref 3.87–5.11)
RDW: 14.7 % (ref 11.5–15.5)
WBC: 8.1 10*3/uL (ref 4.0–10.5)

## 2015-11-12 LAB — LIPASE: LIPASE: 107 U/L — AB (ref 11.0–59.0)

## 2015-11-12 MED ORDER — GABAPENTIN 300 MG PO CAPS: 600.0000 mg | ORAL_CAPSULE | Freq: Every day | ORAL | 6 refills | Status: DC

## 2015-11-12 NOTE — Assessment & Plan Note (Addendum)
Abdominal cramping is nonspecific at this time. No fever.  I'm very reassured by colonoscopy which patient had earlier this year. Reviewed pathology which was negative for inflammatory bowel disease or colitis. Pending labs to evaluate for acute process. Discussed with patient possible consult to GI for further evaluation.

## 2015-11-12 NOTE — Telephone Encounter (Signed)
Patient is being seen right now in clinic.

## 2015-11-12 NOTE — Addendum Note (Signed)
Addended by: Leeanne Rio on: 11/12/2015 01:42 PM   Modules accepted: Orders

## 2015-11-12 NOTE — Patient Instructions (Signed)
Pending lab work.   Cramping is nonspecific at this time so please keep me informed if anything changes.  If there is no improvement in your symptoms, or if there is any worsening of symptoms, or if you have any additional concerns, please return for re-evaluation; or, if we are closed, consider going to the Emergency Room for evaluation if symptoms urgent.

## 2015-11-12 NOTE — Progress Notes (Signed)
Subjective:    Patient ID: Hannah [name], female    DOB: 1951/03/20, 65 y.o.   MRN: [id number]  CC: Hannah [name] is a 65 y.o. female who presents today for follow up.   HPI: Patient is here for follow-up like a refill of her gabapentin.  Seen at pain management for lumbar spine stenosis. Describes numbness, tingling even 'bug crawling' sensation of LE. Has been on gabapentin for years and with relief. Takes 2 tablets in the morning and 2 at night. Felt 'drunk' when taking it during the day, resolved when started taking at bedtime.   Complains of abdominal cramping for past 2-3 days, intermittent. General pain, not in one area. Goes away on it's own. Describes from sharp to dull to crampy. Per chart review, seen 11/2014 for abdominal pain , negative c diff. No triggers. No bloating, increased burping. Has regular BMs. Endorses diarhea which offers relief. No blood in stool. No dysuria. No h/o lactose intolerance.   She is due for mammogram. Scheduled today. She is up-to-date on colonoscopy 04/2015 which showed 3 polpys which were resected, otherwise normal. Pathology negative for IBD or colitis . H/o hystectomomy. Continue to do pap smears.      HISTORY:  Past Medical History:  Diagnosis Date  . Carotid artery occlusion   . Degenerative joint disease of spine   . Depression    Followed in past by Dr. Randel Books  . GERD (gastroesophageal reflux disease)   . Heart murmur   . Hypercholesteremia   . Hyperlipidemia   . Hypertension   . Thyroid disease    Past Surgical History:  Procedure Laterality Date  . ABDOMINAL HYSTERECTOMY    . ADENOIDECTOMY    . APPENDECTOMY    . broken wrist  Left   . CHOLECYSTECTOMY    . FRACTURE SURGERY Left   . THYROIDECTOMY Right 02/28/2015   Procedure: THYROIDECTOMY;  Surgeon: Carloyn Manner, MD;  Location: [facility] ORS;  Service: ENT;  Laterality: Right;  . TONSILLECTOMY    . VAGINAL DELIVERY     Family History  Problem Relation Age of Onset  .  Hypertension Mother   . Hyperlipidemia Mother   . Heart disease Mother   . Anxiety disorder Mother   . Drug abuse Mother   . Cancer Sister     breast  . Social phobia Sister   . Alcohol abuse Father   . Anxiety disorder Father   . Drug abuse Father   . Drug abuse Sister   . Anxiety disorder Sister   . Alcohol abuse Sister   . Breast cancer Sister   . Bipolar disorder Sister     Allergies: No known allergies Current Outpatient Prescriptions on File Prior to Visit  Medication Sig Dispense Refill  . amLODipine (NORVASC) 5 MG tablet TAKE 1 TABLET(5 MG) BY MOUTH DAILY 90 tablet 0  . HYDROcodone-acetaminophen (NORCO/VICODIN) 5-325 MG tablet Limit 1 tab by mouth per day or twice per day if tolerated 50 tablet 0  . meloxicam (MOBIC) 15 MG tablet TK 1 T PO QD WITH A MEAL  3  . naproxen sodium (ANAPROX) 220 MG tablet Take 220 mg by mouth as needed.    Marland Kitchen omeprazole (PRILOSEC) 40 MG capsule TAKE 1 CAPSULE BY MOUTH EVERY DAY 30 capsule 11  . PARoxetine (PAXIL) 30 MG tablet Take 1 tablet (30 mg total) by mouth daily. 30 tablet 1  . senna-docusate (SENOKOT-S) 8.6-50 MG tablet Take 1 tablet by mouth at bedtime as needed  for mild constipation. 45 tablet 0  . traZODone (DESYREL) 50 MG tablet Take 1 tablet (50 mg total) by mouth at bedtime. 30 tablet 2  . dicyclomine (BENTYL) 10 MG capsule TK 1 C PO QID B MEALS AND ATN  0   Current Facility-Administered Medications on File Prior to Visit  Medication Dose Route Frequency Provider Last Rate Last Dose  . bupivacaine (PF) (MARCAINE) 0.25 % injection 30 mL  30 mL Other Once Mohammed Kindle, MD      . bupivacaine (PF) (MARCAINE) 0.25 % injection 30 mL  30 mL Other Once Mohammed Kindle, MD      . ceFAZolin (ANCEF) IVPB 1 g/50 mL premix  1 g Intravenous Once Mohammed Kindle, MD      . ceFAZolin (ANCEF) IVPB 1 g/50 mL premix  1 g Intravenous Once Mohammed Kindle, MD      . ceFAZolin (ANCEF) IVPB 1 g/50 mL premix  1 g Intravenous Once Mohammed Kindle, MD      .  fentaNYL (SUBLIMAZE) injection 100 mcg  100 mcg Intravenous Once Mohammed Kindle, MD      . fentaNYL (SUBLIMAZE) injection 100 mcg  100 mcg Intravenous Once Mohammed Kindle, MD      . fentaNYL (SUBLIMAZE) injection 100 mcg  100 mcg Intravenous Once Mohammed Kindle, MD      . fentaNYL (SUBLIMAZE) injection 100 mcg  100 mcg Intravenous Once Mohammed Kindle, MD      . lactated ringers infusion 1,000 mL  1,000 mL Intravenous Continuous Mohammed Kindle, MD      . lactated ringers infusion 1,000 mL  1,000 mL Intravenous Continuous Mohammed Kindle, MD      . lactated ringers infusion 1,000 mL  1,000 mL Intravenous Continuous Mohammed Kindle, MD      . lactated ringers infusion 1,000 mL  1,000 mL Intravenous Continuous Mohammed Kindle, MD      . lidocaine (PF) (XYLOCAINE) 1 % injection 10 mL  10 mL Subcutaneous Once Mohammed Kindle, MD      . lidocaine (PF) (XYLOCAINE) 1 % injection 10 mL  10 mL Subcutaneous Once Mohammed Kindle, MD      . midazolam (VERSED) 5 MG/5ML injection 5 mg  5 mg Intravenous Once Mohammed Kindle, MD      . midazolam (VERSED) 5 MG/5ML injection 5 mg  5 mg Intravenous Once Mohammed Kindle, MD      . midazolam (VERSED) 5 MG/5ML injection 5 mg  5 mg Intravenous Once Mohammed Kindle, MD      . midazolam (VERSED) 5 MG/5ML injection 5 mg  5 mg Intravenous Once Mohammed Kindle, MD      . orphenadrine (NORFLEX) injection 60 mg  60 mg Intramuscular Once Mohammed Kindle, MD      . orphenadrine (NORFLEX) injection 60 mg  60 mg Intramuscular Once Mohammed Kindle, MD      . orphenadrine (NORFLEX) injection 60 mg  60 mg Intramuscular Once Mohammed Kindle, MD      . orphenadrine (NORFLEX) injection 60 mg  60 mg Intramuscular Once Mohammed Kindle, MD      . sodium chloride flush (NS) 0.9 % injection 20 mL  20 mL Other Once Mohammed Kindle, MD      . triamcinolone acetonide (KENALOG-40) injection 40 mg  40 mg Other Once Mohammed Kindle, MD      . triamcinolone acetonide (KENALOG-40) injection 40 mg  40 mg Other Once Mohammed Kindle, MD       . triamcinolone acetonide [facility]) injection 40 mg  40 mg Other Once Mohammed Kindle, MD      . triamcinolone acetonide [facility]) injection 40 mg  40 mg Other Once Mohammed Kindle, MD        Social History  Substance Use Topics  . Smoking status: Never Smoker  . Smokeless tobacco: Never Used  . Alcohol use No    Review of Systems  Constitutional: Negative for chills, fever and unexpected weight change.  HENT: Negative for congestion.   Respiratory: Negative for cough.   Cardiovascular: Negative for chest pain, palpitations and leg swelling.  Gastrointestinal: Positive for abdominal pain and constipation. Negative for abdominal distention, anal bleeding, blood in stool, diarrhea, nausea and vomiting.  Musculoskeletal: Negative for arthralgias and myalgias.  Skin: Negative for rash.  Neurological: Negative for headaches.  Hematological: Negative for adenopathy.  Psychiatric/Behavioral: Negative for confusion.      Objective:    BP 106/66 (BP Location: Left Arm, Patient Position: Sitting, Cuff Size: Large)   Pulse 84   Temp 98.2 F (36.8 C) (Oral)   Resp 16   Ht 5' (1.524 m)   Wt 150 lb (68 kg)   LMP  (LMP Unknown)   SpO2 96%   BMI 29.29 kg/m  BP Readings from Last 3 Encounters:  11/12/15 106/66  10/31/15 134/71  09/19/15 120/82   Wt Readings from Last 3 Encounters:  11/12/15 150 lb (68 kg)  10/31/15 150 lb (68 kg)  10/01/15 152 lb (68.9 kg)    Physical Exam  Constitutional: She appears well-developed and well-nourished.  Eyes: Conjunctivae are normal.  Cardiovascular: Normal rate, regular rhythm, normal heart sounds and normal pulses.   Pulmonary/Chest: Effort normal and breath sounds normal. She has no wheezes. She has no rhonchi. She has no rales.  Abdominal: Soft. Normal appearance. She exhibits no distension, no fluid wave, no ascites and no mass. Bowel sounds are increased. There is generalized tenderness. There is no rigidity, no rebound, no guarding, no  CVA tenderness, no tenderness at McBurney's point and negative Murphy's sign.  Generalized mild tenderness. No focal tenderness.   Neurological: She is alert.  Skin: Skin is warm and dry.  Psychiatric: She has a normal mood and affect. Her speech is normal and behavior is normal. Thought content normal.  Vitals reviewed.      Assessment & Plan:   Problem List Items Addressed This Visit      Other   Diarrhea    Abdominal cramping is nonspecific at this time. No fever.  I'm very reassured by colonoscopy which patient had earlier this year. Reviewed pathology which was negative for inflammatory bowel disease or colitis. Pending labs to evaluate for acute process. Discussed with patient possible consult to GI for further evaluation.      Extremity pain    On gabapentin for lower extremity pain related to spinal stenosis of the lumbar spine. Well controlled. Refilled as patient requested.       Other Visit Diagnoses    Abdominal cramping    -  Primary   Relevant Orders   CBC with Differential/Platelet   Comprehensive metabolic panel   Lipase   C. difficile GDH and Toxin A/B   Stool culture   Clostridium difficile culture-fecal   Guiac Stool Card-TAKE HOME       I have discontinued Ms. Chuba's hyoscyamine and ondansetron. I have also changed her gabapentin. Additionally, I am having her maintain her senna-docusate, naproxen sodium, omeprazole, meloxicam, dicyclomine, traZODone, PARoxetine, amLODipine, and HYDROcodone-acetaminophen. We will continue to administer fentaNYL,  lactated ringers, orphenadrine, midazolam, triamcinolone acetonide, bupivacaine (PF), fentaNYL, lactated ringers, midazolam, orphenadrine, triamcinolone acetonide, ceFAZolin, ceFAZolin, bupivacaine (PF), fentaNYL, lactated ringers, lidocaine (PF), midazolam, orphenadrine, triamcinolone acetonide, ceFAZolin, fentaNYL, lactated ringers, lidocaine (PF), orphenadrine, midazolam, triamcinolone acetonide, and sodium  chloride flush.   Meds ordered this encounter  Medications  . gabapentin (NEURONTIN) 300 MG capsule    Sig: Take 2 capsules (600 mg total) by mouth at bedtime.    Dispense:  60 capsule    Refill:  6    Order Specific Question:   Supervising Provider    Answer:   Crecencio Mc [[id number]]    Return precautions given.   Risks, benefits, and alternatives of the medications and treatment plan prescribed today were discussed, and patient expressed understanding.   Education regarding symptom management and diagnosis given to patient on AVS.  Continue to follow with Mable Paris, FNP for routine health maintenance.   Misty Stanley Tapper and I agreed with plan.   Mable Paris, FNP

## 2015-11-12 NOTE — Telephone Encounter (Signed)
Called patient from personal cell phone and left nondetailed message regarding if pain worsens, she needs to go to ER. I had some concern about lab work. She may also call me in the morning and I can review.   If she calls back- please reference result note regarding elevated lipase and decreased renal function.

## 2015-11-12 NOTE — Assessment & Plan Note (Signed)
On gabapentin for lower extremity pain related to spinal stenosis of the lumbar spine. Well controlled. Refilled as patient requested.

## 2015-11-13 ENCOUNTER — Other Ambulatory Visit (INDEPENDENT_AMBULATORY_CARE_PROVIDER_SITE_OTHER): Payer: BLUE CROSS/BLUE SHIELD

## 2015-11-13 ENCOUNTER — Telehealth: Payer: Self-pay

## 2015-11-13 DIAGNOSIS — R109 Unspecified abdominal pain: Secondary | ICD-10-CM

## 2015-11-13 LAB — CBC WITH DIFFERENTIAL/PLATELET
BASOS PCT: 0.5 % (ref 0.0–3.0)
Basophils Absolute: 0 10*3/uL (ref 0.0–0.1)
EOS ABS: 0.2 10*3/uL (ref 0.0–0.7)
EOS PCT: 2.6 % (ref 0.0–5.0)
HCT: 37.3 % (ref 36.0–46.0)
HEMOGLOBIN: 12.5 g/dL (ref 12.0–15.0)
LYMPHS ABS: 2.7 10*3/uL (ref 0.7–4.0)
Lymphocytes Relative: 40.5 % (ref 12.0–46.0)
MCHC: 33.6 g/dL (ref 30.0–36.0)
MCV: 91.6 fl (ref 78.0–100.0)
MONO ABS: 0.6 10*3/uL (ref 0.1–1.0)
Monocytes Relative: 8.7 % (ref 3.0–12.0)
NEUTROS ABS: 3.2 10*3/uL (ref 1.4–7.7)
Neutrophils Relative %: 47.7 % (ref 43.0–77.0)
PLATELETS: 296 10*3/uL (ref 150.0–400.0)
RBC: 4.07 Mil/uL (ref 3.87–5.11)
RDW: 14.2 % (ref 11.5–15.5)
WBC: 6.8 10*3/uL (ref 4.0–10.5)

## 2015-11-13 LAB — COMPREHENSIVE METABOLIC PANEL
ALBUMIN: 4.5 g/dL (ref 3.5–5.2)
ALT: 18 U/L (ref 0–35)
AST: 18 U/L (ref 0–37)
Alkaline Phosphatase: 87 U/L (ref 39–117)
BILIRUBIN TOTAL: 0.5 mg/dL (ref 0.2–1.2)
BUN: 22 mg/dL (ref 6–23)
CALCIUM: 9.1 mg/dL (ref 8.4–10.5)
CHLORIDE: 103 meq/L (ref 96–112)
CO2: 30 meq/L (ref 19–32)
CREATININE: 1.15 mg/dL (ref 0.40–1.20)
GFR: 50.53 mL/min — AB (ref >60.00)
Glucose, Bld: 128 mg/dL — ABNORMAL HIGH (ref 70–99)
Potassium: 4.2 mEq/L (ref 3.5–5.1)
Sodium: 139 mEq/L (ref 135–145)
Total Protein: 7 g/dL (ref 6.0–8.3)

## 2015-11-13 LAB — LIPASE: LIPASE: 43 U/L (ref 11.0–59.0)

## 2015-11-13 NOTE — Telephone Encounter (Signed)
-----   Message from Burnard Hawthorne, [place] sent at 11/12/2015  3:25 PM EDT ----- Please let patient know that recent tests were ABNORMAL with the following advice :  It appears that her kidney function has rapidly decreased since a few months ago- I have concern for acute kidney injury.   Lipase is elevated suggesting possible pancreatitis. I would like for you to be seen in the ED for further evaluation of pancreas enzymes. Typically patients have gnawing left flank pain and elevated white cound however you seem to have pain everywhere so it may not be pancreatitis. In either case, I would like for you to be seen for possible kidney injury and pancreatitis.   If she has no pain and would like to redraw labs tomorrow, we can do that.. However I want to ensure that she is safe overnight.    If unable to reach patient after 3 attempts, please let me know.   Thanks as always!  margaret

## 2015-11-13 NOTE — Addendum Note (Signed)
Addended by: Leeanne Rio on: 11/13/2015 11:24 AM   Modules accepted: Orders

## 2015-11-13 NOTE — Telephone Encounter (Signed)
Patient reports she received your VM. Patient reports her symptoms are stable. Patient reports that she has had coffee this morning. Please advise when she will need to repeat labs. sd

## 2015-11-13 NOTE — Telephone Encounter (Signed)
Pt advised to come in today for repeat labs

## 2015-11-13 NOTE — Telephone Encounter (Signed)
Patient advised as below.  

## 2015-11-14 ENCOUNTER — Telehealth: Payer: Self-pay | Admitting: Family

## 2015-11-14 DIAGNOSIS — R1084 Generalized abdominal pain: Secondary | ICD-10-CM

## 2015-11-14 NOTE — Telephone Encounter (Signed)
CT abdomen ordered. Please let patient know.

## 2015-11-15 NOTE — Telephone Encounter (Signed)
Left detailed message on patients voicemail per patient request.

## 2015-11-18 ENCOUNTER — Ambulatory Visit
Admission: RE | Admit: 2015-11-18 | Discharge: 2015-11-18 | Disposition: A | Discharge status: Home / Self Care | Payer: BLUE CROSS/BLUE SHIELD | Source: Ambulatory Visit | Attending: Family | Admitting: Family

## 2015-11-18 ENCOUNTER — Encounter: Payer: Self-pay | Admitting: Family

## 2015-11-18 DIAGNOSIS — R1084 Generalized abdominal pain: Secondary | ICD-10-CM | POA: Insufficient documentation

## 2015-11-18 DIAGNOSIS — IMO0001 Reserved for inherently not codable concepts without codable children: Secondary | ICD-10-CM | POA: Insufficient documentation

## 2015-11-18 DIAGNOSIS — R911 Solitary pulmonary nodule: Secondary | ICD-10-CM | POA: Insufficient documentation

## 2015-11-18 DIAGNOSIS — K5730 Diverticulosis of large intestine without perforation or abscess without bleeding: Secondary | ICD-10-CM | POA: Insufficient documentation

## 2015-11-19 ENCOUNTER — Ambulatory Visit: Payer: BLUE CROSS/BLUE SHIELD | Attending: Family

## 2015-11-19 ENCOUNTER — Ambulatory Visit: Payer: BLUE CROSS/BLUE SHIELD | Admitting: Psychiatry

## 2015-11-19 ENCOUNTER — Other Ambulatory Visit: Payer: BLUE CROSS/BLUE SHIELD

## 2015-11-19 DIAGNOSIS — R197 Diarrhea, unspecified: Secondary | ICD-10-CM

## 2015-11-23 LAB — STOOL CULTURE

## 2015-11-26 LAB — CLOSTRIDIUM DIFFICILE CULTURE-FECAL

## 2015-11-28 ENCOUNTER — Ambulatory Visit: Payer: Self-pay | Admitting: Pain Medicine

## 2015-12-31 ENCOUNTER — Other Ambulatory Visit: Payer: Self-pay

## 2015-12-31 MED ORDER — AMLODIPINE BESYLATE 5 MG PO TABS: ORAL_TABLET | ORAL | 0 refills | Status: DC

## 2015-12-31 NOTE — Telephone Encounter (Signed)
Medication has been refilled.

## 2016-02-12 ENCOUNTER — Encounter: Payer: Self-pay | Admitting: Family

## 2016-05-08 ENCOUNTER — Other Ambulatory Visit: Payer: Self-pay

## 2016-05-08 MED ORDER — OMEPRAZOLE 40 MG PO CPDR: DELAYED_RELEASE_CAPSULE | ORAL | 6 refills | Status: DC

## 2016-05-22 ENCOUNTER — Other Ambulatory Visit: Payer: Self-pay | Admitting: Family

## 2016-05-26 ENCOUNTER — Other Ambulatory Visit: Payer: Self-pay

## 2016-05-26 DIAGNOSIS — I10 Essential (primary) hypertension: Secondary | ICD-10-CM

## 2016-05-26 MED ORDER — AMLODIPINE BESYLATE 5 MG PO TABS: ORAL_TABLET | ORAL | 1 refills | Status: DC

## 2016-05-26 NOTE — Telephone Encounter (Signed)
LMTCB

## 2016-05-26 NOTE — Telephone Encounter (Signed)
Refilled  Please call pt and advise that she has follow up every 6 months due to h/o HTN.   Havent seen her since august.

## 2016-05-26 NOTE — Telephone Encounter (Signed)
Refilled 12/31/2015 Last Ov: 11/12/2015 Next Ov: not scheduled.   Please advise.

## 2016-05-28 NOTE — Telephone Encounter (Signed)
LMTCB

## 2016-06-01 NOTE — Telephone Encounter (Signed)
Spoke with pt and informed her that Joycelyn Schmid refilled her medcation for 30 days but that she would need to schedule an appt before any more refills could be sent in. Scheduled the pt an appt for 06/03/2016 @ 9:00am. Pt is aware of appt date and time.

## 2016-06-03 ENCOUNTER — Ambulatory Visit: Payer: Self-pay | Admitting: Family

## 2016-06-29 ENCOUNTER — Encounter: Payer: Self-pay | Admitting: Family

## 2016-06-29 ENCOUNTER — Ambulatory Visit (INDEPENDENT_AMBULATORY_CARE_PROVIDER_SITE_OTHER): Payer: BLUE CROSS/BLUE SHIELD | Admitting: Family

## 2016-06-29 VITALS — BP 126/98 | HR 83 | Temp 98.2°F | Ht 60.0 in | Wt 160.2 lb

## 2016-06-29 DIAGNOSIS — M5136 Other intervertebral disc degeneration, lumbar region: Secondary | ICD-10-CM | POA: Diagnosis not present

## 2016-06-29 DIAGNOSIS — R079 Chest pain, unspecified: Secondary | ICD-10-CM | POA: Diagnosis not present

## 2016-06-29 DIAGNOSIS — I10 Essential (primary) hypertension: Secondary | ICD-10-CM

## 2016-06-29 DIAGNOSIS — Z1231 Encounter for screening mammogram for malignant neoplasm of breast: Secondary | ICD-10-CM

## 2016-06-29 DIAGNOSIS — Z1239 Encounter for other screening for malignant neoplasm of breast: Secondary | ICD-10-CM

## 2016-06-29 NOTE — Assessment & Plan Note (Addendum)
New. Intermittent. Appears a/w anxiety, reassured by EKG. However Family h./o heart disease and no exercise at this time. Referral placed to cardiology for further testing

## 2016-06-29 NOTE — Progress Notes (Signed)
Pre visit review using our clinic review tool, if applicable. No additional management support is needed unless otherwise documented below in the visit note. 

## 2016-06-29 NOTE — Assessment & Plan Note (Signed)
DBP elevated. Reassured by EKG. Will monitor and return in 2 weeks.

## 2016-06-29 NOTE — Assessment & Plan Note (Signed)
Chronic. Worsening, impacting QOL. Referral back to pain management and neurosurgery. Discussed PT. She may consider at next visit.

## 2016-06-29 NOTE — Patient Instructions (Addendum)
Pleasure seeing you  Referrals placed  Please monitor blood pressure- goal is < 140/90. We may need to increase your norvasc.   Please return for physical appt and labs, Pap smear.  We placed a referral. Mammogram this year. I asked that you call one the below locations and schedule this when it is convenient for you.   If you have dense breasts, you may ask for 3D mammogram over the traditional 2D mammogram as new evidence suggest 3D is superior. Please note that NOT all insurance companies cover 3D and you may have to pay a higher copay. You may call your insurance company to further clarify your benefits.   Options for [place]  [place], [place]  * [facility] [place] [place] * Note if you ask for 3D mammogram at this location, you must request [place], [place] location*

## 2016-06-29 NOTE — Progress Notes (Signed)
Subjective:    Patient ID: Hannah [name], female    DOB: April 22, 1950, 66 y.o.   MRN: [id number]  CC: Hannah [name] is a 66 y.o. female who presents today for follow up.   HPI:   HTN- compliant with norvasc. Occasionally when lying in bed at night has chest pain, last few seconds. Resolves with cold water. Normally a/w anxiety. No SOB, palpitations, left arm pain. No excercise  Chronic low back pain- has followed with pain clinic in the past, physician left and needs new. Still takes occasional vicodin . Had tried cymbalta with relief however got too expensive. Takes OTC aleve 600mg  once per day. Pain worse with . Notes intermittent numbness and tingling bilateral legs.          MRI 07/2015. Lower lumbar facet athropathy , L4-5. ; [name], Hannah  HISTORY:  Past Medical History:  Diagnosis Date  . Carotid artery occlusion   . Degenerative joint disease of spine   . Depression    Followed in past by Dr. Randel Books  . GERD (gastroesophageal reflux disease)   . Heart murmur   . Hypercholesteremia   . Hyperlipidemia   . Hypertension   . Thyroid disease    Past Surgical History:  Procedure Laterality Date  . ABDOMINAL HYSTERECTOMY    . ADENOIDECTOMY    . APPENDECTOMY    . broken wrist  Left   . CHOLECYSTECTOMY    . FRACTURE SURGERY Left   . THYROIDECTOMY Right 02/28/2015   Procedure: THYROIDECTOMY;  Surgeon: Carloyn Manner, MD;  Location: [facility] ORS;  Service: ENT;  Laterality: Right;  . TONSILLECTOMY    . VAGINAL DELIVERY     Family History  Problem Relation Age of Onset  . Hypertension Mother   . Hyperlipidemia Mother   . Heart disease Mother     MI  . Anxiety disorder Mother   . Drug abuse Mother   . Cancer Sister     breast  . Social phobia Sister   . Alcohol abuse Father   . Anxiety disorder Father   . Drug abuse Father   . Drug abuse Sister   . Anxiety disorder Sister   . Alcohol abuse Sister   . Breast cancer Sister   . Bipolar disorder Sister      Allergies: No known allergies Current Outpatient Prescriptions on File Prior to Visit  Medication Sig Dispense Refill  . amLODipine (NORVASC) 5 MG tablet TAKE 1 TABLET(5 MG) BY MOUTH DAILY 90 tablet 1  . dicyclomine (BENTYL) 10 MG capsule TK 1 C PO QID B MEALS AND ATN  0  . HYDROcodone-acetaminophen (NORCO/VICODIN) 5-325 MG tablet Limit 1 tab by mouth per day or twice per day if tolerated 50 tablet 0  . omeprazole (PRILOSEC) 40 MG capsule TAKE 1 CAPSULE BY MOUTH EVERY DAY 30 capsule 6   Current Facility-Administered Medications on File Prior to Visit  Medication Dose Route Frequency Provider Last Rate Last Dose  . bupivacaine (PF) (MARCAINE) 0.25 % injection 30 mL  30 mL Other Once Mohammed Kindle, MD      . bupivacaine (PF) (MARCAINE) 0.25 % injection 30 mL  30 mL Other Once Mohammed Kindle, MD      . ceFAZolin (ANCEF) IVPB 1 g/50 mL premix  1 g Intravenous Once Mohammed Kindle, MD      . ceFAZolin (ANCEF) IVPB 1 g/50 mL premix  1 g Intravenous Once Mohammed Kindle, MD      . ceFAZolin (ANCEF) IVPB 1  g/50 mL premix  1 g Intravenous Once Mohammed Kindle, MD      . fentaNYL (SUBLIMAZE) injection 100 mcg  100 mcg Intravenous Once Mohammed Kindle, MD      . fentaNYL (SUBLIMAZE) injection 100 mcg  100 mcg Intravenous Once Mohammed Kindle, MD      . fentaNYL (SUBLIMAZE) injection 100 mcg  100 mcg Intravenous Once Mohammed Kindle, MD      . fentaNYL (SUBLIMAZE) injection 100 mcg  100 mcg Intravenous Once Mohammed Kindle, MD      . lactated ringers infusion 1,000 mL  1,000 mL Intravenous Continuous Mohammed Kindle, MD      . lactated ringers infusion 1,000 mL  1,000 mL Intravenous Continuous Mohammed Kindle, MD      . lactated ringers infusion 1,000 mL  1,000 mL Intravenous Continuous Mohammed Kindle, MD      . lactated ringers infusion 1,000 mL  1,000 mL Intravenous Continuous Mohammed Kindle, MD      . lidocaine (PF) (XYLOCAINE) 1 % injection 10 mL  10 mL Subcutaneous Once Mohammed Kindle, MD      . lidocaine (PF)  (XYLOCAINE) 1 % injection 10 mL  10 mL Subcutaneous Once Mohammed Kindle, MD      . midazolam (VERSED) 5 MG/5ML injection 5 mg  5 mg Intravenous Once Mohammed Kindle, MD      . midazolam (VERSED) 5 MG/5ML injection 5 mg  5 mg Intravenous Once Mohammed Kindle, MD      . midazolam (VERSED) 5 MG/5ML injection 5 mg  5 mg Intravenous Once Mohammed Kindle, MD      . midazolam (VERSED) 5 MG/5ML injection 5 mg  5 mg Intravenous Once Mohammed Kindle, MD      . orphenadrine (NORFLEX) injection 60 mg  60 mg Intramuscular Once Mohammed Kindle, MD      . orphenadrine (NORFLEX) injection 60 mg  60 mg Intramuscular Once Mohammed Kindle, MD      . orphenadrine (NORFLEX) injection 60 mg  60 mg Intramuscular Once Mohammed Kindle, MD      . orphenadrine (NORFLEX) injection 60 mg  60 mg Intramuscular Once Mohammed Kindle, MD      . sodium chloride flush (NS) 0.9 % injection 20 mL  20 mL Other Once Mohammed Kindle, MD      . triamcinolone acetonide (KENALOG-40) injection 40 mg  40 mg Other Once Mohammed Kindle, MD      . triamcinolone acetonide (KENALOG-40) injection 40 mg  40 mg Other Once Mohammed Kindle, MD      . triamcinolone acetonide (KENALOG-40) injection 40 mg  40 mg Other Once Mohammed Kindle, MD      . triamcinolone acetonide (KENALOG-40) injection 40 mg  40 mg Other Once Mohammed Kindle, MD        Social History  Substance Use Topics  . Smoking status: Never Smoker  . Smokeless tobacco: Never Used  . Alcohol use No    Review of Systems  Constitutional: Negative for chills and fever.  Eyes: Negative for visual disturbance.  Respiratory: Negative for cough and shortness of breath.   Cardiovascular: Positive for chest pain. Negative for palpitations and leg swelling.  Gastrointestinal: Negative for nausea and vomiting.  Neurological: Negative for headaches.      Objective:    BP (!) 126/98   Pulse 83   Temp 98.2 F (36.8 C) (Oral)   Ht 5' (1.524 m)   Wt 160 lb 3.2 oz (72.7 kg)   LMP  (LMP Unknown)  SpO2 97%   BMI  31.29 kg/m  BP Readings from Last 3 Encounters:  06/29/16 (!) 126/98  11/12/15 106/66  10/31/15 134/71   Wt Readings from Last 3 Encounters:  06/29/16 160 lb 3.2 oz (72.7 kg)  11/12/15 150 lb (68 kg)  10/31/15 150 lb (68 kg)    Physical Exam  Constitutional: She appears well-developed and well-nourished.  Eyes: Conjunctivae are normal.  Cardiovascular: Normal rate, regular rhythm, normal heart sounds and normal pulses.   Pulmonary/Chest: Effort normal and breath sounds normal. She has no wheezes. She has no rhonchi. She has no rales.  Musculoskeletal:       Thoracic back: She exhibits normal range of motion, no tenderness, no bony tenderness, no swelling, no pain and no spasm.       Lumbar back: She exhibits normal range of motion, no tenderness, no bony tenderness, no swelling, no edema, no pain and no spasm.  Full range of motion with flexion, tension, lateral side bends. No bony tenderness. No pain, numbness, tingling elicited with single leg raise bilaterally.     Neurological: She is alert. She has normal strength. No sensory deficit.  Reflex Scores:      Patellar reflexes are 2+ on the right side and 2+ on the left side. Sensation intact bilateral lower extremities. RLE 4/5 and LLE 5/5.   Skin: Skin is warm and dry.  Psychiatric: She has a normal mood and affect. Her speech is normal and behavior is normal. Thought content normal.  Vitals reviewed.      Assessment & Plan:   Problem List Items Addressed This Visit      Cardiovascular and Mediastinum   Essential hypertension, benign    DBP elevated. Reassured by EKG. Will monitor and return in 2 weeks.         Musculoskeletal and Integument   DDD (degenerative disc disease), lumbar    Chronic. Worsening, impacting QOL. Referral back to pain management and neurosurgery. Discussed PT. She may consider at next visit.       Relevant Orders   Ambulatory referral to Pain Clinic   Ambulatory referral to Neurosurgery       Other   Chest pain    New. Intermittent. Appears a/w anxiety, reassured by EKG. However Family h./o heart disease and no exercise at this time. Referral placed to cardiology for further testing      Relevant Orders   Ambulatory referral to Cardiology   EKG 12-Lead (Completed)    Other Visit Diagnoses    Screening for breast cancer    -  Primary   Relevant Orders   MM DIGITAL SCREENING BILATERAL       I have discontinued Ms. Wahid's senna-docusate, naproxen sodium, meloxicam, traZODone, PARoxetine, and gabapentin. I am also having her maintain her dicyclomine, HYDROcodone-acetaminophen, omeprazole, and amLODipine. We will continue to administer fentaNYL, lactated ringers, orphenadrine, midazolam, triamcinolone acetonide, bupivacaine (PF), fentaNYL, lactated ringers, midazolam, orphenadrine, triamcinolone acetonide, ceFAZolin, ceFAZolin, bupivacaine (PF), fentaNYL, lactated ringers, lidocaine (PF), midazolam, orphenadrine, triamcinolone acetonide, ceFAZolin, fentaNYL, lactated ringers, lidocaine (PF), orphenadrine, midazolam, triamcinolone acetonide, and sodium chloride flush.   No orders of the defined types were placed in this encounter.   Return precautions given.   Risks, benefits, and alternatives of the medications and treatment plan prescribed today were discussed, and patient expressed understanding.   Education regarding symptom management and diagnosis given to patient on AVS.  Continue to follow with Mable Paris, FNP for routine health maintenance.   Misty Stanley Helt  and I agreed with plan.   Mable Paris, FNP

## 2016-07-07 ENCOUNTER — Other Ambulatory Visit (HOSPITAL_COMMUNITY)
Admission: RE | Admit: 2016-07-07 | Discharge: 2016-07-07 | Disposition: A | Discharge status: Home / Self Care | Payer: BLUE CROSS/BLUE SHIELD | Source: Ambulatory Visit | Attending: Family | Admitting: Family

## 2016-07-07 ENCOUNTER — Encounter: Payer: Self-pay | Admitting: Family

## 2016-07-07 ENCOUNTER — Ambulatory Visit (INDEPENDENT_AMBULATORY_CARE_PROVIDER_SITE_OTHER): Payer: BLUE CROSS/BLUE SHIELD | Admitting: Family

## 2016-07-07 VITALS — BP 146/88 | HR 86 | Temp 98.3°F | Ht 60.0 in | Wt 161.8 lb

## 2016-07-07 DIAGNOSIS — M5136 Other intervertebral disc degeneration, lumbar region: Secondary | ICD-10-CM

## 2016-07-07 DIAGNOSIS — Z0000 Encounter for general adult medical examination without abnormal findings: Secondary | ICD-10-CM

## 2016-07-07 DIAGNOSIS — I10 Essential (primary) hypertension: Secondary | ICD-10-CM

## 2016-07-07 LAB — COMPREHENSIVE METABOLIC PANEL
ALT: 19 U/L (ref 0–35)
AST: 19 U/L (ref 0–37)
Albumin: 4.5 g/dL (ref 3.5–5.2)
Alkaline Phosphatase: 115 U/L (ref 39–117)
BUN: 28 mg/dL — ABNORMAL HIGH (ref 6–23)
CO2: 29 meq/L (ref 19–32)
Calcium: 9.6 mg/dL (ref 8.4–10.5)
Chloride: 105 mEq/L (ref 96–112)
Creatinine, Ser: 1.24 mg/dL — ABNORMAL HIGH (ref 0.40–1.20)
GFR: 46.22 mL/min — AB (ref >60.00)
GLUCOSE: 95 mg/dL (ref 70–99)
POTASSIUM: 4.8 meq/L (ref 3.5–5.1)
Sodium: 141 mEq/L (ref 135–145)
Total Bilirubin: 0.7 mg/dL (ref 0.2–1.2)
Total Protein: 7.1 g/dL (ref 6.0–8.3)

## 2016-07-07 LAB — CBC WITH DIFFERENTIAL/PLATELET
Basophils Absolute: 0.1 10*3/uL (ref 0.0–0.1)
Basophils Relative: 1.1 % (ref 0.0–3.0)
EOS PCT: 2.8 % (ref 0.0–5.0)
Eosinophils Absolute: 0.2 10*3/uL (ref 0.0–0.7)
HCT: 40.5 % (ref 36.0–46.0)
Hemoglobin: 13.3 g/dL (ref 12.0–15.0)
LYMPHS ABS: 2.3 10*3/uL (ref 0.7–4.0)
Lymphocytes Relative: 33.2 % (ref 12.0–46.0)
MCHC: 32.7 g/dL (ref 30.0–36.0)
MCV: 91.9 fl (ref 78.0–100.0)
MONOS PCT: 7.9 % (ref 3.0–12.0)
Monocytes Absolute: 0.5 10*3/uL (ref 0.1–1.0)
NEUTROS ABS: 3.8 10*3/uL (ref 1.4–7.7)
NEUTROS PCT: 55 % (ref 43.0–77.0)
PLATELETS: 328 10*3/uL (ref 150.0–400.0)
RBC: 4.4 Mil/uL (ref 3.87–5.11)
RDW: 13.9 % (ref 11.5–15.5)
WBC: 6.8 10*3/uL (ref 4.0–10.5)

## 2016-07-07 LAB — LIPID PANEL
CHOL/HDL RATIO: 4
Cholesterol: 231 mg/dL — ABNORMAL HIGH (ref 0–200)
HDL: 65.6 mg/dL (ref >39.00)
LDL CALC: 145 mg/dL — AB (ref 0–99)
NonHDL: 165.54
TRIGLYCERIDES: 105 mg/dL (ref 0.0–149.0)
VLDL: 21 mg/dL (ref 0.0–40.0)

## 2016-07-07 LAB — VITAMIN D 25 HYDROXY (VIT D DEFICIENCY, FRACTURES): VITD: 16.77 ng/mL — ABNORMAL LOW (ref 30.00–100.00)

## 2016-07-07 LAB — HEMOGLOBIN A1C: Hgb A1c MFr Bld: 5.8 % (ref 4.6–6.5)

## 2016-07-07 LAB — TSH: TSH: 10.81 u[IU]/mL — ABNORMAL HIGH (ref 0.35–4.50)

## 2016-07-07 MED ORDER — DULOXETINE HCL 30 MG PO CPEP: ORAL_CAPSULE | ORAL | 3 refills | Status: DC

## 2016-07-07 MED ORDER — LISINOPRIL 5 MG PO TABS: 5.0000 mg | ORAL_TABLET | Freq: Every day | ORAL | 3 refills | Status: DC

## 2016-07-07 NOTE — Progress Notes (Signed)
Pre visit review using our clinic review tool, if applicable. No additional management support is needed unless otherwise documented below in the visit note. 

## 2016-07-07 NOTE — Assessment & Plan Note (Signed)
Screening labs today including HIV and hepatitis C which patient consented for. Advised tdap at local pharmacy. Pap and clinical breast exam performed today. Up-to-date on colonoscopy. Ordered mammogram and patient understands schedule.

## 2016-07-07 NOTE — Progress Notes (Signed)
Subjective:    Patient ID: Hannah [name], female    DOB: January 03, 1951, 66 y.o.   MRN: [id number]  CC: [name] is a 66 y.o. female who presents today for physical exam.    HPI: GERD- on PPI.   HTN- compliant. Denies exertional chest pain or pressure, numbness or tingling radiating to left arm or jaw, palpitations, dizziness, frequent headaches, changes in vision, or shortness of breath.   Chronic low back pain- unchanged; will call back referrals for neurosurgery. discussed cymbalta at last visit; would like to try    Colorectal Cancer Screening: UTD last year, polyps; ;repeat in 3 years. Breast Cancer Screening: Mammogram due Cervical Cancer Screening: h/o hysterectomy Bone Health screening/DEXA for 65+: No increased fracture risk. Defer screening at this time. Lung Cancer Screening: Doesn't have 30 year pack year history and age > 41 years       Tetanus - due       Hepatitis C screening - Candidate for HIV Screening- Candidate for  Labs: Screening labs today. Exercise: Gets regular exercise.  Alcohol use: none Smoking/tobacco use: Nonsmoker.  Regular dental exams: In need of dental exam. Wears seat belt: Yes.  HISTORY:  Past Medical History:  Diagnosis Date  . Carotid artery occlusion   . Degenerative joint disease of spine   . Depression    Followed in past by Dr. Randel Books  . GERD (gastroesophageal reflux disease)   . Heart murmur   . Hypercholesteremia   . Hyperlipidemia   . Hypertension   . Thyroid disease     Past Surgical History:  Procedure Laterality Date  . ABDOMINAL HYSTERECTOMY  1985   'has part of cervix left'.   . ADENOIDECTOMY    . APPENDECTOMY    . broken wrist  Left   . CHOLECYSTECTOMY    . FRACTURE SURGERY Left   . THYROIDECTOMY Right 02/28/2015   Procedure: THYROIDECTOMY;  Surgeon: Carloyn Manner, MD;  Location: [facility] ORS;  Service: ENT;  Laterality: Right;  . TONSILLECTOMY    . VAGINAL DELIVERY     Family History  Problem  Relation Age of Onset  . Hypertension Mother   . Hyperlipidemia Mother   . Heart disease Mother     MI  . Anxiety disorder Mother   . Drug abuse Mother   . Cancer Sister     breast  . Social phobia Sister   . Alcohol abuse Father   . Anxiety disorder Father   . Drug abuse Father   . Drug abuse Sister   . Anxiety disorder Sister   . Alcohol abuse Sister   . Breast cancer Sister   . Bipolar disorder Sister   . Colon cancer Neg Hx       ALLERGIES: No known allergies  Current Outpatient Prescriptions on File Prior to Visit  Medication Sig Dispense Refill  . amLODipine (NORVASC) 5 MG tablet TAKE 1 TABLET(5 MG) BY MOUTH DAILY 90 tablet 1  . dicyclomine (BENTYL) 10 MG capsule TK 1 C PO QID B MEALS AND ATN  0  . omeprazole (PRILOSEC) 40 MG capsule TAKE 1 CAPSULE BY MOUTH EVERY DAY 30 capsule 6   Current Facility-Administered Medications on File Prior to Visit  Medication Dose Route Frequency Provider Last Rate Last Dose  . bupivacaine (PF) (MARCAINE) 0.25 % injection 30 mL  30 mL Other Once Mohammed Kindle, MD      . bupivacaine (PF) (MARCAINE) 0.25 % injection 30 mL  30 mL Other Once  Mohammed Kindle, MD      . ceFAZolin (ANCEF) IVPB 1 g/50 mL premix  1 g Intravenous Once Mohammed Kindle, MD      . ceFAZolin (ANCEF) IVPB 1 g/50 mL premix  1 g Intravenous Once Mohammed Kindle, MD      . ceFAZolin (ANCEF) IVPB 1 g/50 mL premix  1 g Intravenous Once Mohammed Kindle, MD      . fentaNYL (SUBLIMAZE) injection 100 mcg  100 mcg Intravenous Once Mohammed Kindle, MD      . fentaNYL (SUBLIMAZE) injection 100 mcg  100 mcg Intravenous Once Mohammed Kindle, MD      . fentaNYL (SUBLIMAZE) injection 100 mcg  100 mcg Intravenous Once Mohammed Kindle, MD      . fentaNYL (SUBLIMAZE) injection 100 mcg  100 mcg Intravenous Once Mohammed Kindle, MD      . lactated ringers infusion 1,000 mL  1,000 mL Intravenous Continuous Mohammed Kindle, MD      . lactated ringers infusion 1,000 mL  1,000 mL Intravenous Continuous Mohammed Kindle, MD      . lactated ringers infusion 1,000 mL  1,000 mL Intravenous Continuous Mohammed Kindle, MD      . lactated ringers infusion 1,000 mL  1,000 mL Intravenous Continuous Mohammed Kindle, MD      . lidocaine (PF) (XYLOCAINE) 1 % injection 10 mL  10 mL Subcutaneous Once Mohammed Kindle, MD      . lidocaine (PF) (XYLOCAINE) 1 % injection 10 mL  10 mL Subcutaneous Once Mohammed Kindle, MD      . midazolam (VERSED) 5 MG/5ML injection 5 mg  5 mg Intravenous Once Mohammed Kindle, MD      . midazolam (VERSED) 5 MG/5ML injection 5 mg  5 mg Intravenous Once Mohammed Kindle, MD      . midazolam (VERSED) 5 MG/5ML injection 5 mg  5 mg Intravenous Once Mohammed Kindle, MD      . midazolam (VERSED) 5 MG/5ML injection 5 mg  5 mg Intravenous Once Mohammed Kindle, MD      . orphenadrine (NORFLEX) injection 60 mg  60 mg Intramuscular Once Mohammed Kindle, MD      . orphenadrine (NORFLEX) injection 60 mg  60 mg Intramuscular Once Mohammed Kindle, MD      . orphenadrine (NORFLEX) injection 60 mg  60 mg Intramuscular Once Mohammed Kindle, MD      . orphenadrine (NORFLEX) injection 60 mg  60 mg Intramuscular Once Mohammed Kindle, MD      . sodium chloride flush (NS) 0.9 % injection 20 mL  20 mL Other Once Mohammed Kindle, MD      . triamcinolone acetonide (KENALOG-40) injection 40 mg  40 mg Other Once Mohammed Kindle, MD      . triamcinolone acetonide (KENALOG-40) injection 40 mg  40 mg Other Once Mohammed Kindle, MD      . triamcinolone acetonide (KENALOG-40) injection 40 mg  40 mg Other Once Mohammed Kindle, MD      . triamcinolone acetonide (KENALOG-40) injection 40 mg  40 mg Other Once Mohammed Kindle, MD        Social History  Substance Use Topics  . Smoking status: Never Smoker  . Smokeless tobacco: Never Used  . Alcohol use No    Review of Systems  Constitutional: Negative for chills, fever and unexpected weight change.  HENT: Negative for congestion.   Respiratory: Negative for cough.   Cardiovascular: Negative for chest  pain, palpitations and leg swelling.  Gastrointestinal: Negative for nausea  and vomiting.  Musculoskeletal: Positive for back pain. Negative for arthralgias and myalgias.  Skin: Negative for rash.  Neurological: Negative for headaches.  Hematological: Negative for adenopathy.  Psychiatric/Behavioral: Negative for confusion.      Objective:    BP (!) 146/88   Pulse 86   Temp 98.3 F (36.8 C) (Oral)   Ht 5' (1.524 m)   Wt 161 lb 12.8 oz (73.4 kg)   LMP  (LMP Unknown)   SpO2 97%   BMI 31.60 kg/m   BP Readings from Last 3 Encounters:  07/07/16 (!) 146/88  06/29/16 (!) 126/98  11/12/15 106/66   Wt Readings from Last 3 Encounters:  07/07/16 161 lb 12.8 oz (73.4 kg)  06/29/16 160 lb 3.2 oz (72.7 kg)  11/12/15 150 lb (68 kg)    Physical Exam  Constitutional: She appears well-developed and well-nourished.  Eyes: Conjunctivae are normal.  Neck: No thyroid mass and no thyromegaly present.  Cardiovascular: Normal rate, regular rhythm, normal heart sounds and normal pulses.   Pulmonary/Chest: Effort normal and breath sounds normal. She has no wheezes. She has no rhonchi. She has no rales. Right breast exhibits no inverted nipple, no mass, no nipple discharge, no skin change and no tenderness. Left breast exhibits no inverted nipple, no mass, no nipple discharge, no skin change and no tenderness. Breasts are symmetrical.  No masses or asymmetry appreciated during CBE.  Genitourinary: Right adnexum displays no mass, no tenderness and no fullness. Left adnexum displays no mass, no tenderness and no fullness.  Genitourinary Comments: Pap performed. Unable to appreciated ovaries. No cervix.   Lymphadenopathy:       Head (right side): No submental, no submandibular, no tonsillar, no preauricular, no posterior auricular and no occipital adenopathy present.       Head (left side): No submental, no submandibular, no tonsillar, no preauricular, no posterior auricular and no occipital adenopathy  present.       Right cervical: No superficial cervical, no deep cervical and no posterior cervical adenopathy present.      Left cervical: No superficial cervical, no deep cervical and no posterior cervical adenopathy present.    She has no axillary adenopathy.       Right axillary: No pectoral and no lateral adenopathy present.       Left axillary: No pectoral and no lateral adenopathy present. Neurological: She is alert.  Skin: Skin is warm and dry.  Psychiatric: She has a normal mood and affect. Her speech is normal and behavior is normal. Thought content normal.  Vitals reviewed.      Assessment & Plan:   Problem List Items Addressed This Visit      Cardiovascular and Mediastinum   Essential hypertension, benign    Elevated today. Start lisinopril. Recheck bmp one week.       Relevant Medications   lisinopril (PRINIVIL,ZESTRIL) 5 MG tablet   Other Relevant Orders   Basic metabolic panel     Musculoskeletal and Integument   DDD (degenerative disc disease), lumbar    Awaiting for patient to call back pain clinic and neurosurgery to schedule. Trial cymbalta.       Relevant Medications   DULoxetine (CYMBALTA) 30 MG capsule     Other   Routine physical examination - Primary    Screening labs today including HIV and hepatitis C which patient consented for. Advised tdap at local pharmacy. Pap and clinical breast exam performed today. Up-to-date on colonoscopy. Ordered mammogram and patient understands schedule.  Relevant Orders   CBC with Differential/Platelet   Comprehensive metabolic panel   Hemoglobin A1c   Lipid panel   TSH   VITAMIN D 25 Hydroxy (Vit-D Deficiency, Fractures)   MM DIGITAL SCREENING BILATERAL   Hepatitis C antibody   HIV antibody   Cytology - PAP       I have discontinued Ms. Lacks's HYDROcodone-acetaminophen. I am also having her start on DULoxetine and lisinopril. Additionally, I am having her maintain her dicyclomine, omeprazole, and  amLODipine. We will continue to administer fentaNYL, lactated ringers, orphenadrine, midazolam, triamcinolone acetonide, bupivacaine (PF), fentaNYL, lactated ringers, midazolam, orphenadrine, triamcinolone acetonide, ceFAZolin, ceFAZolin, bupivacaine (PF), fentaNYL, lactated ringers, lidocaine (PF), midazolam, orphenadrine, triamcinolone acetonide, ceFAZolin, fentaNYL, lactated ringers, lidocaine (PF), orphenadrine, midazolam, triamcinolone acetonide, and sodium chloride flush.   Meds ordered this encounter  Medications  . DULoxetine (CYMBALTA) 30 MG capsule    Sig: Take one 30 mg tablet by mouth once a day for the first week. Then increase to two 30 mg tablets ( total 60mg ) by mouth once daily.    Dispense:  60 capsule    Refill:  3    Order Specific Question:   Supervising Provider    Answer:   Deborra Medina L [[id number]]  . lisinopril (PRINIVIL,ZESTRIL) 5 MG tablet    Sig: Take 1 tablet (5 mg total) by mouth daily.    Dispense:  90 tablet    Refill:  3    Order Specific Question:   Supervising Provider    Answer:   Crecencio Mc [[id number]]    Return precautions given.   Risks, benefits, and alternatives of the medications and treatment plan prescribed today were discussed, and patient expressed understanding.   Education regarding symptom management and diagnosis given to patient on AVS.   Continue to follow with Mable Paris, FNP for routine health maintenance.   Misty Stanley Plantz and I agreed with plan.   Mable Paris, FNP

## 2016-07-07 NOTE — Assessment & Plan Note (Signed)
Awaiting for patient to call back pain clinic and neurosurgery to schedule. Trial cymbalta.

## 2016-07-07 NOTE — Patient Instructions (Addendum)
Tdap vaccine at local pharmacy  Trial Cymbalta  Trial lisinopril.  We must do repeat you labs on Monday/Tuesday to ensure electrolytes normal.   Please monitor BP at home; goal < 140/90. Let me know if not at goal.   Follow up 6 weeks.     Labs today  We placed a referral. Mammogram this year. I asked that you call one the below locations and schedule this when it is convenient for you.   If you have dense breasts, you may ask for 3D mammogram over the traditional 2D mammogram as new evidence suggest 3D is superior. Please note that NOT all insurance companies cover 3D and you may have to pay a higher copay. You may call your insurance company to further clarify your benefits.   Options for [place], [place]  * [facility] [place] * Note if you ask for 3D mammogram at this location, you must request [place], [place] location*    Health Maintenance, Female Adopting a healthy lifestyle and getting preventive care can go a long way to promote health and wellness. Talk with your health care provider about what schedule of regular examinations is right for you. This is a good chance for you to check in with your provider about disease prevention and staying healthy. In between checkups, there are plenty of things you can do on your own. Experts have done a lot of research about which lifestyle changes and preventive measures are most likely to keep you healthy. Ask your health care provider for more information. Weight and diet Eat a healthy diet  Be sure to include plenty of vegetables, fruits, low-fat dairy products, and lean protein.  Do not eat a lot of foods high in solid fats, added sugars, or salt.  Get regular exercise. This is one of the most important things you can do for your health.  Most adults  should exercise for at least 150 minutes each week. The exercise should increase your heart rate and make you sweat (moderate-intensity exercise).  Most adults should also do strengthening exercises at least twice a week. This is in addition to the moderate-intensity exercise. Maintain a healthy weight  Body mass index (BMI) is a measurement that can be used to identify possible weight problems. It estimates body fat based on height and weight. Your health care provider can help determine your BMI and help you achieve or maintain a healthy weight.  For females 14 years of age and older:  A BMI below 18.5 is considered underweight.  A BMI of 18.5 to 24.9 is normal.  A BMI of 25 to 29.9 is considered overweight.  A BMI of 30 and above is considered obese. Watch levels of cholesterol and blood lipids  You should start having your blood tested for lipids and cholesterol at 65 years of age, then have this test every 5 years.  You may need to have your cholesterol levels checked more often if:  Your lipid or cholesterol levels are high.  You are older than 65 years of age.  You are at high risk for heart disease. Cancer screening Lung Cancer  Lung cancer screening is recommended for adults 58-69 years old who are at high risk for lung cancer because of a history of smoking.  A yearly low-dose CT scan of the lungs is  recommended for people who:  Currently smoke.  Have quit within the past 15 years.  Have at least a 30-pack-year history of smoking. A pack year is smoking an average of one pack of cigarettes a day for 1 year.  Yearly screening should continue until it has been 15 years since you quit.  Yearly screening should stop if you develop a health problem that would prevent you from having lung cancer treatment. Breast Cancer  Practice breast self-awareness. This means understanding how your breasts normally appear and feel.  It also means doing regular breast self-exams.  Let your health care provider know about any changes, no matter how small.  If you are in your 20s or 30s, you should have a clinical breast exam (CBE) by a health care provider every 1-3 years as part of a regular health exam.  If you are 43 or older, have a CBE every year. Also consider having a breast X-ray (mammogram) every year.  If you have a family history of breast cancer, talk to your health care provider about genetic screening.  If you are at high risk for breast cancer, talk to your health care provider about having an MRI and a mammogram every year.  Breast cancer gene (BRCA) assessment is recommended for women who have family members with BRCA-related cancers. BRCA-related cancers include:  Breast.  Ovarian.  Tubal.  Peritoneal cancers.  Results of the assessment will determine the need for genetic counseling and BRCA1 and BRCA2 testing. Cervical Cancer  Your health care provider may recommend that you be screened regularly for cancer of the pelvic organs (ovaries, uterus, and vagina). This screening involves a pelvic examination, including checking for microscopic changes to the surface of your cervix (Pap test). You may be encouraged to have this screening done every 3 years, beginning at age 56.  For women ages 44-65, health care providers may recommend pelvic exams and Pap testing every 3 years, or they may recommend the Pap and pelvic exam, combined with testing for human papilloma virus (HPV), every 5 years. Some types of HPV increase your risk of cervical cancer. Testing for HPV may also be done on women of any age with unclear Pap test results.  Other health care providers may not recommend any screening for nonpregnant women who are considered low risk for pelvic cancer and who do not have symptoms. Ask your health care provider if a screening pelvic exam is right for you.  If you have had past treatment for cervical cancer or a condition that could lead to cancer,  you need Pap tests and screening for cancer for at least 20 years after your treatment. If Pap tests have been discontinued, your risk factors (such as having a new sexual partner) need to be reassessed to determine if screening should resume. Some women have medical problems that increase the chance of getting cervical cancer. In these cases, your health care provider may recommend more frequent screening and Pap tests. Colorectal Cancer  This type of cancer can be detected and often prevented.  Routine colorectal cancer screening usually begins at 65 years of age and continues through 65 years of age.  Your health care provider may recommend screening at an earlier age if you have risk factors for colon cancer.  Your health care provider may also recommend using home test kits to check for hidden blood in the stool.  A small camera at the end of a tube can be used to examine your colon directly (  sigmoidoscopy or colonoscopy). This is done to check for the earliest forms of colorectal cancer.  Routine screening usually begins at age 43.  Direct examination of the colon should be repeated every 5-10 years through 65 years of age. However, you may need to be screened more often if early forms of precancerous polyps or small growths are found. Skin Cancer  Check your skin from head to toe regularly.  Tell your health care provider about any new moles or changes in moles, especially if there is a change in a mole's shape or color.  Also tell your health care provider if you have a mole that is larger than the size of a pencil eraser.  Always use sunscreen. Apply sunscreen liberally and repeatedly throughout the day.  Protect yourself by wearing long sleeves, pants, a wide-brimmed hat, and sunglasses whenever you are outside. Heart disease, diabetes, and high blood pressure  High blood pressure causes heart disease and increases the risk of stroke. High blood pressure is more likely to  develop in:  People who have blood pressure in the high end of the normal range (130-139/85-89 mm Hg).  People who are overweight or obese.  People who are African American.  If you are 25-52 years of age, have your blood pressure checked every 3-5 years. If you are 32 years of age or older, have your blood pressure checked every year. You should have your blood pressure measured twice-once when you are at a hospital or clinic, and once when you are not at a hospital or clinic. Record the average of the two measurements. To check your blood pressure when you are not at a hospital or clinic, you can use:  An automated blood pressure machine at a pharmacy.  A home blood pressure monitor.  If you are between 35 years and 5 years old, ask your health care provider if you should take aspirin to prevent strokes.  Have regular diabetes screenings. This involves taking a blood sample to check your fasting blood sugar level.  If you are at a normal weight and have a low risk for diabetes, have this test once every three years after 65 years of age.  If you are overweight and have a high risk for diabetes, consider being tested at a younger age or more often. Preventing infection Hepatitis B  If you have a higher risk for hepatitis B, you should be screened for this virus. You are considered at high risk for hepatitis B if:  You were born in a country where hepatitis B is common. Ask your health care provider which countries are considered high risk.  Your parents were born in a high-risk country, and you have not been immunized against hepatitis B (hepatitis B vaccine).  You have HIV or AIDS.  You use needles to inject street drugs.  You live with someone who has hepatitis B.  You have had sex with someone who has hepatitis B.  You get hemodialysis treatment.  You take certain medicines for conditions, including cancer, organ transplantation, and autoimmune conditions. Hepatitis  C  Blood testing is recommended for:  Everyone born from [birth year] through 1964.  Anyone with known risk factors for hepatitis C. Sexually transmitted infections (STIs)  You should be screened for sexually transmitted infections (STIs) including gonorrhea and chlamydia if:  You are sexually active and are younger than 65 years of age.  You are older than 65 years of age and your health care provider tells you that you  are at risk for this type of infection.  Your sexual activity has changed since you were last screened and you are at an increased risk for chlamydia or gonorrhea. Ask your health care provider if you are at risk.  If you do not have HIV, but are at risk, it may be recommended that you take a prescription medicine daily to prevent HIV infection. This is called pre-exposure prophylaxis (PrEP). You are considered at risk if:  You are sexually active and do not regularly use condoms or know the HIV status of your partner(s).  You take drugs by injection.  You are sexually active with a partner who has HIV. Talk with your health care provider about whether you are at high risk of being infected with HIV. If you choose to begin PrEP, you should first be tested for HIV. You should then be tested every 3 months for as long as you are taking PrEP. Pregnancy  If you are premenopausal and you may become pregnant, ask your health care provider about preconception counseling.  If you may become pregnant, take 400 to 800 micrograms (mcg) of folic acid every day.  If you want to prevent pregnancy, talk to your health care provider about birth control (contraception). Osteoporosis and menopause  Osteoporosis is a disease in which the bones lose minerals and strength with aging. This can result in serious bone fractures. Your risk for osteoporosis can be identified using a bone density scan.  If you are 83 years of age or older, or if you are at risk for osteoporosis and fractures, ask  your health care provider if you should be screened.  Ask your health care provider whether you should take a calcium or vitamin D supplement to lower your risk for osteoporosis.  Menopause may have certain physical symptoms and risks.  Hormone replacement therapy may reduce some of these symptoms and risks. Talk to your health care provider about whether hormone replacement therapy is right for you. Follow these instructions at home:  Schedule regular health, dental, and eye exams.  Stay current with your immunizations.  Do not use any tobacco products including cigarettes, chewing tobacco, or electronic cigarettes.  If you are pregnant, do not drink alcohol.  If you are breastfeeding, limit how much and how often you drink alcohol.  Limit alcohol intake to no more than 1 drink per day for nonpregnant women. One drink equals 12 ounces of beer, 5 ounces of wine, or 1 ounces of hard liquor.  Do not use street drugs.  Do not share needles.  Ask your health care provider for help if you need support or information about quitting drugs.  Tell your health care provider if you often feel depressed.  Tell your health care provider if you have ever been abused or do not feel safe at home. This information is not intended to replace advice given to you by your health care provider. Make sure you discuss any questions you have with your health care provider. Document Released: 09/22/2010 Document Revised: 08/15/2015 Document Reviewed: 12/11/2014 Elsevier Interactive Patient Education  2017 [employer].

## 2016-07-07 NOTE — Assessment & Plan Note (Signed)
Elevated today. Start lisinopril. Recheck bmp one week.

## 2016-07-08 ENCOUNTER — Other Ambulatory Visit: Payer: Self-pay | Admitting: Family

## 2016-07-08 DIAGNOSIS — E039 Hypothyroidism, unspecified: Secondary | ICD-10-CM

## 2016-07-08 DIAGNOSIS — E785 Hyperlipidemia, unspecified: Secondary | ICD-10-CM

## 2016-07-08 DIAGNOSIS — I10 Essential (primary) hypertension: Secondary | ICD-10-CM

## 2016-07-08 LAB — HEPATITIS C ANTIBODY: HCV AB: NEGATIVE

## 2016-07-08 LAB — HIV ANTIBODY (ROUTINE TESTING W REFLEX): HIV: NONREACTIVE

## 2016-07-08 LAB — CYTOLOGY - PAP
Diagnosis: NEGATIVE
HPV: NOT DETECTED

## 2016-07-08 MED ORDER — PRAVASTATIN SODIUM 40 MG PO TABS
40.0000 mg | ORAL_TABLET | Freq: Every day | ORAL | 2 refills | Status: DC
Start: 2016-07-08 — End: 2016-08-20

## 2016-07-08 MED ORDER — HYDROCHLOROTHIAZIDE 12.5 MG PO CAPS: 12.5000 mg | ORAL_CAPSULE | Freq: Every day | ORAL | 0 refills | Status: DC

## 2016-07-14 ENCOUNTER — Other Ambulatory Visit: Payer: Self-pay

## 2016-07-17 ENCOUNTER — Other Ambulatory Visit: Payer: Self-pay

## 2016-07-24 ENCOUNTER — Encounter: Payer: Self-pay | Admitting: Family

## 2016-08-11 ENCOUNTER — Ambulatory Visit: Payer: BLUE CROSS/BLUE SHIELD | Attending: Family

## 2016-08-18 ENCOUNTER — Ambulatory Visit: Payer: Self-pay | Admitting: Family

## 2016-08-18 ENCOUNTER — Telehealth: Payer: Self-pay | Admitting: Family

## 2016-08-18 NOTE — Telephone Encounter (Signed)
FYI, pt forgot about her appt this morning she thought it was just lab appt. Pt did resch appt for another day. Let me know if you want me to cancel appt. Thank you!

## 2016-08-18 NOTE — Telephone Encounter (Signed)
FYI

## 2016-08-19 NOTE — Telephone Encounter (Signed)
Good Morning Hannah [name]!  Per doctor stated it was ok not to charge pt. Note was sent to me after appt rolled over and now I cannot canel the appt. Please show me if there's a way to cancel now that it has went into the past appt. Thank you!

## 2016-08-19 NOTE — Telephone Encounter (Signed)
No charge. 

## 2016-08-20 ENCOUNTER — Telehealth: Payer: Self-pay | Admitting: Family

## 2016-08-20 ENCOUNTER — Ambulatory Visit (INDEPENDENT_AMBULATORY_CARE_PROVIDER_SITE_OTHER): Payer: BLUE CROSS/BLUE SHIELD | Admitting: Family

## 2016-08-20 ENCOUNTER — Encounter: Payer: Self-pay | Admitting: Family

## 2016-08-20 VITALS — BP 126/88 | HR 86 | Temp 98.7°F | Ht 60.0 in | Wt 160.0 lb

## 2016-08-20 DIAGNOSIS — E042 Nontoxic multinodular goiter: Secondary | ICD-10-CM | POA: Diagnosis not present

## 2016-08-20 DIAGNOSIS — E785 Hyperlipidemia, unspecified: Secondary | ICD-10-CM | POA: Diagnosis not present

## 2016-08-20 DIAGNOSIS — N189 Chronic kidney disease, unspecified: Secondary | ICD-10-CM | POA: Diagnosis not present

## 2016-08-20 DIAGNOSIS — I10 Essential (primary) hypertension: Secondary | ICD-10-CM | POA: Diagnosis not present

## 2016-08-20 LAB — COMPREHENSIVE METABOLIC PANEL
ALT: 23 U/L (ref 0–35)
AST: 24 U/L (ref 0–37)
Albumin: 4.5 g/dL (ref 3.5–5.2)
Alkaline Phosphatase: 113 U/L (ref 39–117)
BILIRUBIN TOTAL: 0.6 mg/dL (ref 0.2–1.2)
BUN: 23 mg/dL (ref 6–23)
CO2: 32 meq/L (ref 19–32)
CREATININE: 1.28 mg/dL — AB (ref 0.40–1.20)
Calcium: 9.5 mg/dL (ref 8.4–10.5)
Chloride: 105 mEq/L (ref 96–112)
GFR: 44.54 mL/min — AB (ref >60.00)
Glucose, Bld: 101 mg/dL — ABNORMAL HIGH (ref 70–99)
Potassium: 5.2 mEq/L — ABNORMAL HIGH (ref 3.5–5.1)
Sodium: 141 mEq/L (ref 135–145)
Total Protein: 7.1 g/dL (ref 6.0–8.3)

## 2016-08-20 LAB — T3, FREE: T3, Free: 3.1 pg/mL (ref 2.3–4.2)

## 2016-08-20 LAB — TSH: TSH: 7.97 u[IU]/mL — ABNORMAL HIGH (ref 0.35–4.50)

## 2016-08-20 LAB — T4, FREE: FREE T4: 0.72 ng/dL (ref 0.60–1.60)

## 2016-08-20 NOTE — Progress Notes (Signed)
Subjective:    Patient ID: Hannah [name], female    DOB: 01-24-1951, 66 y.o.   MRN: [id number]  CC: Hannah [name] is a 66 y.o. female who presents today for follow up.   HPI: Kidney function decreased- Off lisinopril.    H/o thyroidectomy- here to repeat. Thyroid labs  HTN- on norvasc; didn't start hctz . Denies exertional chest pain or pressure, numbness or tingling radiating to left arm or jaw, palpitations, dizziness, frequent headaches, changes in vision, or shortness of breath.    HLD - not on medication for cholesterol due to cost.    Declines mammogram.    HISTORY:  Past Medical History:  Diagnosis Date  . Carotid artery occlusion   . Degenerative joint disease of spine   . Depression    Followed in past by Dr. Randel Books  . GERD (gastroesophageal reflux disease)   . Heart murmur   . Hypercholesteremia   . Hyperlipidemia   . Hypertension   . Thyroid disease    Past Surgical History:  Procedure Laterality Date  . ABDOMINAL HYSTERECTOMY  1985   'has part of cervix left'.   . ADENOIDECTOMY    . APPENDECTOMY    . broken wrist  Left   . CHOLECYSTECTOMY    . FRACTURE SURGERY Left   . THYROIDECTOMY Right 02/28/2015   Procedure: THYROIDECTOMY;  Surgeon: Carloyn Manner, MD;  Location: [facility] ORS;  Service: ENT;  Laterality: Right;  . TONSILLECTOMY    . VAGINAL DELIVERY     Family History  Problem Relation Age of Onset  . Hypertension Mother   . Hyperlipidemia Mother   . Heart disease Mother        MI  . Anxiety disorder Mother   . Drug abuse Mother   . Cancer Sister        breast  . Social phobia Sister   . Alcohol abuse Father   . Anxiety disorder Father   . Drug abuse Father   . Drug abuse Sister   . Anxiety disorder Sister   . Alcohol abuse Sister   . Breast cancer Sister   . Bipolar disorder Sister   . Colon cancer Neg Hx     Allergies: No known allergies Current Outpatient Prescriptions on File Prior to Visit  Medication Sig Dispense  Refill  . amLODipine (NORVASC) 5 MG tablet TAKE 1 TABLET(5 MG) BY MOUTH DAILY 90 tablet 1  . omeprazole (PRILOSEC) 40 MG capsule TAKE 1 CAPSULE BY MOUTH EVERY DAY 30 capsule 6   Current Facility-Administered Medications on File Prior to Visit  Medication Dose Route Frequency Provider Last Rate Last Dose  . bupivacaine (PF) (MARCAINE) 0.25 % injection 30 mL  30 mL Other Once Mohammed Kindle, MD      . bupivacaine (PF) (MARCAINE) 0.25 % injection 30 mL  30 mL Other Once Mohammed Kindle, MD      . ceFAZolin (ANCEF) IVPB 1 g/50 mL premix  1 g Intravenous Once Mohammed Kindle, MD      . ceFAZolin (ANCEF) IVPB 1 g/50 mL premix  1 g Intravenous Once Mohammed Kindle, MD      . ceFAZolin (ANCEF) IVPB 1 g/50 mL premix  1 g Intravenous Once Mohammed Kindle, MD      . fentaNYL (SUBLIMAZE) injection 100 mcg  100 mcg Intravenous Once Mohammed Kindle, MD      . fentaNYL (SUBLIMAZE) injection 100 mcg  100 mcg Intravenous Once Mohammed Kindle, MD      .  fentaNYL (SUBLIMAZE) injection 100 mcg  100 mcg Intravenous Once Mohammed Kindle, MD      . fentaNYL (SUBLIMAZE) injection 100 mcg  100 mcg Intravenous Once Mohammed Kindle, MD      . lactated ringers infusion 1,000 mL  1,000 mL Intravenous Continuous Mohammed Kindle, MD      . lactated ringers infusion 1,000 mL  1,000 mL Intravenous Continuous Mohammed Kindle, MD      . lactated ringers infusion 1,000 mL  1,000 mL Intravenous Continuous Mohammed Kindle, MD      . lactated ringers infusion 1,000 mL  1,000 mL Intravenous Continuous Mohammed Kindle, MD      . lidocaine (PF) (XYLOCAINE) 1 % injection 10 mL  10 mL Subcutaneous Once Mohammed Kindle, MD      . lidocaine (PF) (XYLOCAINE) 1 % injection 10 mL  10 mL Subcutaneous Once Mohammed Kindle, MD      . midazolam (VERSED) 5 MG/5ML injection 5 mg  5 mg Intravenous Once Mohammed Kindle, MD      . midazolam (VERSED) 5 MG/5ML injection 5 mg  5 mg Intravenous Once Mohammed Kindle, MD      . midazolam (VERSED) 5 MG/5ML injection 5 mg   5 mg Intravenous Once Mohammed Kindle, MD      . midazolam (VERSED) 5 MG/5ML injection 5 mg  5 mg Intravenous Once Mohammed Kindle, MD      . orphenadrine (NORFLEX) injection 60 mg  60 mg Intramuscular Once Mohammed Kindle, MD      . orphenadrine (NORFLEX) injection 60 mg  60 mg Intramuscular Once Mohammed Kindle, MD      . orphenadrine (NORFLEX) injection 60 mg  60 mg Intramuscular Once Mohammed Kindle, MD      . orphenadrine (NORFLEX) injection 60 mg  60 mg Intramuscular Once Mohammed Kindle, MD      . sodium chloride flush (NS) 0.9 % injection 20 mL  20 mL Other Once Mohammed Kindle, MD      . triamcinolone acetonide (KENALOG-40) injection 40 mg  40 mg Other Once Mohammed Kindle, MD      . triamcinolone acetonide (KENALOG-40) injection 40 mg  40 mg Other Once Mohammed Kindle, MD      . triamcinolone acetonide (KENALOG-40) injection 40 mg  40 mg Other Once Mohammed Kindle, MD      . triamcinolone acetonide (KENALOG-40) injection 40 mg  40 mg Other Once Mohammed Kindle, MD        Social History  Substance Use Topics  . Smoking status: Never Smoker  . Smokeless tobacco: Never Used  . Alcohol use No    Review of Systems  Constitutional: Negative for chills and fever.  Eyes: Negative for visual disturbance.  Respiratory: Negative for cough.   Cardiovascular: Negative for chest pain and palpitations.  Gastrointestinal: Negative for nausea and vomiting.  Neurological: Negative for headaches.      Objective:    BP 126/88   Pulse 86   Temp 98.7 F (37.1 C) (Oral)   Ht 5' (1.524 m)   Wt 160 lb (72.6 kg)   LMP  (LMP Unknown)   SpO2 96%   BMI 31.25 kg/m  BP Readings from Last 3 Encounters:  08/20/16 126/88  07/07/16 (!) 146/88  06/29/16 (!) 126/98   Wt Readings from Last 3 Encounters:  08/20/16 160 lb (72.6 kg)  07/07/16 161 lb 12.8 oz (73.4 kg)  06/29/16 160 lb 3.2 oz (72.7 kg)    Physical Exam     Assessment &  Plan:   Problem List Items Addressed This Visit       Cardiovascular and Mediastinum   Essential hypertension, benign - Primary    At goal. Will continue current regimen.        Endocrine   Multiple thyroid nodules    Clinically asymptomatic. Repeating thyroid studies today.  patient reports thyroidectomy years ago due to difficulty swallowing.        Genitourinary   CKD (chronic kidney disease)    Discussed controlling blood pressure as well as avoiding nephrotoxic medicaiton, including NSAIDs, to preserve function. will continue to monitor.        Other   HLD (hyperlipidemia)    Currently not on Pravachol due to cost. Patient will let me know based on her insurance was a preferred cholesterol medication.          I have discontinued Ms. Elling's dicyclomine, DULoxetine, pravastatin, and hydrochlorothiazide. I am also having her maintain her omeprazole and amLODipine. We will continue to administer fentaNYL, lactated ringers, orphenadrine, midazolam, triamcinolone acetonide, bupivacaine (PF), fentaNYL, lactated ringers, midazolam, orphenadrine, triamcinolone acetonide, ceFAZolin, ceFAZolin, bupivacaine (PF), fentaNYL, lactated ringers, lidocaine (PF), midazolam, orphenadrine, triamcinolone acetonide, ceFAZolin, fentaNYL, lactated ringers, lidocaine (PF), orphenadrine, midazolam, triamcinolone acetonide, and sodium chloride flush.   No orders of the defined types were placed in this encounter.   Return precautions given.   Risks, benefits, and alternatives of the medications and treatment plan prescribed today were discussed, and patient expressed understanding.   Education regarding symptom management and diagnosis given to patient on AVS.  Continue to follow with Burnard Hawthorne, FNP for routine health maintenance.   Misty Stanley Rezendes and I agreed with plan.   Mable Paris, FNP

## 2016-08-20 NOTE — Patient Instructions (Signed)
Labs today  Call me and let me know which cholesterol medication is on your drug list  Lab work shows diminished kidney function which appears stable from you baseline 3 months ago. Most important to avoid all over the counter antiinflammatories ( such as Aleve, Motrin, ibuprofen etc) as they can worsen kidney function. Our goal is to preserve your kidney function going forward. If we see any more of a decrease, I will place a referral to nephrology.    Follow up 3-6 months.

## 2016-08-20 NOTE — Assessment & Plan Note (Signed)
Clinically asymptomatic. Repeating thyroid studies today.  patient reports thyroidectomy years ago due to difficulty swallowing.

## 2016-08-20 NOTE — Progress Notes (Signed)
Pre visit review using our clinic review tool, if applicable. No additional management support is needed unless otherwise documented below in the visit note. 

## 2016-08-20 NOTE — Assessment & Plan Note (Signed)
At goal. Will continue current regimen.  

## 2016-08-20 NOTE — Telephone Encounter (Signed)
Pt was calling to talk with Anette about a medication the talked about at patient's appointment today. Please call her at [phone].

## 2016-08-20 NOTE — Assessment & Plan Note (Signed)
Discussed controlling blood pressure as well as avoiding nephrotoxic medicaiton, including NSAIDs, to preserve function. will continue to monitor.

## 2016-08-20 NOTE — Assessment & Plan Note (Signed)
Currently not on Pravachol due to cost. Patient will let me know based on her insurance was a preferred cholesterol medication.

## 2016-08-20 NOTE — Telephone Encounter (Signed)
Left message for patient to return call back.  

## 2016-08-21 NOTE — Telephone Encounter (Signed)
Left message for patient to return call back.  

## 2016-08-25 NOTE — Telephone Encounter (Signed)
Left message for patient to return call back. Closing note due to patient not returning call back.

## 2016-08-26 ENCOUNTER — Other Ambulatory Visit: Payer: Self-pay | Admitting: Family

## 2016-08-26 DIAGNOSIS — E038 Other specified hypothyroidism: Secondary | ICD-10-CM

## 2016-08-26 DIAGNOSIS — E039 Hypothyroidism, unspecified: Secondary | ICD-10-CM

## 2016-09-01 ENCOUNTER — Ambulatory Visit (HOSPITAL_BASED_OUTPATIENT_CLINIC_OR_DEPARTMENT_OTHER): Payer: Self-pay | Admitting: Nurse Practitioner

## 2016-09-01 ENCOUNTER — Encounter: Payer: Self-pay | Admitting: Nurse Practitioner

## 2016-09-01 ENCOUNTER — Ambulatory Visit
Admission: RE | Admit: 2016-09-01 | Discharge: 2016-09-01 | Disposition: A | Discharge status: Home / Self Care | Payer: Self-pay | Source: Ambulatory Visit | Attending: Nurse Practitioner | Admitting: Nurse Practitioner

## 2016-09-01 ENCOUNTER — Other Ambulatory Visit: Payer: Self-pay | Admitting: Nurse Practitioner

## 2016-09-01 VITALS — BP 131/52 | HR 80 | Temp 98.4°F | Resp 18 | Ht 60.0 in | Wt 160.0 lb

## 2016-09-01 DIAGNOSIS — Z811 Family history of alcohol abuse and dependence: Secondary | ICD-10-CM | POA: Insufficient documentation

## 2016-09-01 DIAGNOSIS — K219 Gastro-esophageal reflux disease without esophagitis: Secondary | ICD-10-CM | POA: Insufficient documentation

## 2016-09-01 DIAGNOSIS — M25569 Pain in unspecified knee: Secondary | ICD-10-CM | POA: Insufficient documentation

## 2016-09-01 DIAGNOSIS — M170 Bilateral primary osteoarthritis of knee: Secondary | ICD-10-CM | POA: Insufficient documentation

## 2016-09-01 DIAGNOSIS — M545 Low back pain: Secondary | ICD-10-CM

## 2016-09-01 DIAGNOSIS — M25559 Pain in unspecified hip: Secondary | ICD-10-CM | POA: Insufficient documentation

## 2016-09-01 DIAGNOSIS — M160 Bilateral primary osteoarthritis of hip: Secondary | ICD-10-CM | POA: Insufficient documentation

## 2016-09-01 DIAGNOSIS — M25552 Pain in left hip: Secondary | ICD-10-CM

## 2016-09-01 DIAGNOSIS — I6529 Occlusion and stenosis of unspecified carotid artery: Secondary | ICD-10-CM | POA: Insufficient documentation

## 2016-09-01 DIAGNOSIS — F1190 Opioid use, unspecified, uncomplicated: Secondary | ICD-10-CM

## 2016-09-01 DIAGNOSIS — E079 Disorder of thyroid, unspecified: Secondary | ICD-10-CM | POA: Insufficient documentation

## 2016-09-01 DIAGNOSIS — M5136 Other intervertebral disc degeneration, lumbar region: Secondary | ICD-10-CM | POA: Insufficient documentation

## 2016-09-01 DIAGNOSIS — E890 Postprocedural hypothyroidism: Secondary | ICD-10-CM | POA: Insufficient documentation

## 2016-09-01 DIAGNOSIS — Z813 Family history of other psychoactive substance abuse and dependence: Secondary | ICD-10-CM | POA: Insufficient documentation

## 2016-09-01 DIAGNOSIS — Z9049 Acquired absence of other specified parts of digestive tract: Secondary | ICD-10-CM | POA: Insufficient documentation

## 2016-09-01 DIAGNOSIS — Z8249 Family history of ischemic heart disease and other diseases of the circulatory system: Secondary | ICD-10-CM | POA: Insufficient documentation

## 2016-09-01 DIAGNOSIS — M25562 Pain in left knee: Secondary | ICD-10-CM | POA: Insufficient documentation

## 2016-09-01 DIAGNOSIS — M25551 Pain in right hip: Secondary | ICD-10-CM

## 2016-09-01 DIAGNOSIS — Z79891 Long term (current) use of opiate analgesic: Secondary | ICD-10-CM | POA: Insufficient documentation

## 2016-09-01 DIAGNOSIS — G894 Chronic pain syndrome: Secondary | ICD-10-CM

## 2016-09-01 DIAGNOSIS — Z9889 Other specified postprocedural states: Secondary | ICD-10-CM | POA: Insufficient documentation

## 2016-09-01 DIAGNOSIS — Z803 Family history of malignant neoplasm of breast: Secondary | ICD-10-CM | POA: Insufficient documentation

## 2016-09-01 DIAGNOSIS — Z90711 Acquired absence of uterus with remaining cervical stump: Secondary | ICD-10-CM | POA: Insufficient documentation

## 2016-09-01 DIAGNOSIS — I129 Hypertensive chronic kidney disease with stage 1 through stage 4 chronic kidney disease, or unspecified chronic kidney disease: Secondary | ICD-10-CM | POA: Insufficient documentation

## 2016-09-01 DIAGNOSIS — M4316 Spondylolisthesis, lumbar region: Secondary | ICD-10-CM | POA: Insufficient documentation

## 2016-09-01 DIAGNOSIS — Z818 Family history of other mental and behavioral disorders: Secondary | ICD-10-CM | POA: Insufficient documentation

## 2016-09-01 DIAGNOSIS — M25561 Pain in right knee: Secondary | ICD-10-CM | POA: Insufficient documentation

## 2016-09-01 DIAGNOSIS — R911 Solitary pulmonary nodule: Secondary | ICD-10-CM | POA: Insufficient documentation

## 2016-09-01 DIAGNOSIS — G8929 Other chronic pain: Secondary | ICD-10-CM

## 2016-09-01 DIAGNOSIS — M51369 Other intervertebral disc degeneration, lumbar region without mention of lumbar back pain or lower extremity pain: Secondary | ICD-10-CM

## 2016-09-01 DIAGNOSIS — N189 Chronic kidney disease, unspecified: Secondary | ICD-10-CM | POA: Insufficient documentation

## 2016-09-01 DIAGNOSIS — E7800 Pure hypercholesterolemia, unspecified: Secondary | ICD-10-CM | POA: Insufficient documentation

## 2016-09-01 NOTE — Patient Instructions (Signed)
°____________________________________________________________________________________________ ° °Appointment Policy ° °It is our goal and responsibility to provide the medical community with assistance in the evaluation and management of patients with chronic pain. Unfortunately our resources are limited. Because we do not have an unlimited amount of time, or available appointments, we are required to closely monitor and manage their use. The following rules exist to maximize their use: ° °Patient's responsibilities: °1. Punctuality: You are required to be physically present and registered in our facility at least 30 minutes before your appointment. °2. Tardiness: The cutoff is your appointment time. If you have an appointment scheduled for 10:00 AM and you arrive at 10:01, you will be required to reschedule your appointment.  °3. Plan ahead: Always assume that you will encounter traffic on your way in. Plan for it. If you are dependent on a driver, make sure they understand these rules and the need to arrive early. °4. Other appointments and responsibilities: Avoid scheduling any other appointments before or after your pain clinic appointments.  °5. Be prepared: Write down everything that you need to discuss with your healthcare provider and give this information to the admitting nurse. Write down the medications that you will need refilled. Bring your pills and bottles (even the empty ones), to all of your appointments, except for those where a procedure is scheduled. °6. No children or pets: Find someone to take care of them. It is not appropriate to bring them in. °7. Scheduling changes: We request "advanced notification" of any changes or cancellations. °8. Advanced notification: Defined as a time period of more than 24 hours prior to the originally scheduled appointment. This allows for the appointment to be offered to other patients. °9. Rescheduling: When a visit is rescheduled, it will require the  cancellation of the original appointment. For this reason they both fall within the category of "Cancellations".  °10. Cancellations: They require advanced notification. Any cancellation less than 24 hours before the  appointment will be recorded as a "No Show". °11. No Show: Defined as an unkept appointment where the patient failed to notify or declare to the practice their intention or inability to keep the appointment. ° °Corrective process for repeat offenders:  °1. Tardiness: Three (3) episodes of rescheduling due to late arrivals will be recorded as one (1) "No Show". °2. Cancellation or reschedule: Three (3) cancellations or rescheduling will be recorded as one (1) "No Show". °3. "No Shows": Three (3) "No Shows" within a 12 month period will result in discharge from the practice. ° °____________________________________________________________________________________________ ° °____________________________________________________________________________________________ ° °Pain Scale ° °Introduction: The pain score used by this practice is the Verbal Numerical Rating Scale (VNRS-11). This is an 11-point scale. It is for adults and children 10 years or older. There are significant differences in how the pain score is reported, used, and applied. Forget everything you learned in the past and learn this scoring system. ° °General Information: The scale should reflect your current level of pain. Unless you are specifically asked for the level of your worst pain, or your average pain. If you are asked for one of these two, then it should be understood that it is over the past 24 hours. ° °Basic Activities of Daily Living (ADL): Personal hygiene, dressing, eating, transferring, and using restroom. ° °Instructions: Most patients tend to report their level of pain as a combination of two factors, their physical pain and their psychosocial pain. This last one is also known as “suffering” and it is reflection of how  physical pain   affects you socially and psychologically. From now on, report them separately. From this point on, when asked to report your pain level, report only your physical pain. Use the following table for reference. ° °Pain Clinic Pain Levels (0-5/10)  °Pain Level Score  Description  °No Pain 0   °Mild pain 1 Nagging, annoying, but does not interfere with basic activities of daily living (ADL). Patients are able to eat, bathe, get dressed, toileting (being able to get on and off the toilet and perform personal hygiene functions), transfer (move in and out of bed or a chair without assistance), and maintain continence (able to control bladder and bowel functions). Blood pressure and heart rate are unaffected. A normal heart rate for a healthy adult ranges from 60 to 100 bpm (beats per minute). °  °Mild to moderate pain 2 Noticeable and distracting. Impossible to hide from other people. More frequent flare-ups. Still possible to adapt and function close to normal. It can be very annoying and may have occasional stronger flare-ups. With discipline, patients may get used to it and adapt. °  °Moderate pain 3 Interferes significantly with activities of daily living (ADL). It becomes difficult to feed, bathe, get dressed, get on and off the toilet or to perform personal hygiene functions. Difficult to get in and out of bed or a chair without assistance. Very distracting. With effort, it can be ignored when deeply involved in activities. °  °Moderately severe pain 4 Impossible to ignore for more than a few minutes. With effort, patients may still be able to manage work or participate in some social activities. Very difficult to concentrate. °Signs of autonomic nervous system discharge are evident: dilated pupils (mydriasis); mild sweating (diaphoresis); sleep interference. Heart rate becomes elevated (>115 bpm). Diastolic blood pressure (lower number) rises above 100 mmHg. Patients find relief in laying down and not  moving. °  °Severe pain 5 Intense and extremely unpleasant. Associated with frowning face and frequent crying. Pain overwhelms the senses.  Ability to do any activity or maintain social relationships becomes significantly limited. Conversation becomes difficult. Pacing back and forth is common, as getting into a comfortable position is nearly impossible. Pain wakes you up from deep sleep. Physical signs will be obvious: pupillary dilation; increased sweating; goosebumps; brisk reflexes; cold, clammy hands and feet; nausea, vomiting or dry heaves; loss of appetite; significant sleep disturbance with inability to fall asleep or to remain asleep. When persistent, significant weight loss is observed due to the complete loss of appetite and sleep deprivation.  Blood pressure and heart rate becomes significantly elevated. °Caution: If elevated blood pressure triggers a pounding headache associated with blurred vision, then the patient should immediately seek attention at an urgent or emergency care unit, as these may be signs of an impending stroke. °  ° °Emergency Department Pain Levels (6-10/10)  °Emergency Room Pain 6 Severely limiting. Requires emergency care and should not be seen or managed at an outpatient pain management facility. Communication becomes difficult and requires great effort. Assistance to reach the emergency department may be required. Facial flushing and profuse sweating along with potentially dangerous increases in heart rate and blood pressure will be evident. °  °Distressing pain 7 Self-care is very difficult. Assistance is required to transport, or use restroom. Assistance to reach the emergency department will be required. Tasks requiring coordination, such as bathing and getting dressed become very difficult. °  °Disabling pain 8 Self-care is no longer possible. At this level, pain is disabling. The individual is   unable to do even the most “basic” activities such as walking, eating, bathing,  dressing, transferring to a bed, or toileting. Fine motor skills are lost. It is difficult to think clearly. °  °Incapacitating pain 9 Pain becomes incapacitating. Thought processing is no longer possible. Difficult to remember your own name. Control of movement and coordination are lost. °  °The worst pain imaginable 10 At this level, most patients pass out from pain. When this level is reached, collapse of the autonomic nervous system occurs, leading to a sudden drop in blood pressure and heart rate. This in turn results in a temporary and dramatic drop in blood flow to the brain, leading to a loss of consciousness. Fainting is one of the body’s self defense mechanisms. Passing out puts the brain in a calmed state and causes it to shut down for a while, in order to begin the healing process. °  ° °Summary: °1. Refer to this scale when providing us with your pain level. °2. Be accurate and careful when reporting your pain level. This will help with your care. °3. Over-reporting your pain level will lead to loss of credibility. °4. Even a level of 1/10 means that there is pain and will be treated at our facility. °5. High, inaccurate reporting will be documented as “Symptom Exaggeration”, leading to loss of credibility and suspicions of possible secondary gains such as obtaining more narcotics, or wanting to appear disabled, for fraudulent reasons. °6. Only pain levels of 5 or below will be seen at our facility. °7. Pain levels of 6 and above will be sent to the Emergency Department and the appointment cancelled. °____________________________________________________________________________________________ ° ° °____________________________________________________________________________________________ ° °Medication Rules ° °Applies to: All patients receiving prescriptions (written or electronic). ° °Pharmacy of record: Pharmacy where electronic prescriptions will be sent. If written prescriptions are taken to a  different pharmacy, please inform the nursing staff. The pharmacy listed in the electronic medical record should be the one where you would like electronic prescriptions to be sent. ° °Prescription refills: Only during scheduled appointments. Applies to both, written and electronic prescriptions. ° °NOTE: The following applies primarily to controlled substances (Opioid Pain Medications) ° °Patient's responsibilities: °1. Pain Pills: Bring all pain pills to every appointment (except for procedure appointments). °2. Pill Bottles: Bring pills in original pharmacy bottle. Always bring newest bottle. Bring bottle, even if empty. °3. Medication refills: You are responsible for knowing and keeping track of what medications you need refilled. The day before your appointment, write a list of all prescriptions that need to be refilled. Bring that list to your appointment and give it to the admitting nurse. Prescriptions will be written only during appointments. If you forget a medication, it will not be "Called in", "Faxed", or "electronically sent". You will need to get another appointment to get these prescribed. °4. Prescription Accuracy: You are responsible for carefully inspecting your prescriptions before leaving our office. Have the discharge nurse carefully go over each prescription with you, before taking them home. Make sure that your name is accurately spelled, that your address is correct. Check the name and dose of your medication to make sure it is accurate. Check the number of pills, and the written instructions to make sure they are clear and accurate. Make sure that you are given enough medication to last until your next medication refill appointment. °5. Taking Medication: Take medication as prescribed. Never take more pills than instructed. Never take medication more frequently than prescribed. Taking less pills or less   frequently is permitted and encouraged, when it comes to controlled substances (written  prescriptions).  °6. Inform other Doctors: Always inform, all of your healthcare providers, of all the medications you take. °7. Pain Medication from other Providers: You are not allowed to accept any additional pain medication from any other Doctor or Healthcare provider. There are two exceptions to this rule. (see below) In the event that you require additional pain medication, you are responsible for notifying us, as stated below. °8. Medication Agreement: You are responsible for carefully reading and following our Medication Agreement. This must be signed before receiving any prescriptions from our practice. Safely store a copy of your signed Agreement. Violations to the Agreement will result in no further prescriptions. (Additional copies of our Medication Agreement are available upon request.) °9. Laws, Rules, & Regulations: All patients are expected to follow all Federal and State Laws, Statutes, Rules, & Regulations. Ignorance of the Laws does not constitute a valid excuse. ° °Exceptions: There are only two exceptions to the rule of not receiving pain medications from other Healthcare Providers. °1. Exception #1 (Emergencies): In the event of an emergency (i.e.: accident requiring emergency care), you are allowed to receive additional pain medication. However, you are responsible for: As soon as you are able, call our office [phone], at any time of the day or night, and leave a message stating your name, the date and nature of the emergency, and the name and dose of the medication prescribed. In the event that your call is answered by a member of our staff, make sure to document and save the date, time, and the name of the person that took your information.  °2. Exception #2 (Planned Surgery): In the event that you are scheduled by another doctor or dentist to have any type of surgery or procedure, you are allowed (for a period no longer than 30 days), to receive additional pain medication, for the  acute post-op pain. However, in this case, you are responsible for picking up a copy of our "Post-op Pain Management for Surgeons" handout, and giving it to your surgeon or dentist. This document is available at our office, and does not require an appointment to obtain it. Simply go to our office during business hours (Monday-Thursday from 8:00 AM to 4:00 PM) (Friday 8:00 AM to 12:00 Noon) or if you have a scheduled appointment with us, prior to your surgery, and ask for it by name. In addition, you will need to provide us with your name, name of your surgeon, type of surgery, and date of procedure or surgery. ° °____________________________________________________________________________________________ °____________________________________________________________________________________________ ° °DRUG HOLIDAYS ° °Definitions °Tolerance: defined as the progressively decreased responsiveness to a drug. Occurs when the drug is used repeatedly and the body adapts to the continued presence of the drug. As a result, a larger dose of the drug is needed to achieve the effect originally obtained by a smaller dose. It is thought to be due to the formation of excess opioid receptors. ° °Drug Holiday: is when a patient stops taking a medication(s) for a period of time; anywhere from a few days to several weeks. ° °Withdrawals: refers to the wide range of symptoms that occur after stopping or dramatically reducing opiate drugs after heavy and prolonged use. Withdrawal symptoms do not occur to patients that use low dose opioids, or those who take the medication sporadically. Contrary to benzodiazepine (example: Valium, Xanax, etc.) or alcohol withdrawals (“Delirium Tremens”), opioid withdrawals are not lethal. Withdrawals are the physical   manifestation of the body getting rid of the excess receptors. ° °Purpose °To eliminate tolerance. ° °Duration of Holiday °14 consecutive days. (2 weeks) ° °Expected Symptoms °Early symptoms  of withdrawal include: °• Agitation °• Anxiety °• Muscle aches °• Increased tearing °• Insomnia °• Runny nose °• Sweating °• Yawning ° °Late symptoms of withdrawal include: °• Abdominal cramping °• Diarrhea °• Dilated pupils °• Goose bumps °• Nausea °• Vomiting ° °Opioid withdrawal reactions are very uncomfortable but are not life-threatening. Symptoms usually start within °12 hours of last opioid dose and within 30 hours of last methadone exposure. ° °Duration of Symptoms °48 to 72 hours for short acting medications and 2 to 14 days for methadone. ° °Treatment °• Clonidine (Catapres™) or tizanidine (Zanaflex™) for agitation, sweating, tearing, runny nose. °• Promethazine (Phenergan™) for nausea, vomiting. °• NSAIDs for pain. ° °Benefits °• Improved effectiveness of opioids. °• Decreased opioid dose needed to achieve benefits. °• Improved pain with lesser dose. °____________________________________________________________________________________________ ° ° °

## 2016-09-01 NOTE — Progress Notes (Signed)
Safety precautions to be maintained throughout the outpatient stay will include: orient to surroundings, keep bed in low position, maintain call bell within reach at all times, provide assistance with transfer out of bed and ambulation.  

## 2016-09-01 NOTE — Progress Notes (Signed)
Results were reviewed and found to be: abnormal  Further testing may be useful  Review would suggest interventional pain management techniques may be of benefit

## 2016-09-01 NOTE — Progress Notes (Signed)
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 reality, the score is likely to be a combination of pain intensity and pain affect, where pain affect describes the degree of emotional arousal or changes in action readiness caused by the sensory experience of pain. Factors such as social and work situation, setting, emotional state, anxiety levels, expectation, and prior pain experience may influence pain perception and show large inter-individual differences that may also be affected by time variables.  Patient instructions provided during this appointment: Patient Instructions    ____________________________________________________________________________________________  Appointment Policy  It is our goal and responsibility to provide the medical community with assistance in the evaluation and  management of patients with chronic pain. Unfortunately our resources are limited. Because we do not have an unlimited amount of time, or available appointments, we are required to closely monitor and manage their use. The following rules exist to maximize their use:  Patient's responsibilities: 1. Punctuality: You are required to be physically present and registered in our facility at least 30 minutes before your appointment. 2. Tardiness: The cutoff is your appointment time. If you have an appointment scheduled for 10:00 AM and you arrive at 10:01, you will be required to reschedule your appointment.  3. Plan ahead: Always assume that you will encounter traffic on  your way in. Plan for it. If you are dependent on a driver, make sure they understand these rules and the need to arrive early. 4. Other appointments and responsibilities: Avoid scheduling any other appointments before or after your pain clinic appointments.  5. Be prepared: Write down everything that you need to discuss with your healthcare provider and give this information to the admitting nurse. Write down the medications that you will need refilled. Bring your pills and bottles (even the empty ones), to all of your appointments, except for those where a procedure is scheduled. 6. No children or pets: Find someone to take care of them. It is not appropriate to bring them in. 7. Scheduling changes: We request "advanced notification" of any changes or cancellations. 8. Advanced notification: Defined as a time period of more than 24 hours prior to the originally scheduled appointment. This allows for the appointment to be offered to other patients. 9. Rescheduling: When a visit is rescheduled, it will require the cancellation of the original appointment. For this reason they both fall within the category of "Cancellations".  10. Cancellations: They require advanced notification. Any cancellation less than 24 hours before the  appointment  will be recorded as a "No Show". 11. No Show: Defined as an unkept appointment where the patient failed to notify or declare to the practice their intention or inability to keep the appointment.  Corrective process for repeat offenders:  1. Tardiness: Three (3) episodes of rescheduling due to late arrivals will be recorded as one (1) "No Show". 2. Cancellation or reschedule: Three (3) cancellations or rescheduling will be recorded as one (1) "No Show". 3. "No Shows": Three (3) "No Shows" within a 12 month period will result in discharge from the practice.  ____________________________________________________________________________________________  ____________________________________________________________________________________________  Pain Scale  Introduction: The pain score used by this practice is the Verbal Numerical Rating Scale (VNRS-11). This is an 11-point scale. It is for adults and children 10 years or older. There are significant differences in how the pain score is reported, used, and applied. Forget everything you learned in the past and learn this scoring system.  General Information: The scale should reflect your current level of pain. Unless you are specifically asked for the level of your worst pain, or your average pain. If you are asked for one of these two, then it should be understood that it is over the past 24 hours.  Basic Activities of Daily Living (ADL): Personal hygiene, dressing, eating, transferring, and using restroom.  Instructions: Most patients tend to report their level of pain as a combination of two factors, their physical pain and their psychosocial pain. This last one is also known as suffering and it is reflection of how physical pain affects you socially and psychologically. From now on, report them separately. From this point on, when asked to report your pain level, report only your physical pain. Use the following table for reference.  Pain  Clinic Pain Levels (0-5/10)  Pain Level Score  Description  No Pain 0   Mild pain 1 Nagging, annoying, but does not interfere with basic activities of daily living (ADL). Patients are able to eat, bathe, get dressed, toileting (being able to get on and off the toilet and perform personal hygiene functions), transfer (move in and out of bed or a chair without assistance), and maintain continence (able to control bladder and bowel functions). Blood pressure and heart rate are unaffected. A normal heart rate for a healthy adult ranges from 60  to 100 bpm (beats per minute).   Mild to moderate pain 2 Noticeable and distracting. Impossible to hide from other people. More frequent flare-ups. Still possible to adapt and function close to normal. It can be very annoying and may have occasional stronger flare-ups. With discipline, patients may get used to it and adapt.   Moderate pain 3 Interferes significantly with activities of daily living (ADL). It becomes difficult to feed, bathe, get dressed, get on and off the toilet or to perform personal hygiene functions. Difficult to get in and out of bed or a chair without assistance. Very distracting. With effort, it can be ignored when deeply involved in activities.   Moderately severe pain 4 Impossible to ignore for more than a few minutes. With effort, patients may still be able to manage work or participate in some social activities. Very difficult to concentrate. Signs of autonomic nervous system discharge are evident: dilated pupils (mydriasis); mild sweating (diaphoresis); sleep interference. Heart rate becomes elevated (>115 bpm). Diastolic blood pressure (lower number) rises above 100 mmHg. Patients find relief in laying down and not moving.   Severe pain 5 Intense and extremely unpleasant. Associated with frowning face and frequent crying. Pain overwhelms the senses.  Ability to do any activity or maintain social relationships becomes significantly limited.  Conversation becomes difficult. Pacing back and forth is common, as getting into a comfortable position is nearly impossible. Pain wakes you up from deep sleep. Physical signs will be obvious: pupillary dilation; increased sweating; goosebumps; brisk reflexes; cold, clammy hands and feet; nausea, vomiting or dry heaves; loss of appetite; significant sleep disturbance with inability to fall asleep or to remain asleep. When persistent, significant weight loss is observed due to the complete loss of appetite and sleep deprivation.  Blood pressure and heart rate becomes significantly elevated. Caution: If elevated blood pressure triggers a pounding headache associated with blurred vision, then the patient should immediately seek attention at an urgent or emergency care unit, as these may be signs of an impending stroke.    Emergency Department Pain Levels (6-10/10)  Emergency Room Pain 6 Severely limiting. Requires emergency care and should not be seen or managed at an outpatient pain management facility. Communication becomes difficult and requires great effort. Assistance to reach the emergency department may be required. Facial flushing and profuse sweating along with potentially dangerous increases in heart rate and blood pressure will be evident.   Distressing pain 7 Self-care is very difficult. Assistance is required to transport, or use restroom. Assistance to reach the emergency department will be required. Tasks requiring coordination, such as bathing and getting dressed become very difficult.   Disabling pain 8 Self-care is no longer possible. At this level, pain is disabling. The individual is unable to do even the most basic activities such as walking, eating, bathing, dressing, transferring to a bed, or toileting. Fine motor skills are lost. It is difficult to think clearly.   Incapacitating pain 9 Pain becomes incapacitating. Thought processing is no longer possible. Difficult to remember your  own name. Control of movement and coordination are lost.   The worst pain imaginable 10 At this level, most patients pass out from pain. When this level is reached, collapse of the autonomic nervous system occurs, leading to a sudden drop in blood pressure and heart rate. This in turn results in a temporary and dramatic drop in blood flow to the brain, leading to a loss of consciousness. Fainting is one of the bodys self defense mechanisms. Passing  out puts the brain in a calmed state and causes it to shut down for a while, in order to begin the healing process.    Summary: 1. Refer to this scale when providing [place] with your pain level. 2. Be accurate and careful when reporting your pain level. This will help with your care. 3. Over-reporting your pain level will lead to loss of credibility. 4. Even a level of 1/10 means that there is pain and will be treated at our facility. 5. High, inaccurate reporting will be documented as Symptom Exaggeration, leading to loss of credibility and suspicions of possible secondary gains such as obtaining more narcotics, or wanting to appear disabled, for fraudulent reasons. 6. Only pain levels of 5 or below will be seen at our facility. 7. Pain levels of 6 and above will be sent to the Emergency Department and the appointment cancelled. ____________________________________________________________________________________________   ____________________________________________________________________________________________  Medication Rules  Applies to: All patients receiving prescriptions (written or electronic).  Pharmacy of record: Pharmacy where electronic prescriptions will be sent. If written prescriptions are taken to a different pharmacy, please inform the nursing staff. The pharmacy listed in the electronic medical record should be the one where you would like electronic prescriptions to be sent.  Prescription refills: Only during scheduled  appointments. Applies to both, written and electronic prescriptions.  NOTE: The following applies primarily to controlled substances (Opioid Pain Medications)  Patient's responsibilities: 1. Pain Pills: Bring all pain pills to every appointment (except for procedure appointments). 2. Pill Bottles: Bring pills in original pharmacy bottle. Always bring newest bottle. Bring bottle, even if empty. 3. Medication refills: You are responsible for knowing and keeping track of what medications you need refilled. The day before your appointment, write a list of all prescriptions that need to be refilled. Bring that list to your appointment and give it to the admitting nurse. Prescriptions will be written only during appointments. If you forget a medication, it will not be "Called in", "Faxed", or "electronically sent". You will need to get another appointment to get these prescribed. 4. Prescription Accuracy: You are responsible for carefully inspecting your prescriptions before leaving our office. Have the discharge nurse carefully go over each prescription with you, before taking them home. Make sure that your name is accurately spelled, that your address is correct. Check the name and dose of your medication to make sure it is accurate. Check the number of pills, and the written instructions to make sure they are clear and accurate. Make sure that you are given enough medication to last until your next medication refill appointment. 5. Taking Medication: Take medication as prescribed. Never take more pills than instructed. Never take medication more frequently than prescribed. Taking less pills or less frequently is permitted and encouraged, when it comes to controlled substances (written prescriptions).  6. Inform other Doctors: Always inform, all of your healthcare providers, of all the medications you take. 7. Pain Medication from other Providers: You are not allowed to accept any additional pain medication  from any other Doctor or Healthcare provider. There are two exceptions to this rule. (see below) In the event that you require additional pain medication, you are responsible for notifying us, as stated below. 8. Medication Agreement: You are responsible for carefully reading and following our Medication Agreement. This must be signed before receiving any prescriptions from our practice. Safely store a copy of your signed Agreement. Violations to the Agreement will result in no further prescriptions. (Additional copies of our Medication  Agreement are available upon request.) 9. Laws, Rules, & Regulations: All patients are expected to follow all Federal and [employer], Rules, [employer]. Ignorance of the Laws does not constitute a valid excuse.  Exceptions: There are only two exceptions to the rule of not receiving pain medications from other Healthcare Providers. 1. Exception #1 (Emergencies): In the event of an emergency (i.e.: accident requiring emergency care), you are allowed to receive additional pain medication. However, you are responsible for: As soon as you are able, call our office [phone], at any time of the day or night, and leave a message stating your name, the date and nature of the emergency, and the name and dose of the medication prescribed. In the event that your call is answered by a member of our staff, make sure to document and save the date, time, and the name of the person that took your information.  2. Exception #2 (Planned Surgery): In the event that you are scheduled by another doctor or dentist to have any type of surgery or procedure, you are allowed (for a period no longer than 30 days), to receive additional pain medication, for the acute post-op pain. However, in this case, you are responsible for picking up a copy of our "Post-op Pain Management for Surgeons" handout, and giving it to your surgeon or dentist. This document is available at our office, and  does not require an appointment to obtain it. Simply go to our office during business hours (Monday-Thursday from 8:00 AM to 4:00 PM) (Friday 8:00 AM to 12:00 Noon) or if you have a scheduled appointment with [place], prior to your surgery, and ask for it by name. In addition, you will need to provide [place] with your name, name of your surgeon, type of surgery, and date of procedure or surgery.  ____________________________________________________________________________________________ ____________________________________________________________________________________________  DRUG HOLIDAYS  Definitions Tolerance: defined as the progressively decreased responsiveness to a drug. Occurs when the drug is used repeatedly and the body adapts to the continued presence of the drug. As a result, a larger dose of the drug is needed to achieve the effect originally obtained by a smaller dose. It is thought to be due to the formation of excess opioid receptors.  Drug Holiday: is when a patient stops taking a medication(s) for a period of time; anywhere from a few days to several weeks.  Withdrawals: refers to the wide range of symptoms that occur after stopping or dramatically reducing opiate drugs after heavy and prolonged use. Withdrawal symptoms do not occur to patients that use low dose opioids, or those who take the medication sporadically. Contrary to benzodiazepine (example: Valium, Xanax, etc.) or alcohol withdrawals (Delirium Tremens), opioid withdrawals are not lethal. Withdrawals are the physical manifestation of the body getting rid of the excess receptors.  Purpose To eliminate tolerance.  Duration of Holiday 14 consecutive days. (2 weeks)  Expected Symptoms Early symptoms of withdrawal include:  Agitation  Anxiety  Muscle aches  Increased tearing  Insomnia  Runny nose  Sweating  Yawning  Late symptoms of withdrawal include:  Abdominal cramping  Diarrhea  Dilated  pupils  Goose bumps  Nausea  Vomiting  Opioid withdrawal reactions are very uncomfortable but are not life-threatening. Symptoms usually start within 12 hours of last opioid dose and within 30 hours of last methadone exposure.  Duration of Symptoms 48 to 72 hours for short acting medications and 2 to 14 days for methadone.  Treatment  Clonidine (Catapres) or tizanidine (Zanaflex) for  agitation, sweating, tearing, runny nose.  Promethazine (Phenergan) for nausea, vomiting.  NSAIDs for pain.  Benefits  Improved effectiveness of opioids.  Decreased opioid dose needed to achieve benefits.  Improved pain with lesser dose. ____________________________________________________________________________________________

## 2016-09-01 NOTE — Progress Notes (Signed)
Results were reviewed and found to be: mildly abnormal  No acute injury or pathology identified  Review would suggest interventional pain management techniques may be of benefit 

## 2016-09-01 NOTE — Progress Notes (Signed)
Results were reviewed and found to be: abnormal  No acute injury or pathology identified  Review would suggest interventional pain management techniques may be of benefit

## 2016-09-01 NOTE — Progress Notes (Signed)
Results were reviewed and found to be: mildly abnormal  Initial impression would suggest no surgically correctable pathology  Review would suggest interventional pain management techniques may be of benefit  

## 2016-09-05 LAB — COMPREHENSIVE METABOLIC PANEL
ALT: 19 IU/L (ref 0–32)
AST: 20 IU/L (ref 0–40)
Albumin/Globulin Ratio: 2 (ref 1.2–2.2)
Albumin: 4.5 g/dL (ref 3.6–4.8)
Alkaline Phosphatase: 123 IU/L — ABNORMAL HIGH (ref 39–117)
BUN/Creatinine Ratio: 17 (ref 12–28)
BUN: 20 mg/dL (ref 8–27)
Bilirubin Total: 0.4 mg/dL (ref 0.0–1.2)
CALCIUM: 9.5 mg/dL (ref 8.7–10.3)
CO2: 27 mmol/L (ref 20–29)
CREATININE: 1.17 mg/dL — AB (ref 0.57–1.00)
Chloride: 102 mmol/L (ref 96–106)
GFR, EST AFRICAN AMERICAN: 57 mL/min/{1.73_m2} — AB (ref >59)
GFR, EST NON AFRICAN AMERICAN: 49 mL/min/{1.73_m2} — AB (ref >59)
Globulin, Total: 2.2 g/dL (ref 1.5–4.5)
Glucose: 92 mg/dL (ref 65–99)
Potassium: 4.9 mmol/L (ref 3.5–5.2)
SODIUM: 145 mmol/L — AB (ref 134–144)
TOTAL PROTEIN: 6.7 g/dL (ref 6.0–8.5)

## 2016-09-05 LAB — 25-HYDROXYVITAMIN D LCMS D2+D3

## 2016-09-05 LAB — SEDIMENTATION RATE: Sed Rate: 5 mm/hr (ref 0–40)

## 2016-09-05 LAB — COMPLIANCE DRUG ANALYSIS, UR

## 2016-09-05 LAB — 25-HYDROXY VITAMIN D LCMS D2+D3
25-Hydroxy, Vitamin D-3: 13 ng/mL
25-Hydroxy, Vitamin D: 13 ng/mL — ABNORMAL LOW

## 2016-09-05 LAB — VITAMIN B12: VITAMIN B 12: 365 pg/mL (ref 232–1245)

## 2016-09-05 LAB — MAGNESIUM: MAGNESIUM: 2.3 mg/dL (ref 1.6–2.3)

## 2016-09-05 LAB — C-REACTIVE PROTEIN: CRP: 3.5 mg/L (ref 0.0–4.9)

## 2016-09-14 ENCOUNTER — Encounter: Payer: Self-pay | Admitting: Nurse Practitioner

## 2016-09-14 ENCOUNTER — Other Ambulatory Visit: Payer: Self-pay | Admitting: Nurse Practitioner

## 2016-09-14 ENCOUNTER — Telehealth: Payer: Self-pay | Admitting: *Deleted

## 2016-09-14 DIAGNOSIS — E559 Vitamin D deficiency, unspecified: Secondary | ICD-10-CM | POA: Insufficient documentation

## 2016-09-14 MED ORDER — VITAMIN D (ERGOCALCIFEROL) 1.25 MG (50000 UNIT) PO CAPS: ORAL_CAPSULE | ORAL | 0 refills | Status: AC

## 2016-09-14 NOTE — Telephone Encounter (Signed)
-----   Message from Vevelyn Francois, NP sent at 09/14/2016  9:08 AM EDT ----- Regarding: Vitamin D Please call pt and make her aware that her Vitamin D is 13. I have sent her in weekly vitamin D for 12 weeks. She will then start OTC daily. Recheck vit d level annually. Thanks

## 2016-09-14 NOTE — Telephone Encounter (Signed)
Patient returned our call concerning her low Vitamin D level. Instructed to pick up her prescription of Vitamin D to take weekly for 12 weeks, then take OTC Vitamin D daily after that.

## 2016-09-14 NOTE — Telephone Encounter (Signed)
Attempted to call patient to inform her of Vitamin D results. Message left. 

## 2016-11-28 ENCOUNTER — Other Ambulatory Visit: Payer: Self-pay | Admitting: Family

## 2016-11-28 DIAGNOSIS — I10 Essential (primary) hypertension: Secondary | ICD-10-CM

## 2017-02-15 ENCOUNTER — Telehealth: Payer: Self-pay | Admitting: Family

## 2017-02-15 ENCOUNTER — Other Ambulatory Visit: Payer: Self-pay

## 2017-02-15 MED ORDER — OMEPRAZOLE 40 MG PO CPDR: DELAYED_RELEASE_CAPSULE | ORAL | 6 refills | Status: DC

## 2017-02-15 NOTE — Telephone Encounter (Signed)
rx sent to [facility]

## 2017-02-15 NOTE — Telephone Encounter (Signed)
Copied from [place] [id number]. Topic: Quick Communication - Rx Refill/Question >> Feb 15, 2017 12:24 PM Oliver Pila B wrote: Pt is needing a refill on her prilosec, contact pt if needed, but pt was told to contact pharmacy

## 2017-02-22 ENCOUNTER — Other Ambulatory Visit: Payer: Self-pay | Admitting: Family

## 2017-02-22 ENCOUNTER — Ambulatory Visit (INDEPENDENT_AMBULATORY_CARE_PROVIDER_SITE_OTHER): Payer: Medicare Other | Admitting: Family Medicine

## 2017-02-22 ENCOUNTER — Encounter: Payer: Self-pay | Admitting: Family Medicine

## 2017-02-22 VITALS — BP 132/80 | HR 95 | Temp 98.4°F | Ht 60.0 in | Wt 157.2 lb

## 2017-02-22 DIAGNOSIS — R195 Other fecal abnormalities: Secondary | ICD-10-CM

## 2017-02-22 DIAGNOSIS — I10 Essential (primary) hypertension: Secondary | ICD-10-CM

## 2017-02-22 DIAGNOSIS — R109 Unspecified abdominal pain: Secondary | ICD-10-CM

## 2017-02-22 LAB — CBC WITH DIFFERENTIAL/PLATELET
Basophils Absolute: 0.1 10*3/uL (ref 0.0–0.1)
Basophils Relative: 0.7 % (ref 0.0–3.0)
EOS ABS: 0.1 10*3/uL (ref 0.0–0.7)
EOS PCT: 1.5 % (ref 0.0–5.0)
HEMATOCRIT: 43.1 % (ref 36.0–46.0)
HEMOGLOBIN: 14 g/dL (ref 12.0–15.0)
LYMPHS PCT: 31 % (ref 12.0–46.0)
Lymphs Abs: 2.5 10*3/uL (ref 0.7–4.0)
MCHC: 32.6 g/dL (ref 30.0–36.0)
MCV: 93.1 fl (ref 78.0–100.0)
MONO ABS: 0.5 10*3/uL (ref 0.1–1.0)
Monocytes Relative: 6 % (ref 3.0–12.0)
Neutro Abs: 5 10*3/uL (ref 1.4–7.7)
Neutrophils Relative %: 60.8 % (ref 43.0–77.0)
Platelets: 367 10*3/uL (ref 150.0–400.0)
RBC: 4.63 Mil/uL (ref 3.87–5.11)
RDW: 14 % (ref 11.5–15.5)
WBC: 8.2 10*3/uL (ref 4.0–10.5)

## 2017-02-22 LAB — LIPASE: LIPASE: 29 U/L (ref 11.0–59.0)

## 2017-02-22 NOTE — Patient Instructions (Addendum)
Please go to the lab before you leave.  We will review the tests that have been ordered and if symptoms are not improving, worsening, or you develop new symptoms follow up for further evaluation and treatment.  You will be contact about your referral.  Please let us know if you have not heard back within 1 week about your referral.    Diarrhea, Adult Diarrhea is when you have loose and water poop (stool) often. Diarrhea can make you feel weak and cause you to get dehydrated. Dehydration can make you tired and thirsty, make you have a dry mouth, and make it so you pee (urinate) less often. Diarrhea often lasts 2-3 days. However, it can last longer if it is a sign of something more serious. It is important to treat your diarrhea as told by your doctor. Follow these instructions at home: Eating and drinking  Follow these recommendations as told by your doctor:  Take an oral rehydration solution (ORS). This is a drink that is sold at pharmacies and stores.  Drink clear fluids, such as: ? Water. ? Ice chips. ? Diluted fruit juice. ? Low-calorie sports drinks.  Eat bland, easy-to-digest foods in small amounts as you are able. These foods include: ? Bananas. ? Applesauce. ? Rice. ? Low-fat (lean) meats. ? Toast. ? Crackers.  Avoid drinking fluids that have a lot of sugar or caffeine in them.  Avoid alcohol.  Avoid spicy or fatty foods.  General instructions   Drink enough fluid to keep your pee (urine) clear or pale yellow.  Wash your hands often. If you cannot use soap and water, use hand sanitizer.  Make sure that all people in your home wash their hands well and often.  Take over-the-counter and prescription medicines only as told by your doctor.  Rest at home while you get better.  Watch your condition for any changes.  Take a warm bath to help with any burning or pain from having diarrhea.  Keep all follow-up visits as told by your doctor. This is  important. Contact a doctor if:  You have a fever.  Your diarrhea gets worse.  You have new symptoms.  You cannot keep fluids down.  You feel light-headed or dizzy.  You have a headache.  You have muscle cramps. Get help right away if:  You have chest pain.  You feel very weak or you pass out (faint).  You have bloody or black poop or poop that look like tar.  You have very bad pain, cramping, or bloating in your belly (abdomen).  You have trouble breathing or you are breathing very quickly.  Your heart is beating very quickly.  Your skin feels cold and clammy.  You feel confused.  You have signs of dehydration, such as: ? Dark pee, hardly any pee, or no pee. ? Cracked lips. ? Dry mouth. ? Sunken eyes. ? Sleepiness. ? Weakness. This information is not intended to replace advice given to you by your health care provider. Make sure you discuss any questions you have with your health care provider. Document Released: 08/26/2007 Document Revised: 09/27/2015 Document Reviewed: 11/13/2014 Elsevier Interactive Patient Education  2018 [employer].

## 2017-02-22 NOTE — Progress Notes (Signed)
Pre visit review using our clinic review tool, if applicable. No additional management support is needed unless otherwise documented below in the visit note. 

## 2017-02-22 NOTE — Progress Notes (Signed)
Subjective:    Patient ID: Hannah [name], female    DOB: [birth year], 66 y.o.   MRN: [id number]  HPI  Hannah [name] is a 66 year old female who presents today with loose stools and constipation that occurred about 10 days ago. She reports that this is "just another episode" as this has been present intermittently for the past 4 years.  She states that her "episodes" have been going on biweekly and she has decided to "finally" seek care. Last colonoscopy was 05/09/15 where she was found to have one 5 mm polyp in ascending colon that was resected. It was otherwise noted as normal. Pathology negative for IBD or colitis.  Associated abdominal cramping with loose stools. She states that pain can be sharp to dull and cramping and improves after BM. BMs are loose; not watery. She denies any triggers, abdominal bloating, blood in her stool or dysuria. No history of lactose or gluten sensitivity.  Abdominal Pain: Location: generalized Onset/Timing: 10 days ago "episode" Duration: prior to BM and resolves with BM. Intermittent and may last several minutes to aching that can be present for a "few hours" Severity/Quality: aching/cramping/dull at times Worse/Better by: nothing makes it worse; improvement after BM Associated Symptoms: loose stools No recent antibiotic use or suspicious food use  ROS Loss of appetite: No Vomiting: No Diarrhea: loose stools with abdominal cramping Rectal bleeding: No Fever: No Weight loss: No  Meds, Vitals, and allergies reviewed   Review of Systems  Constitutional: Negative for chills, fatigue and fever.  Respiratory: Negative for cough, shortness of breath and wheezing.   Cardiovascular: Negative for chest pain and palpitations.  Gastrointestinal: Positive for abdominal pain, constipation and diarrhea. Negative for abdominal distention, blood in stool, nausea and vomiting.  Genitourinary: Negative for dysuria, frequency and hematuria.  Musculoskeletal:  Negative for myalgias.  Skin: Negative for rash.  Neurological: Negative for dizziness, light-headedness and headaches.   Past Medical History:  Diagnosis Date  . Carotid artery occlusion   . Degenerative joint disease of spine   . Depression    Followed in past by Dr. Randel Books  . GERD (gastroesophageal reflux disease)   . Heart murmur   . Hypercholesteremia   . Hyperlipidemia   . Hypertension   . Thyroid disease      Social History   Socioeconomic History  . Marital status: Widowed    Spouse name: Not on file  . Number of children: Not on file  . Years of education: Not on file  . Highest education level: Not on file  Social Needs  . Financial resource strain: Not on file  . Food insecurity - worry: Not on file  . Food insecurity - inability: Not on file  . Transportation needs - medical: Not on file  . Transportation needs - non-medical: Not on file  Occupational History  . Not on file  Tobacco Use  . Smoking status: Never Smoker  . Smokeless tobacco: Never Used  Substance and Sexual Activity  . Alcohol use: No    Alcohol/week: 0.0 oz  . Drug use: No  . Sexual activity: Not Currently  Other Topics Concern  . Not on file  Social History Narrative   Lives with nephew in [place]. Helps with care of 2 grand-nephews.   Husband deceased.      Work - previously as Geophysicist/field seismologist      Sister is also patient-  Psychologist, clinical       Past Surgical History:  Procedure Laterality Date  . ABDOMINAL HYSTERECTOMY  1985   'has part of cervix left'.   . ADENOIDECTOMY    . APPENDECTOMY    . broken wrist  Left   . CHOLECYSTECTOMY    . FRACTURE SURGERY Left   . THYROIDECTOMY Right 02/28/2015   Procedure: THYROIDECTOMY;  Surgeon: Carloyn Manner, MD;  Location: [facility] ORS;  Service: ENT;  Laterality: Right;  . TONSILLECTOMY    . VAGINAL DELIVERY      Family History  Problem Relation Age of Onset  . Hypertension Mother   . Hyperlipidemia Mother   . Heart  disease Mother        MI  . Anxiety disorder Mother   . Drug abuse Mother   . Cancer Sister        breast  . Social phobia Sister   . Alcohol abuse Father   . Anxiety disorder Father   . Drug abuse Father   . Drug abuse Sister   . Anxiety disorder Sister   . Alcohol abuse Sister   . Breast cancer Sister   . Bipolar disorder Sister   . Colon cancer Neg Hx     Allergies  Allergen Reactions  . No Known Allergies     Current Outpatient Medications on File Prior to Visit  Medication Sig Dispense Refill  . amLODipine (NORVASC) 5 MG tablet TAKE 1 TABLET BY MOUTH EVERY DAY 90 tablet 0  . omeprazole (PRILOSEC) 40 MG capsule TAKE 1 CAPSULE BY MOUTH EVERY DAY 30 capsule 6  . Vitamin D, Ergocalciferol, (DRISDOL) 50000 units CAPS capsule Take 1 capsule (50,000 Units total) by mouth weekly x 12 weeks. 12 capsule 0   No current facility-administered medications on file prior to visit.     BP 132/80   Pulse 95   Temp 98.4 F (36.9 C) (Oral)   Ht 5' (1.524 m)   Wt 157 lb 3.2 oz (71.3 kg)   LMP  (LMP Unknown)   SpO2 96%   BMI 30.70 kg/m       Objective:   Physical Exam  Constitutional: [name], place, and time. She appears well-developed and well-nourished.  Eyes: Pupils are equal, round, and reactive to light. No scleral icterus.  Neck: Neck supple.  Cardiovascular: Normal rate, regular rhythm and intact distal pulses.  Pulmonary/Chest: Effort normal and breath sounds normal. She has no wheezes. She has no rales.  Abdominal: Soft. Bowel sounds are normal. She exhibits no distension, no fluid wave and no ascites. There is no hepatosplenomegaly. There is no rebound, no CVA tenderness, no tenderness at McBurney's point and negative Murphy's sign.  Generalized mild tenderness; no focal tenderness  Lymphadenopathy:    She has no cervical adenopathy.  Neurological: She is alert and oriented to person, place, and time. Coordination normal.  Skin: Skin is warm and  dry. No rash noted.  Psychiatric: She has a normal mood and affect. Her behavior is normal. Judgment and thought content normal.        Assessment & Plan:  1. Abdominal cramping Nonspecific symptom and noted by patient as an "episode."  Colonoscopy in 2017 provides reassurance as the pathology was negative for inflammatory bowel disease or colitis. Will check lab and stool for C-diff, and also provided guaiac stool cards to take home for evaluation. Further placed a referral to GI with prior history of symptoms; she is interested in an evaluation from GI.  - CBC with Differential/Platelet - Lipase - Ambulatory referral  to Gastroenterology  2. Loose stools She is hydrated and denies any recent trigger. Will check C-diff and lab work as discussed above.  - Clostridium difficile culture-fecal  We reviewed return precautions and she will follow up if symptoms worsen or new symptoms develop.  Delano Metz, FNP-C

## 2017-03-03 ENCOUNTER — Encounter: Payer: Self-pay | Admitting: Gastroenterology

## 2017-05-10 ENCOUNTER — Other Ambulatory Visit: Payer: Self-pay | Admitting: Family

## 2017-05-10 DIAGNOSIS — I10 Essential (primary) hypertension: Secondary | ICD-10-CM

## 2017-05-25 ENCOUNTER — Telehealth: Payer: Self-pay | Admitting: Family

## 2017-05-25 NOTE — Telephone Encounter (Signed)
Please mail letter-   Hope you are well.   In reviewing your chart, it appears your are due for annual mammogram.  Please let us know if you would like for me to order. You may call the office to let us know, and we will order for you.   Once the order in the system, you may schedule at your preferred location.   Typically women have been using one of the sites below however you may go where you have been in the past. I would ensure that when you do get a mammogram that it is 3D ( as opposed to 2D which was prior technology). Evidence suggests that 3D is superior.   Please note that NOT all insurance companies cover 3D, and you may have to pay a higher copay. You may call your insurance company to further clarify your benefits.   Options for Mammogram in [place]:    [facility]  [address]  [place], [place]  [phone]   [facility] [address] [place], [place] [phone]   Let us know if you have questions.   My best,   Shaunak Kreis, NP   

## 2017-06-04 ENCOUNTER — Encounter: Payer: Self-pay | Admitting: *Deleted

## 2017-06-04 NOTE — Telephone Encounter (Signed)
Mailed letter °

## 2017-07-09 ENCOUNTER — Other Ambulatory Visit: Payer: Self-pay | Admitting: Family

## 2017-07-23 ENCOUNTER — Other Ambulatory Visit: Payer: Self-pay

## 2017-07-23 DIAGNOSIS — I10 Essential (primary) hypertension: Secondary | ICD-10-CM

## 2017-07-23 MED ORDER — AMLODIPINE BESYLATE 5 MG PO TABS: 5.0000 mg | ORAL_TABLET | Freq: Every day | ORAL | 0 refills | Status: AC

## 2017-07-29 ENCOUNTER — Telehealth: Payer: Self-pay

## 2017-07-29 NOTE — Telephone Encounter (Signed)
Agree with you- Okay to fill and schedule OV  Thanks !

## 2017-07-29 NOTE — Telephone Encounter (Signed)
Copied from [place] [id number]. Topic: General - Other >> Jul 29, 2017  9:55 AM Yvette Rack wrote: Reason for CRM: Claris Che from [facility](P) [phone] states that patient has enrolled into the [facility] on 07-20-17 and want to know if the amLODipine (NORVASC) 5 MG tablet would be approved to there pharmacy   (P) [phone]

## 2017-07-29 NOTE — Telephone Encounter (Signed)
lov 08/20/16 Nov scheduled  Ok to fill and call schedule office visit

## 2017-07-30 NOTE — Telephone Encounter (Signed)
Tried calling patient , unable to complete get call to go through due to calling restrictions

## 2017-08-04 NOTE — Telephone Encounter (Signed)
Patient has appointment scheduled 11/26/17

## 2017-11-26 ENCOUNTER — Ambulatory Visit: Payer: Self-pay | Admitting: Family

## 2017-11-29 ENCOUNTER — Telehealth: Payer: Self-pay | Admitting: Family

## 2017-11-29 NOTE — Telephone Encounter (Signed)
Forms signed.

## 2017-11-29 NOTE — Telephone Encounter (Signed)
Copied from [place] [id number]. Topic: Quick Communication - See Telephone Encounter >> Nov 29, 2017  4:51 PM Percell Belt A wrote: CRM for notification. See Telephone encounter for: 11/29/17.  Pt called in and stated that she had ordered  Cancer Gene swab test from the Internet and they were suppose to sent a fax to provider office to sign off on?  Have you seen anything like this for this patient?   Best number [phone]

## 2017-12-01 NOTE — Telephone Encounter (Signed)
Forms placed on your desk.

## 2017-12-06 ENCOUNTER — Encounter: Payer: Self-pay | Admitting: Family

## 2017-12-06 NOTE — Telephone Encounter (Signed)
Discussed with patient my being uncomfortable with signing test for genes without genetic consult. I do not have that knowledge  Her mother and sister have had breast cancer  She has moved to [facility] and declines referral to oncology OR geneticists.   I advised her with her family h/o to est with PCP and ask for consult with oncology or genetics.

## 2018-01-25 DIAGNOSIS — M21612 Bunion of left foot: Secondary | ICD-10-CM | POA: Diagnosis not present

## 2018-01-25 DIAGNOSIS — I10 Essential (primary) hypertension: Secondary | ICD-10-CM | POA: Diagnosis not present

## 2018-01-25 DIAGNOSIS — G8929 Other chronic pain: Secondary | ICD-10-CM | POA: Diagnosis not present

## 2018-01-25 DIAGNOSIS — M5441 Lumbago with sciatica, right side: Secondary | ICD-10-CM | POA: Diagnosis not present

## 2018-01-25 DIAGNOSIS — Z6830 Body mass index (BMI) 30.0-30.9, adult: Secondary | ICD-10-CM | POA: Diagnosis not present

## 2018-01-25 DIAGNOSIS — M5442 Lumbago with sciatica, left side: Secondary | ICD-10-CM | POA: Diagnosis not present

## 2018-01-25 DIAGNOSIS — R7989 Other specified abnormal findings of blood chemistry: Secondary | ICD-10-CM | POA: Diagnosis not present

## 2018-01-25 DIAGNOSIS — Z1283 Encounter for screening for malignant neoplasm of skin: Secondary | ICD-10-CM | POA: Diagnosis not present

## 2018-01-25 DIAGNOSIS — K219 Gastro-esophageal reflux disease without esophagitis: Secondary | ICD-10-CM | POA: Diagnosis not present

## 2018-02-09 DIAGNOSIS — M5417 Radiculopathy, lumbosacral region: Secondary | ICD-10-CM | POA: Diagnosis not present

## 2018-02-09 DIAGNOSIS — Z79891 Long term (current) use of opiate analgesic: Secondary | ICD-10-CM | POA: Diagnosis not present

## 2018-02-16 DIAGNOSIS — M5417 Radiculopathy, lumbosacral region: Secondary | ICD-10-CM | POA: Diagnosis not present

## 2018-02-23 DIAGNOSIS — M5417 Radiculopathy, lumbosacral region: Secondary | ICD-10-CM | POA: Diagnosis not present

## 2018-02-25 DIAGNOSIS — R7989 Other specified abnormal findings of blood chemistry: Secondary | ICD-10-CM | POA: Diagnosis not present

## 2018-02-25 DIAGNOSIS — I10 Essential (primary) hypertension: Secondary | ICD-10-CM | POA: Diagnosis not present

## 2018-02-26 LAB — CBC WITH AUTO DIFFERENTIAL
Absolute Baso #: 0 10*3/uL (ref 0.0–0.2)
Absolute Eos #: 0.3 10*3/uL (ref 0.0–0.5)
Absolute Lymph #: 2.5 10*3/uL (ref 1.0–3.2)
Absolute Mono #: 0.5 10*3/uL (ref 0.3–1.0)
Basophils %: 0.5 % (ref 0.0–2.0)
Eosinophils %: 4.1 % (ref 0.0–7.0)
Hematocrit: 42.7 % (ref 38.0–47.0)
Hemoglobin: 13.9 g/dL (ref 11.5–15.7)
Immature Grans (Abs): 0 10*3/uL
Immature Granulocytes: 0.1 %
Lymphocytes: 33.8 % (ref 15.0–45.0)
MCH: 29.6 pg (ref 27.0–34.5)
MCHC: 32.6 g/dL (ref 32.0–36.0)
MCV: 90.9 fL (ref 81.0–99.0)
MPV: 9.8 fL (ref 7.2–13.2)
Monocytes: 7.4 % (ref 4.0–12.0)
NRBC Absolute: 0 10*3/uL
NRBC Automated: 0 %
Neutrophils %: 54.1 % (ref 42.0–74.0)
Neutrophils Absolute: 3.9 10*3/uL (ref 1.6–7.3)
Platelets: 319 10*3/uL (ref 140–440)
RBC: 4.7 x10e6/mcL (ref 3.60–5.20)
RDW: 12.8 % (ref 11.0–16.0)
WBC: 7.3 10*3/uL (ref 3.8–10.6)

## 2018-02-26 LAB — COMPREHENSIVE METABOLIC PANEL
ALT: 16 U/L (ref 0–33)
AST: 18 U/L (ref 0–32)
Albumin/Globulin Ratio: 2.1 mmol/L — ABNORMAL HIGH (ref 1.00–2.00)
Albumin: 4.6 g/dL (ref 3.5–5.2)
Alk Phosphatase: 110 U/L (ref 35–117)
Anion Gap: 11 mmol/L (ref 2–17)
BUN: 13 mg/dL (ref 8–23)
CO2: 30 mmol/L — ABNORMAL HIGH (ref 22–29)
Calcium: 9.6 mg/dL (ref 8.8–10.2)
Chloride: 103 mmol/L (ref 98–107)
Creatinine: 0.9 mg/dL (ref 0.5–0.9)
GFR African American: 77 mL/min/{1.73_m2} — ABNORMAL LOW (ref >=90)
GFR Non-African American: 67 mL/min/{1.73_m2} — ABNORMAL LOW (ref >=90)
Globulin: 2 g/dL (ref 1.9–4.4)
Glucose: 92 mg/dL (ref 70–99)
OSMOLALITY CALCULATED: 287 mOsm/kg (ref 270–287)
Potassium: 4.3 mmol/L (ref 3.5–5.3)
Sodium: 144 mmol/L (ref 135–145)
Total Bilirubin: 0.7 mg/dL (ref 0.00–1.20)
Total Protein: 6.8 g/dL (ref 6.4–8.3)

## 2018-02-26 LAB — URINALYSIS WITH REFLEX TO CULTURE
Amorphous, UA: NONE SEEN /HPF
Blood, Urine: NEGATIVE
Glucose, UA: NEGATIVE mg/dL
MUCUS, URINE: NONE SEEN /LPF
Nitrite, Urine: NEGATIVE
Protein, UA: NEGATIVE
Specific Gravity, UA: 1.025 (ref 1.003–1.035)
Urobilinogen, Urine: 0.2 EU/dL
pH, UA: 6 (ref 4.5–8.0)

## 2018-02-26 LAB — HEMOGLOBIN A1C
Est. Avg. Glucose, WB: 111
Est. Avg. Glucose-calculated: 118
Hemoglobin A1C: 5.5 % (ref 4.0–6.0)

## 2018-02-26 LAB — LIPID PANEL
Chol/HDL Ratio: 2.9 (ref 0.0–4.4)
Cholesterol: 217 mg/dL — ABNORMAL HIGH (ref 100–200)
HDL: 76 mg/dL (ref 65–96)
LDL Cholesterol: 121 mg/dL — ABNORMAL HIGH (ref 0.0–100.0)
LDL/HDL Ratio: 1.6
Triglycerides: 101 mg/dL (ref 0–149)
VLDL: 20.2 mg/dL (ref 5.0–40.0)

## 2018-02-26 LAB — T4, FREE: T4 Free: 1.2 ng/dL (ref 0.82–1.70)

## 2018-02-26 LAB — TSH WITH REFLEX TO FT4: TSH: 5.63 mcIU/mL — ABNORMAL HIGH (ref 0.358–3.740)

## 2018-02-26 LAB — VITAMIN D 25 HYDROXY: Vit D, 25-Hydroxy: 17.9 ng/mL — ABNORMAL LOW (ref 30.0–90.0)

## 2018-05-10 DIAGNOSIS — M25561 Pain in right knee: Secondary | ICD-10-CM | POA: Diagnosis not present

## 2018-05-10 DIAGNOSIS — G8929 Other chronic pain: Secondary | ICD-10-CM | POA: Diagnosis not present

## 2018-05-10 DIAGNOSIS — I10 Essential (primary) hypertension: Secondary | ICD-10-CM | POA: Diagnosis not present

## 2018-05-10 DIAGNOSIS — M25551 Pain in right hip: Secondary | ICD-10-CM | POA: Diagnosis not present

## 2018-05-10 DIAGNOSIS — K219 Gastro-esophageal reflux disease without esophagitis: Secondary | ICD-10-CM | POA: Diagnosis not present

## 2018-05-10 DIAGNOSIS — Z1211 Encounter for screening for malignant neoplasm of colon: Secondary | ICD-10-CM | POA: Diagnosis not present

## 2018-05-10 DIAGNOSIS — E559 Vitamin D deficiency, unspecified: Secondary | ICD-10-CM | POA: Diagnosis not present

## 2018-05-10 DIAGNOSIS — Z0100 Encounter for examination of eyes and vision without abnormal findings: Secondary | ICD-10-CM | POA: Diagnosis not present

## 2018-05-10 DIAGNOSIS — M5441 Lumbago with sciatica, right side: Secondary | ICD-10-CM | POA: Diagnosis not present

## 2018-05-18 DIAGNOSIS — Z1211 Encounter for screening for malignant neoplasm of colon: Secondary | ICD-10-CM | POA: Diagnosis not present

## 2018-05-19 DIAGNOSIS — M5416 Radiculopathy, lumbar region: Secondary | ICD-10-CM | POA: Diagnosis not present

## 2018-05-19 DIAGNOSIS — M1611 Unilateral primary osteoarthritis, right hip: Secondary | ICD-10-CM | POA: Diagnosis not present

## 2018-05-19 DIAGNOSIS — M545 Low back pain: Secondary | ICD-10-CM | POA: Diagnosis not present

## 2018-05-19 DIAGNOSIS — M25551 Pain in right hip: Secondary | ICD-10-CM | POA: Diagnosis not present

## 2018-05-19 DIAGNOSIS — M25561 Pain in right knee: Secondary | ICD-10-CM | POA: Diagnosis not present

## 2018-06-02 DIAGNOSIS — M1611 Unilateral primary osteoarthritis, right hip: Secondary | ICD-10-CM | POA: Diagnosis not present

## 2018-06-02 DIAGNOSIS — Z7189 Other specified counseling: Secondary | ICD-10-CM | POA: Diagnosis not present

## 2018-07-18 IMAGING — CT CT ABD-PELV W/O CM
2 of 4 series · 17 of 46 positions shown, 19 images · non-contrast
Comparison: CT scan of December 07, 2013.

CLINICAL DATA: Generalized abdominal pain for 1 year.

EXAM:
CT ABDOMEN AND PELVIS WITHOUT CONTRAST
TECHNIQUE: Multidetector CT imaging of the abdomen and pelvis was performed
following the standard protocol without IV contrast.

[Series 2: abd pel wo · axial · 0.72mm/px · z∈[-837,-417]mm · 14 of 93 slices shown, 16 images]
[im 5/93  soft-tissue]
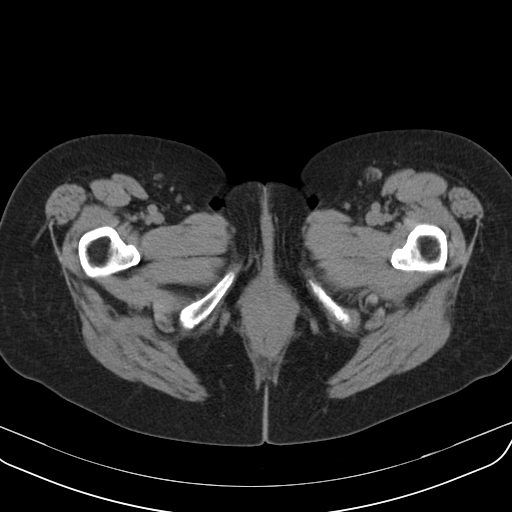
[im 5/93  bone]
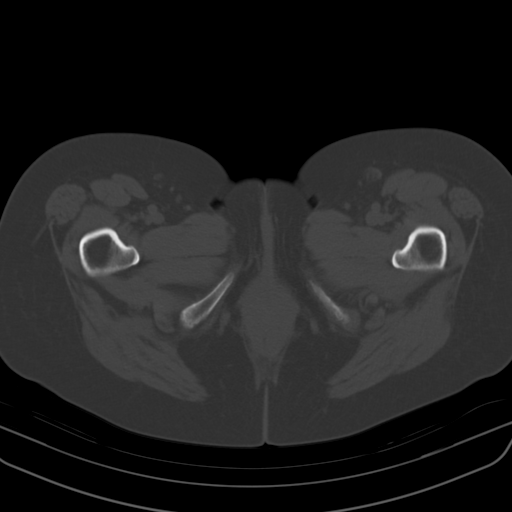
[im 13/93  soft-tissue]
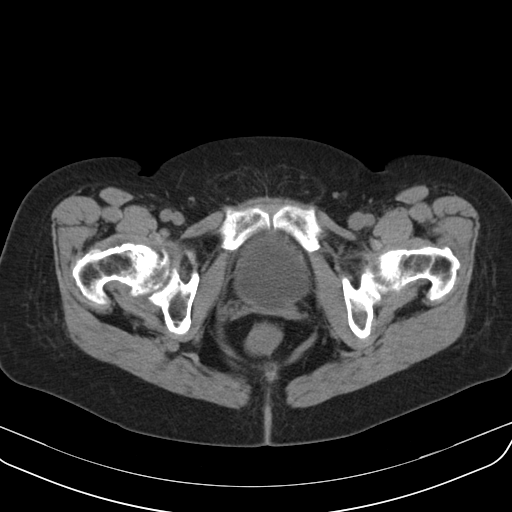
[im 17/93  soft-tissue]
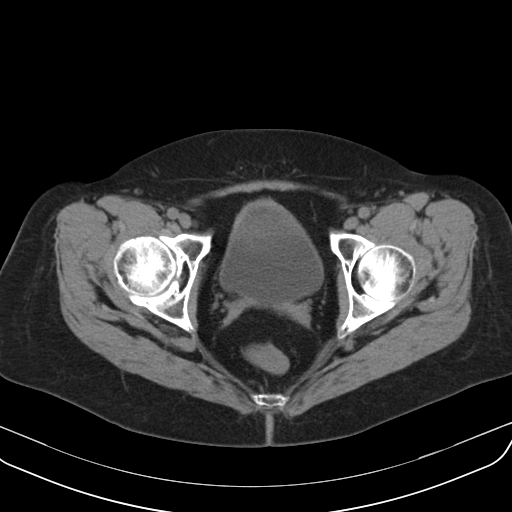
[im 25/93  soft-tissue]
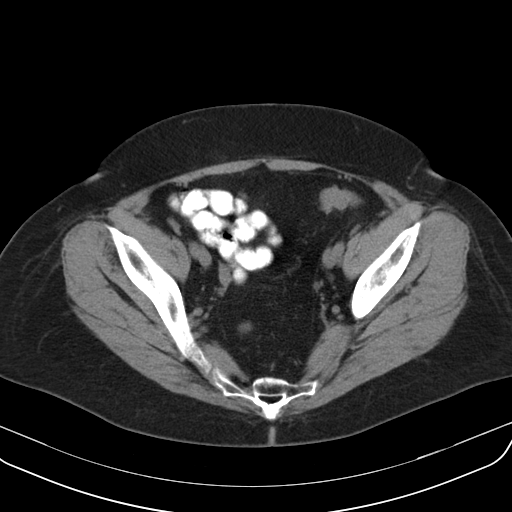
[im 33/93  soft-tissue]
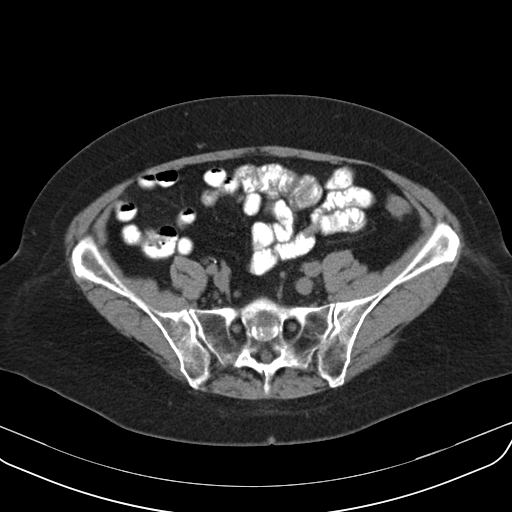
[im 37/93  soft-tissue]
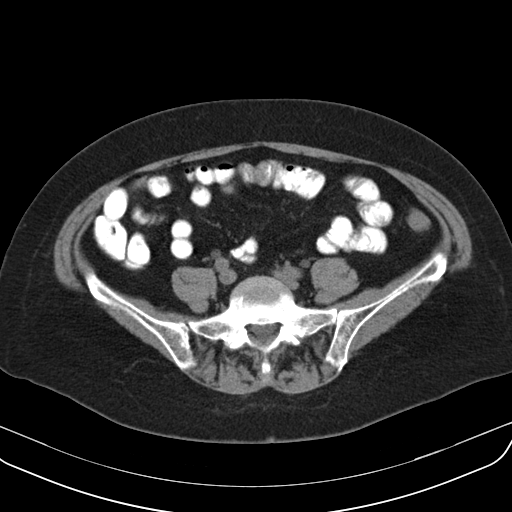
[im 45/93  soft-tissue]
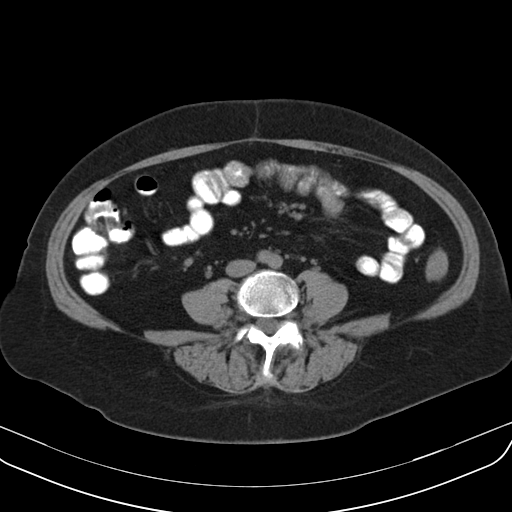
[im 49/93  soft-tissue]
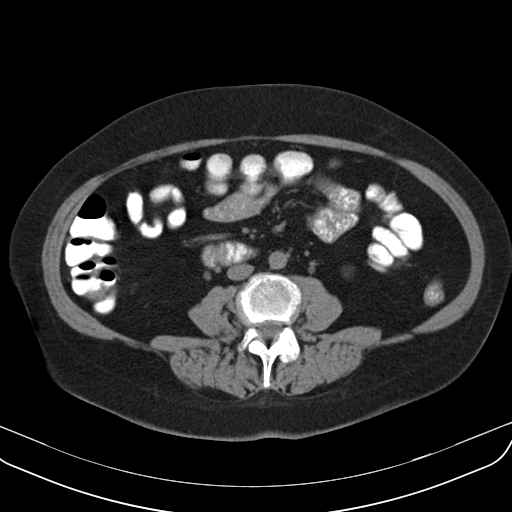
[im 57/93  soft-tissue]
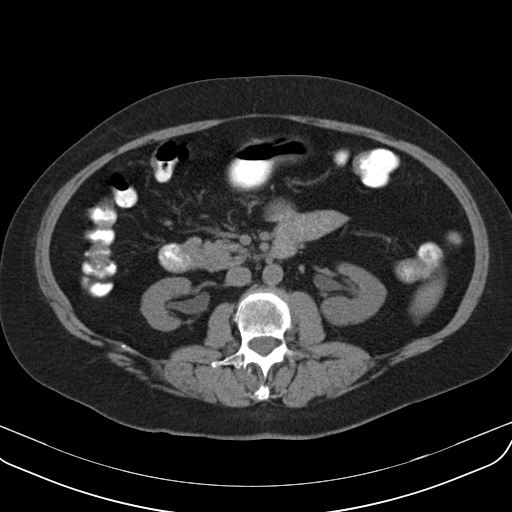
[im 57/93  bone]
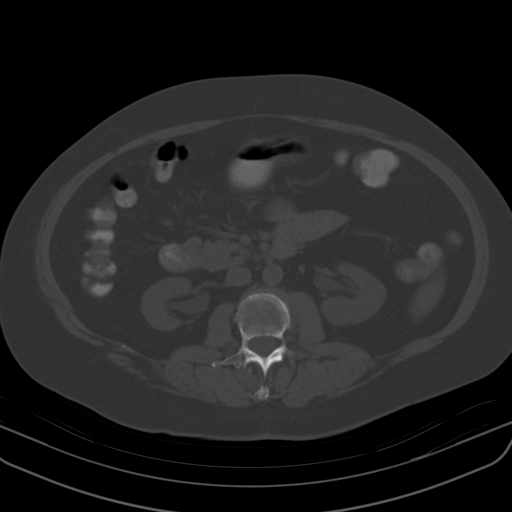
[im 61/93  soft-tissue]
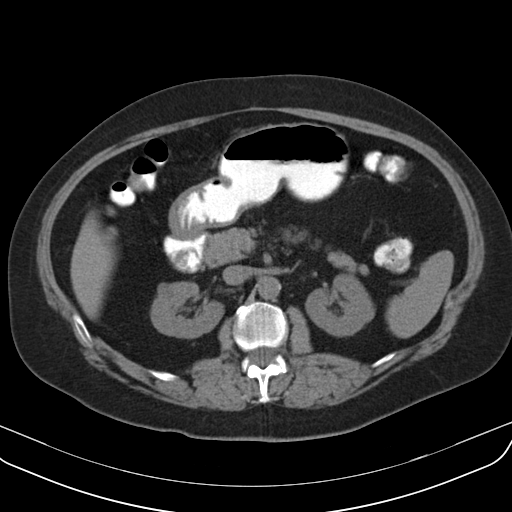
[im 69/93  soft-tissue]
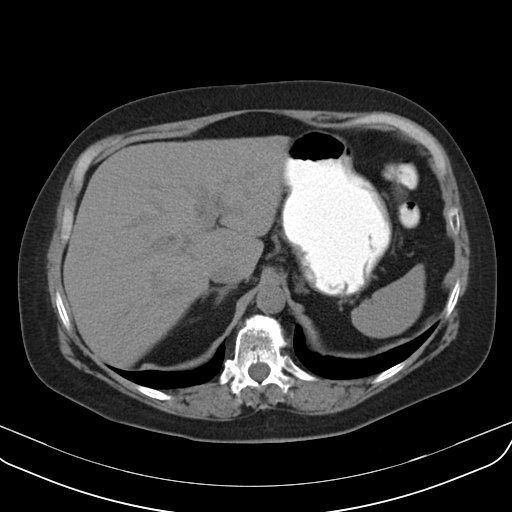
[im 77/93  soft-tissue]
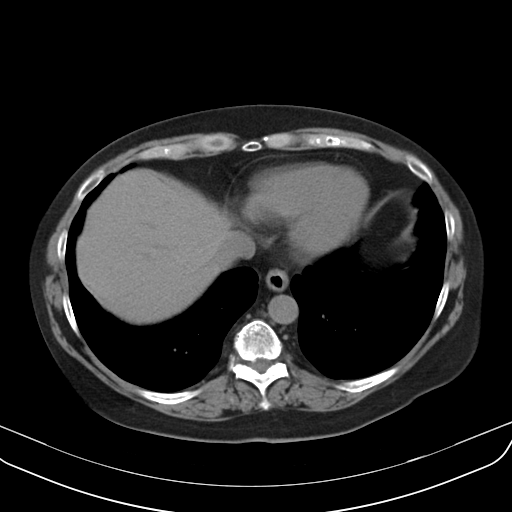
[im 81/93  soft-tissue]
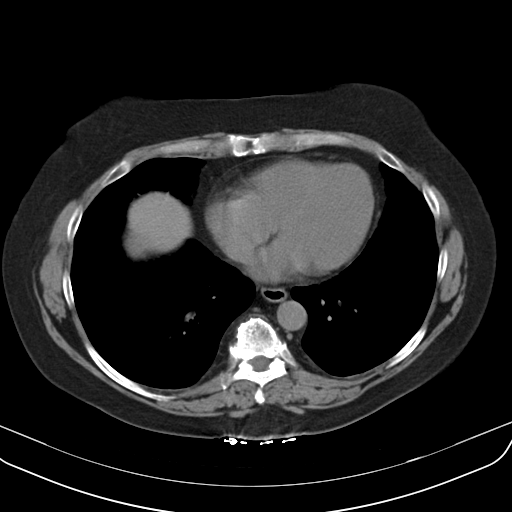
[im 89/93  soft-tissue]
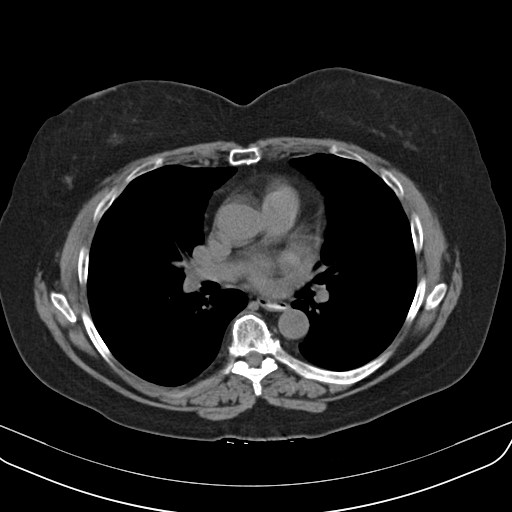

[Series 602: coronal · coronal · 0.92mm/px · 3 of 85 slices shown]
[im 29/85  soft-tissue]
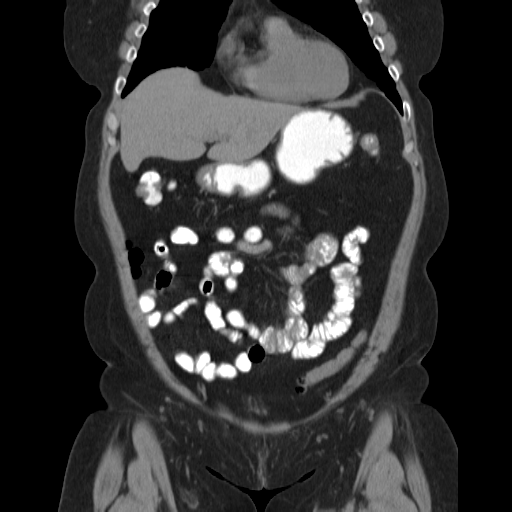
[im 38/85  soft-tissue]
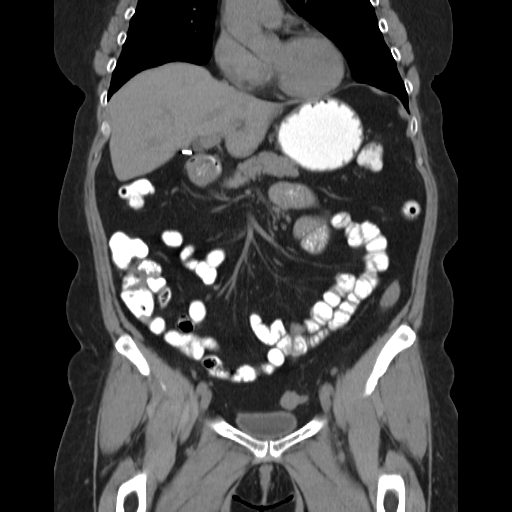
[im 47/85  soft-tissue]
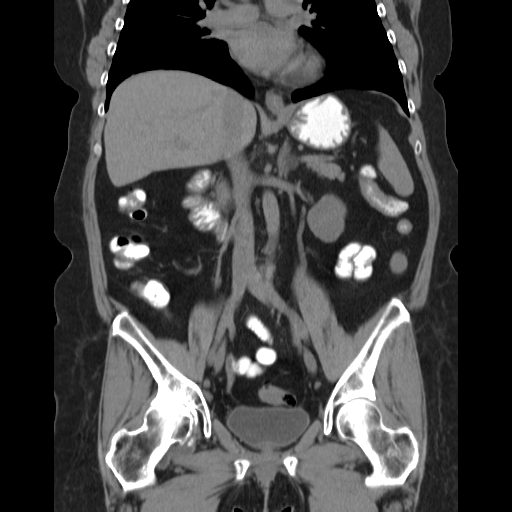

[17 of 46 positions shown; findings below may reference images not displayed]

FINDINGS: Degenerative joint disease of the right hip is noted. Stable 4 mm
nodule is noted posteriorly in left lower lobe.

Status post cholecystectomy. No focal abnormality is noted in the
liver, spleen or pancreas on these unenhanced images. Adrenal glands
and kidneys are unremarkable. No hydronephrosis or renal obstruction
is noted. No renal or ureteral calculi are noted. There is no
evidence of bowel obstruction. No abnormal fluid collection is
noted. Sigmoid diverticulosis is noted without inflammation. Status
post hysterectomy. Right ovary appears normal. Left ovary is not
visualized. Urinary bladder appears normal. No significant
adenopathy is noted.
IMPRESSION: Stable 4 mm nodule seen in left lower lobe. This can be considered
benign at this point with no further follow-up required.

Sigmoid diverticulosis without inflammation.

No acute abnormality seen in the abdomen or pelvis.

## 2018-10-22 ENCOUNTER — Emergency Department: Admit: 2018-10-23 | Payer: MEDICARE | Primary: Family Medicine

## 2018-10-22 DIAGNOSIS — R609 Edema, unspecified: Secondary | ICD-10-CM

## 2018-10-22 NOTE — ED Notes (Signed)
I have reviewed discharge instructions with the patient.  The patient verbalized understanding.    Patient left ED via Discharge Method: ambulatory to Home with family/friend.    Opportunity for questions and clarification provided.       Patient given 0 scripts.         To continue your aftercare when you leave the hospital, you may receive an automated call from our care team to check in on how you are doing.  This is a free service and part of our promise to provide the best care and service to meet your aftercare needs." If you have questions, or wish to unsubscribe from this service please call [phone].  Thank you for Choosing our [facility].

## 2018-10-22 NOTE — ED Notes (Signed)
Pt c/o swelling all over for several weeks, states she is starting to feel SOB and is having headaches. States she was never officially dx with CHF. Masked on arrival.

## 2018-10-22 NOTE — ED Provider Notes (Signed)
[facility]     Duane BostonDebra Gibson is a 68 y.o. female seen on 10/22/2018 at 9:15 PM [facility] in room ER06/06.    Chief Complaint   Patient presents with   . Swelling   . Shortness of Breath     HPI: 68 year old female presenting to the emergency department complaining of swelling in her lower extremities.  This been present for 2 to 3 months.  She notes bilateral pitting edema. Sometimes she feels like the swelling is in her hands as well.  She denies any shortness of breath, no fevers.  No cough or congestion.  No known history of coronary artery disease or congestive heart failure.  She has no known history of kidney disease, no increased urinary frequency or urgency.  She presents to the ER today because she is concerned about the possibility of congestive heart failure.    Historian: patient    REVIEW OF SYSTEMS     Review of Systems   Constitutional: Negative for fever.   HENT: Negative.    Eyes: Negative.    Respiratory: Negative for cough, chest tightness, shortness of breath and wheezing.    Cardiovascular: Positive for leg swelling. Negative for chest pain.   Gastrointestinal: Negative for abdominal distention, abdominal pain, constipation, diarrhea and vomiting.   Endocrine: Negative.    Genitourinary: Negative for dysuria, flank pain, frequency and urgency.   Neurological: Negative for dizziness, syncope and headaches.   Psychiatric/Behavioral: Negative.    All other systems reviewed and are negative.      PAST MEDICAL HISTORY     Past Medical History:   Diagnosis Date   . Anxiety    . GERD (gastroesophageal reflux disease)    . HTN (hypertension)    . Neuropathy      Past Surgical History:   Procedure Laterality Date   . HX APPENDECTOMY     . HX CHOLECYSTECTOMY     . HX HIP REPLACEMENT Right    . HX HYSTERECTOMY     . HX PARTIAL THYROIDECTOMY     . HX TONSILLECTOMY       Social History     Socioeconomic History   . Marital status: WIDOWED     Spouse name:  Not on file   . Number of children: Not on file   . Years of education: Not on file   . Highest education level: Not on file   Tobacco Use   . Smoking status: Never Smoker   . Smokeless tobacco: Never Used   Substance and Sexual Activity   . Alcohol use: Not Currently     Prior to Admission Medications   Prescriptions Last Dose Informant Patient Reported? Taking?   KELP, IODINE, PO   Yes Yes   Sig: Take  by mouth.   acetaminophen (Tylenol Extra Strength) 500 mg tablet   Yes Yes   Sig: Take  by mouth.   amLODIPine (NORVASC) 5 mg tablet   Yes Yes   Sig: Take 5 mg by mouth daily.   aspirin delayed-release 81 mg tablet   Yes Yes   Sig: Take  by mouth daily.   biotin 5,000 mcg subl   Yes Yes   Sig: by SubLINGual route.   cholecalciferol (VITAMIN D3) (1000 Units /25 mcg) tablet   Yes Yes   Sig: Take  by mouth daily.   clonazePAM (KlonoPIN) 0.5 mg tablet   Yes Yes   Sig: Take  by mouth  two (2) times daily as needed for Anxiety.   cyclobenzaprine (FLEXERIL) 5 mg tablet   Yes Yes   Sig: Take 5 mg by mouth.   diclofenac EC (VOLTAREN) 75 mg EC tablet   Yes Yes   Sig: Take  by mouth.   gabapentin (NEURONTIN) 300 mg capsule   Yes Yes   Sig: Take 300 mg by mouth three (3) times daily.   omeprazole (PRILOSEC) 40 mg capsule   Yes Yes   Sig: Take 40 mg by mouth daily.   traMADoL (ULTRAM) 50 mg tablet   Yes Yes   Sig: Take 50 mg by mouth every six (6) hours as needed for Pain.   zinc 50 mg tab tablet   Yes Yes   Sig: Take  by mouth daily.      Facility-Administered Medications: None     No Known Allergies     PHYSICAL EXAM       Vitals:    10/22/18 2019   BP: 135/76   Pulse: 89   Resp: 18   Temp: 98.4 F (36.9 C)   SpO2: 94%    Vital signs were reviewed.     Physical Exam  Vitals signs reviewed.   Constitutional:       General: She is not in acute distress.     Appearance: She is well-developed. She is not diaphoretic.   HENT:      Head: Normocephalic and atraumatic.   Eyes:      Pupils: Pupils are equal, round, and reactive to  light.   Neck:      Musculoskeletal: Normal range of motion and neck supple.   Cardiovascular:      Rate and Rhythm: Normal rate and regular rhythm.      Heart sounds: Normal heart sounds. No murmur. No friction rub. No gallop.    Pulmonary:      Effort: Pulmonary effort is normal. No respiratory distress.      Breath sounds: Normal breath sounds. No stridor. No wheezing.   Abdominal:      General: Bowel sounds are normal. There is no distension.      Palpations: Abdomen is soft. There is no mass.      Tenderness: There is no abdominal tenderness. There is no guarding or rebound.   Musculoskeletal: Normal range of motion.         General: No tenderness or deformity.      Right lower leg: Edema (1+) present.      Left lower leg: Edema (1+) present.   Skin:     General: Skin is warm and dry.      Capillary Refill: Capillary refill takes less than 2 seconds.      Findings: No erythema or rash.   Neurological:      Mental Status: She is alert and oriented to person, place, and time.      Sensory: No sensory deficit.      Comments: No focal neuro deficits   Psychiatric:         Behavior: Behavior normal.          MEDICAL DECISION MAKING     MDM  Procedures    ED Course:    9:43 PM  BNP is normal, chest x-ray is clear, renal function is normal.  I suspect this most likely represents dependent edema.  I recommended elevation and compression hose.  I do not think that diuretics are indicated at this point they might be considered if  conservative measures are not successful.  Recommended follow-up with her primary care doctor.  She is comfortable with this plan.    Disposition: Discharge home  Diagnosis: Bilateral dependent edema  ____________________________________________________________________  A portion of this note was generated using voice recognition dictation software. While the note has been reviewed for accuracy, please note certain words and phrases may not be transcribed as intended and some grammatical and/or  typographical errors may be present.

## 2018-10-22 NOTE — ED Notes (Signed)
I have reviewed discharge instructions with the patient.  The patient verbalized understanding.    Patient left ED via Discharge Method: ambulatory to Home with family/friend.    Opportunity for questions and clarification provided.       Patient given 0 scripts.         To continue your aftercare when you leave the hospital, you may receive an automated call from our care team to check in on how you are doing.  This is a free service and part of our promise to provide the best care and service to meet your aftercare needs.??? If you have questions, or wish to unsubscribe from this service please call [phone].  Thank you for Choosing our  Emergency Department.

## 2018-10-22 NOTE — ED Provider Notes (Signed)
Julia     Brayley Gibson is a 68 y.o. female seen on 10/22/2018 at 9:15 PM in the [facility] in room ER06/06.    Chief Complaint   Patient presents with   ??? Swelling   ??? Shortness of Breath     HPI: 68 year old female presenting to the emergency department complaining of swelling in her lower extremities.  This been present for 2 to 3 months.  She notes bilateral pitting edema. Sometimes she feels like the swelling is in her hands as well.  She denies any shortness of breath, no fevers.  No cough or congestion.  No known history of coronary artery disease or congestive heart failure.  She has no known history of kidney disease, no increased urinary frequency or urgency.  She presents to the ER today because she is concerned about the possibility of congestive heart failure.    Historian: patient    REVIEW OF SYSTEMS     Review of Systems   Constitutional: Negative for fever.   HENT: Negative.    Eyes: Negative.    Respiratory: Negative for cough, chest tightness, shortness of breath and wheezing.    Cardiovascular: Positive for leg swelling. Negative for chest pain.   Gastrointestinal: Negative for abdominal distention, abdominal pain, constipation, diarrhea and vomiting.   Endocrine: Negative.    Genitourinary: Negative for dysuria, flank pain, frequency and urgency.   Neurological: Negative for dizziness, syncope and headaches.   Psychiatric/Behavioral: Negative.    All other systems reviewed and are negative.      PAST MEDICAL HISTORY     Past Medical History:   Diagnosis Date   ??? Anxiety    ??? GERD (gastroesophageal reflux disease)    ??? HTN (hypertension)    ??? Neuropathy      Past Surgical History:   Procedure Laterality Date   ??? HX APPENDECTOMY     ??? HX CHOLECYSTECTOMY     ??? HX HIP REPLACEMENT Right    ??? HX HYSTERECTOMY     ??? HX PARTIAL THYROIDECTOMY     ??? HX TONSILLECTOMY       Social History     Socioeconomic History   ??? Marital status: WIDOWED      Spouse name: Not on file   ??? Number of children: Not on file   ??? Years of education: Not on file   ??? Highest education level: Not on file   Tobacco Use   ??? Smoking status: Never Smoker   ??? Smokeless tobacco: Never Used   Substance and Sexual Activity   ??? Alcohol use: Not Currently     Prior to Admission Medications   Prescriptions Last Dose Informant Patient Reported? Taking?   KELP, IODINE, PO   Yes Yes   Sig: Take  by mouth.   acetaminophen (Tylenol Extra Strength) 500 mg tablet   Yes Yes   Sig: Take  by mouth.   amLODIPine (NORVASC) 5 mg tablet   Yes Yes   Sig: Take 5 mg by mouth daily.   aspirin delayed-release 81 mg tablet   Yes Yes   Sig: Take  by mouth daily.   biotin 5,000 mcg subl   Yes Yes   Sig: by SubLINGual route.   cholecalciferol (VITAMIN D3) (1000 Units /25 mcg) tablet   Yes Yes   Sig: Take  by mouth daily.   clonazePAM (KlonoPIN) 0.5 mg tablet   Yes Yes   Sig: Take  by mouth  two (2) times daily as needed for Anxiety.   cyclobenzaprine (FLEXERIL) 5 mg tablet   Yes Yes   Sig: Take 5 mg by mouth.   diclofenac EC (VOLTAREN) 75 mg EC tablet   Yes Yes   Sig: Take  by mouth.   gabapentin (NEURONTIN) 300 mg capsule   Yes Yes   Sig: Take 300 mg by mouth three (3) times daily.   omeprazole (PRILOSEC) 40 mg capsule   Yes Yes   Sig: Take 40 mg by mouth daily.   traMADoL (ULTRAM) 50 mg tablet   Yes Yes   Sig: Take 50 mg by mouth every six (6) hours as needed for Pain.   zinc 50 mg tab tablet   Yes Yes   Sig: Take  by mouth daily.      Facility-Administered Medications: None     No Known Allergies     PHYSICAL EXAM       Vitals:    10/22/18 2019   BP: 135/76   Pulse: 89   Resp: 18   Temp: 98.4 ??F (36.9 ??C)   SpO2: 94%    Vital signs were reviewed.     Physical Exam  Vitals signs reviewed.   Constitutional:       General: She is not in acute distress.     Appearance: She is well-developed. She is not diaphoretic.   HENT:      Head: Normocephalic and atraumatic.   Eyes:       Pupils: Pupils are equal, round, and reactive to light.   Neck:      Musculoskeletal: Normal range of motion and neck supple.   Cardiovascular:      Rate and Rhythm: Normal rate and regular rhythm.      Heart sounds: Normal heart sounds. No murmur. No friction rub. No gallop.    Pulmonary:      Effort: Pulmonary effort is normal. No respiratory distress.      Breath sounds: Normal breath sounds. No stridor. No wheezing.   Abdominal:      General: Bowel sounds are normal. There is no distension.      Palpations: Abdomen is soft. There is no mass.      Tenderness: There is no abdominal tenderness. There is no guarding or rebound.   Musculoskeletal: Normal range of motion.         General: No tenderness or deformity.      Right lower leg: Edema (1+) present.      Left lower leg: Edema (1+) present.   Skin:     General: Skin is warm and dry.      Capillary Refill: Capillary refill takes less than 2 seconds.      Findings: No erythema or rash.   Neurological:      Mental Status: She is alert and oriented to person, place, and time.      Sensory: No sensory deficit.      Comments: No focal neuro deficits   Psychiatric:         Behavior: Behavior normal.          MEDICAL DECISION MAKING     MDM  Procedures    ED Course:    9:43 PM  BNP is normal, chest x-ray is clear, renal function is normal.  I suspect this most likely represents dependent edema.  I recommended elevation and compression hose.  I do not think that diuretics are indicated at this point they might be considered if  conservative measures are not successful.  Recommended follow-up with her primary care doctor.  She is comfortable with this plan.    Disposition: Discharge home  Diagnosis: Bilateral dependent edema  ____________________________________________________________________  A portion of this note was generated using voice recognition dictation software. While the note has been reviewed for accuracy, please note  certain words and phrases may not be transcribed as intended and some grammatical and/or typographical errors may be present.

## 2018-10-22 NOTE — ED Triage Notes (Signed)
Pt c/o swelling all over for several weeks, states she is starting to feel SOB and is having headaches. States she was never officially dx with CHF. Masked on arrival.

## 2018-10-23 ENCOUNTER — Inpatient Hospital Stay
Admit: 2018-10-23 | Discharge: 2018-10-23 | Disposition: A | Discharge status: Home / Self Care | Payer: MEDICARE | Attending: Emergency Medicine

## 2018-10-23 LAB — PROBNP, N-TERMINAL: BNP: 40 PG/ML (ref 5–125)

## 2018-10-23 LAB — CBC WITH AUTO DIFFERENTIAL
Basophils %: 1 % (ref 0.0–2.0)
Basophils Absolute: 0.1 10*3/uL (ref 0.0–0.2)
Eosinophils %: 5 % (ref 0.5–7.8)
Eosinophils Absolute: 0.4 10*3/uL (ref 0.0–0.8)
Granulocyte Absolute Count: 0 10*3/uL (ref 0.0–0.5)
Hematocrit: 38.7 % (ref 35.8–46.3)
Hemoglobin: 12.8 g/dL (ref 11.7–15.4)
Immature Granulocytes: 0 % (ref 0.0–5.0)
Lymphocytes %: 40 % (ref 13–44)
Lymphocytes Absolute: 3.2 10*3/uL (ref 0.5–4.6)
MCH: 31 PG (ref 26.1–32.9)
MCHC: 33.1 g/dL (ref 31.4–35.0)
MCV: 93.7 FL (ref 79.6–97.8)
MPV: 9.8 FL (ref 9.4–12.3)
Monocytes %: 10 % (ref 4.0–12.0)
Monocytes Absolute: 0.8 10*3/uL (ref 0.1–1.3)
NRBC Absolute: 0 10*3/uL (ref 0.0–0.2)
Neutrophils %: 44 % (ref 43–78)
Neutrophils Absolute: 3.5 10*3/uL (ref 1.7–8.2)
Platelets: 320 10*3/uL (ref 150–450)
RBC: 4.13 M/uL (ref 4.05–5.2)
RDW: 13.2 % (ref 11.9–14.6)
WBC: 8 10*3/uL (ref 4.3–11.1)

## 2018-10-23 LAB — COMPREHENSIVE METABOLIC PANEL
ALT: 32 U/L (ref 12–65)
AST: 24 U/L (ref 15–37)
Albumin/Globulin Ratio: 1.1 — ABNORMAL LOW (ref 1.2–3.5)
Albumin: 3.9 g/dL (ref 3.2–4.6)
Alkaline Phosphatase: 153 U/L — ABNORMAL HIGH (ref 50–136)
Anion Gap: 7 mmol/L (ref 7–16)
BUN: 13 MG/DL (ref 8–23)
CO2: 28 mmol/L (ref 21–32)
Calcium: 8.7 MG/DL (ref 8.3–10.4)
Chloride: 107 mmol/L (ref 98–107)
Creatinine: 1.07 MG/DL — ABNORMAL HIGH (ref 0.6–1.0)
EGFR IF NonAfrican American: 55 mL/min/{1.73_m2} — ABNORMAL LOW (ref >60)
GFR African American: >60 mL/min/{1.73_m2} (ref >60)
Globulin: 3.6 g/dL — ABNORMAL HIGH (ref 2.3–3.5)
Glucose: 97 mg/dL (ref 65–100)
Potassium: 3.7 mmol/L (ref 3.5–5.1)
Sodium: 142 mmol/L (ref 136–145)
Total Bilirubin: 0.4 MG/DL (ref 0.2–1.1)
Total Protein: 7.5 g/dL (ref 6.3–8.2)

## 2018-10-23 LAB — EKG 12-LEAD
Atrial Rate: 84 {beats}/min
Diagnosis: NORMAL
P Axis: 30 degrees
P-R Interval: 128 ms
Q-T Interval: 368 ms
QRS Duration: 74 ms
QTc Calculation (Bazett): 434 ms
R Axis: 53 degrees
T Axis: 52 degrees
Ventricular Rate: 84 {beats}/min

## 2018-10-23 LAB — METABOLIC PANEL, COMPREHENSIVE
A-G Ratio: 1.1 — ABNORMAL LOW (ref 1.2–3.5)
ALT (SGPT): 32 U/L (ref 12–65)
AST (SGOT): 24 U/L (ref 15–37)
Albumin: 3.9 g/dL (ref 3.2–4.6)
Alk. phosphatase: 153 U/L — ABNORMAL HIGH (ref 50–136)
Anion gap: 7 mmol/L (ref 7–16)
BUN: 13 MG/DL (ref 8–23)
Bilirubin, total: 0.4 MG/DL (ref 0.2–1.1)
CO2: 28 mmol/L (ref 21–32)
Calcium: 8.7 MG/DL (ref 8.3–10.4)
Chloride: 107 mmol/L (ref 98–107)
Creatinine: 1.07 MG/DL — ABNORMAL HIGH (ref 0.6–1.0)
GFR est AA: >60 mL/min/{1.73_m2} (ref >60)
GFR est non-AA: 55 mL/min/{1.73_m2} — ABNORMAL LOW (ref >60)
Globulin: 3.6 g/dL — ABNORMAL HIGH (ref 2.3–3.5)
Glucose: 97 mg/dL (ref 65–100)
Potassium: 3.7 mmol/L (ref 3.5–5.1)
Protein, total: 7.5 g/dL (ref 6.3–8.2)
Sodium: 142 mmol/L (ref 136–145)

## 2018-10-23 LAB — EKG, 12 LEAD, INITIAL
Atrial Rate: 84 {beats}/min
Calculated P Axis: 30 degrees
Calculated R Axis: 53 degrees
Calculated T Axis: 52 degrees
Diagnosis: NORMAL
P-R Interval: 128 ms
Q-T Interval: 368 ms
QRS Duration: 74 ms
QTC Calculation (Bezet): 434 ms
Ventricular Rate: 84 {beats}/min

## 2018-10-23 LAB — NT-PRO BNP: NT pro-BNP: 40 PG/ML (ref 5–125)

## 2018-10-23 LAB — CBC WITH AUTOMATED DIFF
ABS. BASOPHILS: 0.1 10*3/uL (ref 0.0–0.2)
ABS. EOSINOPHILS: 0.4 10*3/uL (ref 0.0–0.8)
ABS. IMM. GRANS.: 0 10*3/uL (ref 0.0–0.5)
ABS. LYMPHOCYTES: 3.2 10*3/uL (ref 0.5–4.6)
ABS. MONOCYTES: 0.8 10*3/uL (ref 0.1–1.3)
ABS. NEUTROPHILS: 3.5 10*3/uL (ref 1.7–8.2)
ABSOLUTE NRBC: 0 10*3/uL (ref 0.0–0.2)
BASOPHILS: 1 % (ref 0.0–2.0)
EOSINOPHILS: 5 % (ref 0.5–7.8)
HCT: 38.7 % (ref 35.8–46.3)
HGB: 12.8 g/dL (ref 11.7–15.4)
IMMATURE GRANULOCYTES: 0 % (ref 0.0–5.0)
LYMPHOCYTES: 40 % (ref 13–44)
MCH: 31 PG (ref 26.1–32.9)
MCHC: 33.1 g/dL (ref 31.4–35.0)
MCV: 93.7 FL (ref 79.6–97.8)
MONOCYTES: 10 % (ref 4.0–12.0)
MPV: 9.8 FL (ref 9.4–12.3)
NEUTROPHILS: 44 % (ref 43–78)
PLATELET: 320 10*3/uL (ref 150–450)
RBC: 4.13 M/uL (ref 4.05–5.2)
RDW: 13.2 % (ref 11.9–14.6)
WBC: 8 10*3/uL (ref 4.3–11.1)
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
## 2019-10-01 ENCOUNTER — Telehealth: Payer: Self-pay | Admitting: Family

## 2019-10-01 NOTE — Telephone Encounter (Signed)
-----   Message from Salvatore Marvel sent at 09/28/2019 10:06 AM EDT ----- Regarding: remove pcp Pt not seen in 3+ years. Thank you!
